# Patient Record
Sex: Male | Born: 1990 | Race: Black or African American | Hispanic: No | Marital: Single | Smoking: Current every day smoker
Health system: Southern US, Community
[De-identification: ages and names within clinical notes are randomized; demographics above are authoritative.]

## PROBLEM LIST (undated history)

## (undated) DIAGNOSIS — K3184 Gastroparesis: Secondary | ICD-10-CM

## (undated) DIAGNOSIS — E119 Type 2 diabetes mellitus without complications: Secondary | ICD-10-CM

## (undated) HISTORY — PX: SKIN GRAFT: SHX250

## (undated) HISTORY — PX: CHOLECYSTECTOMY: SHX55

---

## 2016-08-30 ENCOUNTER — Encounter (HOSPITAL_COMMUNITY): Payer: Self-pay | Admitting: *Deleted

## 2016-08-30 ENCOUNTER — Emergency Department (HOSPITAL_COMMUNITY)
Admission: EM | Admit: 2016-08-30 | Discharge: 2016-08-30 | Disposition: A | Discharge status: Home / Self Care | Payer: Federal, State, Local not specified - PPO | Attending: Emergency Medicine | Admitting: Emergency Medicine

## 2016-08-30 DIAGNOSIS — E0843 Diabetes mellitus due to underlying condition with diabetic autonomic (poly)neuropathy: Secondary | ICD-10-CM | POA: Diagnosis not present

## 2016-08-30 DIAGNOSIS — K3184 Gastroparesis: Secondary | ICD-10-CM

## 2016-08-30 DIAGNOSIS — R112 Nausea with vomiting, unspecified: Secondary | ICD-10-CM | POA: Diagnosis not present

## 2016-08-30 DIAGNOSIS — F17210 Nicotine dependence, cigarettes, uncomplicated: Secondary | ICD-10-CM | POA: Insufficient documentation

## 2016-08-30 DIAGNOSIS — R109 Unspecified abdominal pain: Secondary | ICD-10-CM | POA: Diagnosis present

## 2016-08-30 HISTORY — DX: Type 2 diabetes mellitus without complications: E11.9

## 2016-08-30 LAB — COMPREHENSIVE METABOLIC PANEL
ALBUMIN: 4.6 g/dL (ref 3.5–5.0)
ALK PHOS: 72 U/L (ref 38–126)
ALT: 15 U/L — AB (ref 17–63)
AST: 22 U/L (ref 15–41)
Anion gap: 11 (ref 5–15)
BILIRUBIN TOTAL: 1.3 mg/dL — AB (ref 0.3–1.2)
BUN: 9 mg/dL (ref 6–20)
CO2: 25 mmol/L (ref 22–32)
CREATININE: 0.91 mg/dL (ref 0.61–1.24)
Calcium: 9.6 mg/dL (ref 8.9–10.3)
Chloride: 97 mmol/L — ABNORMAL LOW (ref 101–111)
GFR calc Af Amer: >60 mL/min (ref >60)
GLUCOSE: 239 mg/dL — AB (ref 65–99)
Potassium: 3.7 mmol/L (ref 3.5–5.1)
Sodium: 133 mmol/L — ABNORMAL LOW (ref 135–145)
TOTAL PROTEIN: 7.6 g/dL (ref 6.5–8.1)

## 2016-08-30 LAB — URINALYSIS, ROUTINE W REFLEX MICROSCOPIC
BILIRUBIN URINE: NEGATIVE
GLUCOSE, UA: 150 mg/dL — AB
Hgb urine dipstick: NEGATIVE
KETONES UR: 5 mg/dL — AB
LEUKOCYTES UA: NEGATIVE
NITRITE: NEGATIVE
PH: 6 (ref 5.0–8.0)
PROTEIN: NEGATIVE mg/dL
Specific Gravity, Urine: 1.004 — ABNORMAL LOW (ref 1.005–1.030)

## 2016-08-30 LAB — CBC
HCT: 40.4 % (ref 39.0–52.0)
Hemoglobin: 14.3 g/dL (ref 13.0–17.0)
MCH: 26.3 pg (ref 26.0–34.0)
MCHC: 35.4 g/dL (ref 30.0–36.0)
MCV: 74.3 fL — ABNORMAL LOW (ref 78.0–100.0)
PLATELETS: 242 10*3/uL (ref 150–400)
RBC: 5.44 MIL/uL (ref 4.22–5.81)
RDW: 13.7 % (ref 11.5–15.5)
WBC: 8.1 10*3/uL (ref 4.0–10.5)

## 2016-08-30 LAB — CBG MONITORING, ED: Glucose-Capillary: 231 mg/dL — ABNORMAL HIGH (ref 65–99)

## 2016-08-30 LAB — LIPASE, BLOOD: Lipase: <10 U/L — ABNORMAL LOW (ref 11–51)

## 2016-08-30 MED ORDER — METOCLOPRAMIDE HCL 5 MG/ML IJ SOLN
10.0000 mg | Freq: Once | INTRAMUSCULAR | Status: AC
  Administered 2016-08-30: 10 mg via INTRAVENOUS
  Filled 2016-08-30: qty 2

## 2016-08-30 MED ORDER — HALOPERIDOL LACTATE 5 MG/ML IJ SOLN
2.5000 mg | Freq: Once | INTRAMUSCULAR | Status: AC
  Administered 2016-08-30: 2.5 mg via INTRAVENOUS
  Filled 2016-08-30: qty 1

## 2016-08-30 MED ORDER — PROMETHAZINE HCL 25 MG PO TABS: 25.0000 mg | ORAL_TABLET | Freq: Four times a day (QID) | ORAL | 0 refills | Status: DC | PRN

## 2016-08-30 MED ORDER — SODIUM CHLORIDE 0.9 % IV BOLUS (SEPSIS)
1000.0000 mL | Freq: Once | INTRAVENOUS | Status: AC
  Administered 2016-08-30: 1000 mL via INTRAVENOUS

## 2016-08-30 MED ORDER — SODIUM CHLORIDE 0.9 % IV BOLUS (SEPSIS)
1000.0000 mL | Freq: Once | INTRAVENOUS | Status: AC
Start: 2016-08-30 — End: 2016-08-30
  Administered 2016-08-30: 1000 mL via INTRAVENOUS

## 2016-08-30 MED ORDER — ONDANSETRON 4 MG PO TBDP
4.0000 mg | ORAL_TABLET | Freq: Once | ORAL | Status: AC | PRN
  Administered 2016-08-30: 4 mg via ORAL

## 2016-08-30 MED ORDER — ONDANSETRON 4 MG PO TBDP
ORAL_TABLET | ORAL | Status: AC
  Filled 2016-08-30: qty 1

## 2016-08-30 NOTE — ED Notes (Signed)
Pt verbalized understanding of d/c instructions and has no further questions. Pt stable and NAD. VSS.  

## 2016-08-30 NOTE — ED Triage Notes (Addendum)
Pt c/o generalized abd pain onset x 2 days, pt reports n/v with x 30 episodes of vomiting in the last 24 hrs, pt denies diarrhea, pt has type 1 Diabetes , A&O x4

## 2016-08-30 NOTE — ED Provider Notes (Signed)
[facility]PT Provider Note   CSN: [id number] Arrival date & time: 08/30/16  1705     History   Chief Complaint Chief Complaint  Patient presents with  . Abdominal Pain    HPI Christopher Callahan is a 26 y.o. male.  HPI  26 year old male who presents with nausea, vomiting, and abdominal pain. He has a history of type 1 diabetes complicated by recurrent episodes of nausea and vomiting in the past. His mother states that he has been worked up for gastroparesis in the past, but has never gotten the diagnosis of forehead it. States that he has been doing well, and yesterday began to have nausea and vomiting and diffuse generalized abdominal pain that is consistent with his prior episodes. I he had a normal bowel movement today. Has a history of a cholecystectomy. Has not had abdominal distention, melena or hematochezia, or hematemesis. He has not had fevers, chills, dysuria or urinary frequency. He denies any chest pain, difficulty breathing, cough or congestion.  Past Medical History:  Diagnosis Date  . Diabetes mellitus without complication (HCC)     There are no active problems to display for this patient.   Past Surgical History:  Procedure Laterality Date  . CHOLECYSTECTOMY    . SKIN GRAFT         Home Medications    Prior to Admission medications   Medication Sig Start Date End Date Taking? Authorizing Provider  promethazine (PHENERGAN) 25 MG tablet Take 1 tablet (25 mg total) by mouth every 6 (six) hours as needed for nausea or vomiting. 08/30/16   [name], MD    Family History No family history on file.  Social History Social History  Substance Use Topics  . Smoking status: Current Every Day Smoker    Packs/day: 0.30    Types: Cigarettes  . Smokeless tobacco: Never Used  . Alcohol use No     Allergies   Patient has no known allergies.   Review of Systems Review of Systems  Constitutional: Negative for fever.  HENT: Negative for congestion.     Respiratory: Negative for shortness of breath.   Cardiovascular: Negative for chest pain.  Gastrointestinal: Positive for abdominal pain, nausea and vomiting.  Genitourinary: Negative for dysuria and frequency.  Musculoskeletal: Negative for back pain.  Neurological: Negative for syncope.  Hematological: Does not bruise/bleed easily.  Psychiatric/Behavioral: Negative for confusion.  All other systems reviewed and are negative.    Physical Exam Updated Vital Signs BP 140/88 (BP Location: Right Arm)   Pulse (!) 105   Temp 98.5 F (36.9 C) (Oral)   Resp 16   Ht  (1.854 m)   Wt 221 lb (100.2 kg)   SpO2 98%   BMI 29.16 kg/m   Physical Exam Physical Exam  Nursing note and vitals reviewed. Constitutional: non-toxic, appears uncomfortable, sitting on edge of the bed leaning forward Head: Normocephalic and atraumatic.  Mouth/Throat: Oropharynx is clear and dry mucous membranes.  Neck: Normal range of motion. Neck supple.  Cardiovascular: Tachycardic rate and regular rhythm.   Pulmonary/Chest: Effort normal and breath sounds normal.  Abdominal: Soft. There is diffuse generalized tenderness. There is no rebound and no guarding.  Musculoskeletal: Normal range of motion.  Neurological: Alert, no facial droop, fluent speech, moves all extremities symmetrically Skin: Skin is warm and dry.  Psychiatric: Cooperative   ED Treatments / Results  Labs (all labs ordered are listed, but only abnormal results are displayed) Labs Reviewed  LIPASE, BLOOD -  Abnormal; Notable for the following:       Result Value   Lipase <10 (*)    All other components within normal limits  COMPREHENSIVE METABOLIC PANEL - Abnormal; Notable for the following:    Sodium 133 (*)    Chloride 97 (*)    Glucose, Bld 239 (*)    ALT 15 (*)    Total Bilirubin 1.3 (*)    All other components within normal limits  CBC - Abnormal; Notable for the following:    MCV 74.3 (*)    All other components within  normal limits  URINALYSIS, ROUTINE W REFLEX MICROSCOPIC - Abnormal; Notable for the following:    Color, Urine STRAW (*)    Specific Gravity, Urine 1.004 (*)    Glucose, UA 150 (*)    Ketones, ur 5 (*)    All other components within normal limits  CBG MONITORING, ED - Abnormal; Notable for the following:    Glucose-Capillary 231 (*)    All other components within normal limits    EKG  EKG Interpretation  Date/Time:  Friday August 30 2016 19:30:50 EDT Ventricular Rate:  111 PR Interval:    QRS Duration: 84 QT Interval:  333 QTC Calculation: 453 R Axis:   56 Text Interpretation:  Sinus tachycardia Baseline wander in lead(s) V3 V4 no prior EKG  Confirmed by Nihar Klus MD, Baudelia Schroepfer ([id number]) on 08/30/2016 7:43:23 PM       Radiology No results found.  Procedures Procedures (including critical care time)  Medications Ordered in ED Medications  ondansetron (ZOFRAN-ODT) disintegrating tablet 4 mg (4 mg Oral Given 08/30/16 1752)  sodium chloride 0.9 % bolus 1,000 mL (0 mLs Intravenous Stopped 08/30/16 2132)  sodium chloride 0.9 % bolus 1,000 mL (0 mLs Intravenous Stopped 08/30/16 2132)  metoCLOPramide (REGLAN) injection 10 mg (10 mg Intravenous Given 08/30/16 1935)  haloperidol lactate (HALDOL) injection 2.5 mg (2.5 mg Intravenous Given 08/30/16 1939)  sodium chloride 0.9 % bolus 1,000 mL (0 mLs Intravenous Stopped 08/30/16 2232)     Initial Impression / Assessment and Plan / ED Course  I have reviewed the triage vital signs and the nursing notes.  Pertinent labs & imaging results that were available during my care of the patient were reviewed by me and considered in my medical decision making (see chart for details).     Presenting with nausea and vomiting which he states is similar to prior episodes in the past. Likely gastroparesis. He appears dry on exam with sinus tachycardia. Abdomen is soft and nonsurgical, with diffuse tenderness. Blood work with signs of mild dehydration. He does have  hyperglycemia to 239, but without signs of DKA. He is given IV fluids, Reglan, and Haldol. On reevaluation, he feels significantly improved. Repeat abdominal exam is benign. I do not feel that his presentation is consistent with serious intra-abdominal process such as appendicitis, colitis, or obstruction. UA w/o signs of infection. He has been able to drink fluids and eat crackers, and states that he is ready for discharge home. Strict return and follow-up instructions reviewed. He expressed understanding of all discharge instructions and felt comfortable with the plan of care.   Final Clinical Impressions(s) / ED Diagnoses   Final diagnoses:  Non-intractable vomiting with nausea, unspecified vomiting type  Gastroparesis    New Prescriptions Discharge Medication List as of 08/30/2016 10:20 PM    START taking these medications   Details  promethazine (PHENERGAN) 25 MG tablet Take 1 tablet (25 mg total) by mouth every  6 (six) hours as needed for nausea or vomiting., Starting Fri 08/30/2016, Print         [name], MD 08/30/16 [phone]

## 2016-08-30 NOTE — ED Notes (Signed)
Pt stated unable to provide urine sample at this time. Urinal and call light at bedside.  Family also at bedside.

## 2016-08-30 NOTE — Discharge Instructions (Signed)
Return without fail for worsening symptoms, including fever, worsening pain, intractable vomiting or any other symptoms concerning to you.

## 2016-09-01 ENCOUNTER — Emergency Department (HOSPITAL_COMMUNITY): Payer: Federal, State, Local not specified - PPO

## 2016-09-01 ENCOUNTER — Encounter (HOSPITAL_COMMUNITY): Payer: Self-pay | Admitting: *Deleted

## 2016-09-01 ENCOUNTER — Emergency Department (HOSPITAL_COMMUNITY)
Admission: EM | Admit: 2016-09-01 | Discharge: 2016-09-01 | Disposition: A | Discharge status: Home / Self Care | Payer: Federal, State, Local not specified - PPO | Attending: Physician Assistant | Admitting: Physician Assistant

## 2016-09-01 DIAGNOSIS — E1143 Type 2 diabetes mellitus with diabetic autonomic (poly)neuropathy: Secondary | ICD-10-CM | POA: Diagnosis not present

## 2016-09-01 DIAGNOSIS — R1084 Generalized abdominal pain: Secondary | ICD-10-CM | POA: Diagnosis present

## 2016-09-01 DIAGNOSIS — F17210 Nicotine dependence, cigarettes, uncomplicated: Secondary | ICD-10-CM | POA: Diagnosis not present

## 2016-09-01 DIAGNOSIS — Z794 Long term (current) use of insulin: Secondary | ICD-10-CM | POA: Diagnosis not present

## 2016-09-01 DIAGNOSIS — K3184 Gastroparesis: Secondary | ICD-10-CM

## 2016-09-01 LAB — CBC WITH DIFFERENTIAL/PLATELET
Basophils Absolute: 0.1 10*3/uL (ref 0.0–0.1)
Basophils Relative: 1 %
EOS PCT: 1 %
Eosinophils Absolute: 0.1 10*3/uL (ref 0.0–0.7)
HEMATOCRIT: 38.7 % — AB (ref 39.0–52.0)
HEMOGLOBIN: 13.9 g/dL (ref 13.0–17.0)
LYMPHS ABS: 2 10*3/uL (ref 0.7–4.0)
LYMPHS PCT: 34 %
MCH: 26.4 pg (ref 26.0–34.0)
MCHC: 35.9 g/dL (ref 30.0–36.0)
MCV: 73.6 fL — AB (ref 78.0–100.0)
MONOS PCT: 8 %
Monocytes Absolute: 0.5 10*3/uL (ref 0.1–1.0)
Neutro Abs: 3.1 10*3/uL (ref 1.7–7.7)
Neutrophils Relative %: 56 %
Platelets: 189 10*3/uL (ref 150–400)
RBC: 5.26 MIL/uL (ref 4.22–5.81)
RDW: 13.9 % (ref 11.5–15.5)
WBC: 5.8 10*3/uL (ref 4.0–10.5)

## 2016-09-01 LAB — COMPREHENSIVE METABOLIC PANEL
ALBUMIN: 4.7 g/dL (ref 3.5–5.0)
ALT: 13 U/L — ABNORMAL LOW (ref 17–63)
AST: 20 U/L (ref 15–41)
Alkaline Phosphatase: 67 U/L (ref 38–126)
Anion gap: 13 (ref 5–15)
BILIRUBIN TOTAL: 1.4 mg/dL — AB (ref 0.3–1.2)
BUN: 9 mg/dL (ref 6–20)
CALCIUM: 9.8 mg/dL (ref 8.9–10.3)
CO2: 21 mmol/L — AB (ref 22–32)
CREATININE: 0.71 mg/dL (ref 0.61–1.24)
Chloride: 100 mmol/L — ABNORMAL LOW (ref 101–111)
Glucose, Bld: 257 mg/dL — ABNORMAL HIGH (ref 65–99)
Potassium: 3.3 mmol/L — ABNORMAL LOW (ref 3.5–5.1)
SODIUM: 134 mmol/L — AB (ref 135–145)
Total Protein: 7.4 g/dL (ref 6.5–8.1)

## 2016-09-01 LAB — LIPASE, BLOOD: Lipase: <10 U/L — ABNORMAL LOW (ref 11–51)

## 2016-09-01 LAB — I-STAT CHEM 8, ED
BUN: 10 mg/dL (ref 6–20)
CHLORIDE: 100 mmol/L — AB (ref 101–111)
CREATININE: 0.6 mg/dL — AB (ref 0.61–1.24)
Calcium, Ion: 1.11 mmol/L — ABNORMAL LOW (ref 1.15–1.40)
GLUCOSE: 262 mg/dL — AB (ref 65–99)
HEMATOCRIT: 39 % (ref 39.0–52.0)
Hemoglobin: 13.3 g/dL (ref 13.0–17.0)
POTASSIUM: 3.3 mmol/L — AB (ref 3.5–5.1)
Sodium: 136 mmol/L (ref 135–145)
TCO2: 22 mmol/L (ref 0–100)

## 2016-09-01 MED ORDER — DICYCLOMINE HCL 20 MG PO TABS: 20.0000 mg | ORAL_TABLET | Freq: Two times a day (BID) | ORAL | 0 refills | Status: DC | PRN

## 2016-09-01 MED ORDER — SODIUM CHLORIDE 0.9 % IV BOLUS (SEPSIS)
1000.0000 mL | Freq: Once | INTRAVENOUS | Status: AC
  Administered 2016-09-01: 1000 mL via INTRAVENOUS

## 2016-09-01 MED ORDER — PROMETHAZINE HCL 25 MG/ML IJ SOLN
25.0000 mg | Freq: Once | INTRAMUSCULAR | Status: AC
Start: 2016-09-01 — End: 2016-09-01
  Administered 2016-09-01: 25 mg via INTRAVENOUS
  Filled 2016-09-01: qty 1

## 2016-09-01 MED ORDER — HALOPERIDOL LACTATE 5 MG/ML IJ SOLN
2.0000 mg | Freq: Once | INTRAMUSCULAR | Status: AC
  Administered 2016-09-01: 2 mg via INTRAVENOUS
  Filled 2016-09-01: qty 1

## 2016-09-01 MED ORDER — METOCLOPRAMIDE HCL 5 MG/ML IJ SOLN
10.0000 mg | Freq: Once | INTRAMUSCULAR | Status: AC
  Administered 2016-09-01: 10 mg via INTRAVENOUS
  Filled 2016-09-01: qty 2

## 2016-09-01 MED ORDER — LISINOPRIL 10 MG PO TABS
5.0000 mg | ORAL_TABLET | Freq: Once | ORAL | Status: AC
  Administered 2016-09-01: 5 mg via ORAL
  Filled 2016-09-01: qty 1

## 2016-09-01 MED ORDER — MORPHINE SULFATE (PF) 4 MG/ML IV SOLN
4.0000 mg | Freq: Once | INTRAVENOUS | Status: AC
Start: 2016-09-01 — End: 2016-09-01
  Administered 2016-09-01: 4 mg via INTRAVENOUS
  Filled 2016-09-01: qty 1

## 2016-09-01 MED ORDER — MORPHINE SULFATE (PF) 4 MG/ML IV SOLN
4.0000 mg | Freq: Once | INTRAVENOUS | Status: AC
  Administered 2016-09-01: 4 mg via INTRAVENOUS
  Filled 2016-09-01: qty 1

## 2016-09-01 NOTE — ED Notes (Signed)
Pt provided with water

## 2016-09-01 NOTE — ED Provider Notes (Signed)
[facility] Provider Note   CSN: [id number] Arrival date & time: 09/01/16  0845     History   Chief Complaint Chief Complaint  Patient presents with  . Abdominal Pain  . Emesis    HPI Christopher Callahan is a 26 y.o. male.  HPI   Pt with hx Type 1 DM, DKA,  possible gastroparesis p/w 3 days of diffuse abdominal pain, uncontrolled nausea/vomiting, not tolerating PO.  Has similar episodes every 2 months. Has not been checking his blood sugars.  Has vomited 6 times today.  No BM since two days ago, did pass flatus last night.  Denies fevers, chest pain, SOB, cough, dysuria.  Hx abdominal surgery - cholecystectomy in 2014.  Was seen in ED 08/30/16 for similar symptoms, was given IVF, reglan, haldol with relief but symptoms returned within hours of returning home.  Has tried phenergan, reglan, and zofran at home without relief.  Pt denies using marijuana for some time.   Past Medical History:  Diagnosis Date  . Diabetes mellitus without complication (HCC)       There are no active problems to display for this patient.   Past Surgical History:  Procedure Laterality Date  . CHOLECYSTECTOMY    . SKIN GRAFT         Home Medications    Prior to Admission medications   Medication Sig Start Date End Date Taking? Authorizing Provider  amoxicillin-clavulanate (AUGMENTIN) 875-125 MG tablet Take 1 tablet by mouth 2 (two) times daily. 08/26/16  Yes Historical Provider, MD  ferrous sulfate 325 (65 FE) MG tablet Take 325 mg by mouth daily with breakfast.   Yes Historical Provider, MD  insulin aspart (NOVOLOG) 100 UNIT/ML FlexPen Inject 5 Units into the skin 3 (three) times daily. 07/10/16  Yes Historical Provider, MD  Insulin Detemir (LEVEMIR FLEXTOUCH) 100 UNIT/ML Pen Inject 24 Units into the skin 2 (two) times daily. 07/10/16  Yes Historical Provider, MD  lisinopril (PRINIVIL,ZESTRIL) 5 MG tablet Take 5 mg by mouth daily. 07/10/16  Yes Historical Provider, MD  promethazine (PHENERGAN) 25  MG tablet Take 1 tablet (25 mg total) by mouth every 6 (six) hours as needed for nausea or vomiting. 08/30/16  Yes [name], MD  dicyclomine (BENTYL) 20 MG tablet Take 1 tablet (20 mg total) by mouth 2 (two) times daily as needed for spasms (abdominal pain). 09/01/16   [name], PA-C    Family History No family history on file.  Social History Social History  Substance Use Topics  . Smoking status: Current Every Day Smoker    Packs/day: 0.30    Types: Cigarettes  . Smokeless tobacco: Never Used  . Alcohol use No     Allergies   Patient has no known allergies.   Review of Systems Review of Systems  All other systems reviewed and are negative.    Physical Exam Updated Vital Signs BP 134/90 (BP Location: Left Arm)   Pulse 94   Temp 98.3 F (36.8 C) (Oral)   Resp 16   Ht  (1.854 m)   Wt 100.2 kg   SpO2 99%   BMI 29.16 kg/m   Physical Exam  Constitutional: He appears well-developed and well-nourished. No distress.  HENT:  Head: Normocephalic and atraumatic.  Neck: Neck supple.  Cardiovascular: Normal rate and regular rhythm.   Pulmonary/Chest: Effort normal and breath sounds normal. No respiratory distress. He has no wheezes. He has no rales.  Abdominal: Soft. He exhibits no distension and no mass.  There is tenderness (diffuse ). There is no rebound and no guarding.  Neurological: He is alert. He exhibits normal muscle tone.  Skin: He is not diaphoretic.  Nursing note and vitals reviewed.    ED Treatments / Results  Labs (all labs ordered are listed, but only abnormal results are displayed) Labs Reviewed  COMPREHENSIVE METABOLIC PANEL - Abnormal; Notable for the following:       Result Value   Sodium 134 (*)    Potassium 3.3 (*)    Chloride 100 (*)    CO2 21 (*)    Glucose, Bld 257 (*)    ALT 13 (*)    Total Bilirubin 1.4 (*)    All other components within normal limits  LIPASE, BLOOD - Abnormal; Notable for the following:    Lipase <10 (*)     All other components within normal limits  CBC WITH DIFFERENTIAL/PLATELET - Abnormal; Notable for the following:    HCT 38.7 (*)    MCV 73.6 (*)    All other components within normal limits  I-STAT CHEM 8, ED - Abnormal; Notable for the following:    Potassium 3.3 (*)    Chloride 100 (*)    Creatinine, Ser 0.60 (*)    Glucose, Bld 262 (*)    Calcium, Ion 1.11 (*)    All other components within normal limits  URINALYSIS, ROUTINE W REFLEX MICROSCOPIC    EKG  EKG Interpretation None       Radiology Dg Abdomen Acute W/chest  Result Date: 09/01/2016 CLINICAL DATA:  Nausea, vomiting for 2 days.  Sharp abdominal pain. EXAM: DG ABDOMEN ACUTE W/ 1V CHEST COMPARISON:  None. FINDINGS: There is no evidence of dilated bowel loops or free intraperitoneal air. Moderate amount of stool throughout the colon. No radiopaque calculi or other significant radiographic abnormality is seen. Heart size and mediastinal contours are within normal limits. Both lungs are clear. No acute osseous abnormality. IMPRESSION: Negative abdominal radiographs.  No acute cardiopulmonary disease. Electronically Signed   By: Elige Ko   On: 09/01/2016 09:57    Procedures Procedures (including critical care time)  Medications Ordered in ED Medications  morphine 4 MG/ML injection 4 mg (4 mg Intravenous Given 09/01/16 0930)  sodium chloride 0.9 % bolus 1,000 mL (0 mLs Intravenous Stopped 09/01/16 1117)  metoCLOPramide (REGLAN) injection 10 mg (10 mg Intravenous Given 09/01/16 0932)  morphine 4 MG/ML injection 4 mg (4 mg Intravenous Given 09/01/16 1127)  sodium chloride 0.9 % bolus 1,000 mL (0 mLs Intravenous Stopped 09/01/16 1447)  promethazine (PHENERGAN) injection 25 mg (25 mg Intravenous Given 09/01/16 1129)  haloperidol lactate (HALDOL) injection 2 mg (2 mg Intravenous Given 09/01/16 1236)  lisinopril (PRINIVIL,ZESTRIL) tablet 5 mg (5 mg Oral Given 09/01/16 1234)     Initial Impression / Assessment and Plan / ED Course  I  have reviewed the triage vital signs and the nursing notes.  Pertinent labs & imaging results that were available during my care of the patient were reviewed by me and considered in my medical decision making (see chart for details).  Clinical Course as of Sep 02 1502  Sun Sep 01, 2016  1107 Reexamination of abdomen is unchanged.  Pt remains uncomfortable, tachycardic, hypertensive.  Nauseated.  Will continue to treat.    [EW]  1440 Patient reports he is feeling better.  Blood pressure is improved.  Tolerating PO.    [EW]    Clinical Course User Index [EW] [name], PA-C  Afebrile, nontoxic patient with hx diabetes, possible gastroparesis, possible canabanoid hyperemesis p/w diffuse abdominal pain, N/V.  This occurs Q 2 months and is similar to prior events.  Great improvement with IV medications, IVF.  Tolerating PO.  D/C home with PCP, GI follow up.  Discussed result, findings, treatment, and follow up  with patient.  Pt given return precautions.  Pt verbalizes understanding and agrees with plan.       Final Clinical Impressions(s) / ED Diagnoses   Final diagnoses:  Gastroparesis    New Prescriptions Discharge Medication List as of 09/01/2016  2:43 PM    START taking these medications   Details  dicyclomine (BENTYL) 20 MG tablet Take 1 tablet (20 mg total) by mouth 2 (two) times daily as needed for spasms (abdominal pain)., Starting Sun 09/01/2016, Print         [place], PA-C 09/01/16 1505    Courteney Randall An, MD 09/02/16 [phone]

## 2016-09-01 NOTE — Discharge Instructions (Signed)
Read the information below.  Use the prescribed medication as directed.  Please discuss all new medications with your pharmacist.  You may return to the Emergency Department at any time for worsening condition or any new symptoms that concern you.   If you develop high fevers, worsening abdominal pain, uncontrolled vomiting, or are unable to tolerate fluids by mouth, return to the ER for a recheck.  ° °

## 2016-09-01 NOTE — ED Triage Notes (Signed)
Pt was seen here  On Fri for abdominal pain and emesis.  Was discharged home, but has seen no real improvement.

## 2016-09-02 ENCOUNTER — Encounter (HOSPITAL_COMMUNITY): Payer: Self-pay | Admitting: Emergency Medicine

## 2016-09-02 DIAGNOSIS — F17210 Nicotine dependence, cigarettes, uncomplicated: Secondary | ICD-10-CM | POA: Insufficient documentation

## 2016-09-02 DIAGNOSIS — Z794 Long term (current) use of insulin: Secondary | ICD-10-CM | POA: Diagnosis not present

## 2016-09-02 DIAGNOSIS — K3184 Gastroparesis: Secondary | ICD-10-CM | POA: Diagnosis not present

## 2016-09-02 DIAGNOSIS — E1043 Type 1 diabetes mellitus with diabetic autonomic (poly)neuropathy: Secondary | ICD-10-CM | POA: Diagnosis not present

## 2016-09-02 DIAGNOSIS — R109 Unspecified abdominal pain: Secondary | ICD-10-CM | POA: Diagnosis present

## 2016-09-02 DIAGNOSIS — Z79899 Other long term (current) drug therapy: Secondary | ICD-10-CM | POA: Diagnosis not present

## 2016-09-02 DIAGNOSIS — E876 Hypokalemia: Secondary | ICD-10-CM | POA: Diagnosis not present

## 2016-09-02 DIAGNOSIS — I10 Essential (primary) hypertension: Secondary | ICD-10-CM | POA: Diagnosis not present

## 2016-09-02 DIAGNOSIS — E1065 Type 1 diabetes mellitus with hyperglycemia: Secondary | ICD-10-CM | POA: Diagnosis not present

## 2016-09-02 LAB — URINALYSIS, ROUTINE W REFLEX MICROSCOPIC
BILIRUBIN URINE: NEGATIVE
Glucose, UA: 50 mg/dL — AB
HGB URINE DIPSTICK: NEGATIVE
KETONES UR: NEGATIVE mg/dL
Leukocytes, UA: NEGATIVE
NITRITE: NEGATIVE
PH: 7 (ref 5.0–8.0)
Protein, ur: NEGATIVE mg/dL
SPECIFIC GRAVITY, URINE: 1.003 — AB (ref 1.005–1.030)

## 2016-09-02 LAB — CBC
HEMATOCRIT: 35.6 % — AB (ref 39.0–52.0)
HEMOGLOBIN: 12.5 g/dL — AB (ref 13.0–17.0)
MCH: 26.2 pg (ref 26.0–34.0)
MCHC: 35.1 g/dL (ref 30.0–36.0)
MCV: 74.6 fL — ABNORMAL LOW (ref 78.0–100.0)
Platelets: 191 10*3/uL (ref 150–400)
RBC: 4.77 MIL/uL (ref 4.22–5.81)
RDW: 13.9 % (ref 11.5–15.5)
WBC: 5.7 10*3/uL (ref 4.0–10.5)

## 2016-09-02 LAB — COMPREHENSIVE METABOLIC PANEL
ALT: 12 U/L — ABNORMAL LOW (ref 17–63)
ANION GAP: 8 (ref 5–15)
AST: 16 U/L (ref 15–41)
Albumin: 4.3 g/dL (ref 3.5–5.0)
Alkaline Phosphatase: 60 U/L (ref 38–126)
BILIRUBIN TOTAL: 1.2 mg/dL (ref 0.3–1.2)
CO2: 26 mmol/L (ref 22–32)
Calcium: 9.6 mg/dL (ref 8.9–10.3)
Chloride: 104 mmol/L (ref 101–111)
Creatinine, Ser: 0.72 mg/dL (ref 0.61–1.24)
GFR calc Af Amer: >60 mL/min (ref >60)
Glucose, Bld: 194 mg/dL — ABNORMAL HIGH (ref 65–99)
POTASSIUM: 3.1 mmol/L — AB (ref 3.5–5.1)
Sodium: 138 mmol/L (ref 135–145)
TOTAL PROTEIN: 7.1 g/dL (ref 6.5–8.1)

## 2016-09-02 LAB — LIPASE, BLOOD: Lipase: <10 U/L — ABNORMAL LOW (ref 11–51)

## 2016-09-02 NOTE — ED Triage Notes (Signed)
Pt presents to ED c/o generalized abd pain, N/V since Wednesday. Pt states he gets these episodes about once every 2-3 months but it goes away after a day, sts this episode hasn't gone away. He reports his MD thinks it is gastroparesis, has taken phenergan at home without relief. Pt denies fevers/diarrhea.

## 2016-09-03 ENCOUNTER — Observation Stay (HOSPITAL_COMMUNITY)
Admission: EM | Admit: 2016-09-03 | Discharge: 2016-09-04 | Disposition: A | Discharge status: Home / Self Care | Payer: Federal, State, Local not specified - PPO | Attending: Internal Medicine | Admitting: Internal Medicine

## 2016-09-03 ENCOUNTER — Emergency Department (HOSPITAL_COMMUNITY): Payer: Federal, State, Local not specified - PPO

## 2016-09-03 DIAGNOSIS — E1143 Type 2 diabetes mellitus with diabetic autonomic (poly)neuropathy: Secondary | ICD-10-CM | POA: Diagnosis not present

## 2016-09-03 DIAGNOSIS — K3184 Gastroparesis: Secondary | ICD-10-CM

## 2016-09-03 DIAGNOSIS — E1043 Type 1 diabetes mellitus with diabetic autonomic (poly)neuropathy: Secondary | ICD-10-CM | POA: Diagnosis not present

## 2016-09-03 DIAGNOSIS — E108 Type 1 diabetes mellitus with unspecified complications: Secondary | ICD-10-CM

## 2016-09-03 DIAGNOSIS — I10 Essential (primary) hypertension: Secondary | ICD-10-CM | POA: Diagnosis present

## 2016-09-03 DIAGNOSIS — E1065 Type 1 diabetes mellitus with hyperglycemia: Secondary | ICD-10-CM | POA: Diagnosis present

## 2016-09-03 LAB — GLUCOSE, CAPILLARY
GLUCOSE-CAPILLARY: 171 mg/dL — AB (ref 65–99)
Glucose-Capillary: 190 mg/dL — ABNORMAL HIGH (ref 65–99)
Glucose-Capillary: 118 mg/dL — ABNORMAL HIGH (ref 65–99)

## 2016-09-03 LAB — RAPID URINE DRUG SCREEN, HOSP PERFORMED
Amphetamines: NOT DETECTED
Barbiturates: NOT DETECTED
Benzodiazepines: POSITIVE — AB
Cocaine: NOT DETECTED
OPIATES: POSITIVE — AB
Tetrahydrocannabinol: NOT DETECTED

## 2016-09-03 LAB — HIV ANTIBODY (ROUTINE TESTING W REFLEX): HIV SCREEN 4TH GENERATION: NONREACTIVE

## 2016-09-03 MED ORDER — MORPHINE SULFATE (PF) 4 MG/ML IV SOLN
4.0000 mg | Freq: Once | INTRAVENOUS | Status: AC
Start: 2016-09-03 — End: 2016-09-03
  Administered 2016-09-03: 4 mg via INTRAVENOUS
  Filled 2016-09-03: qty 1

## 2016-09-03 MED ORDER — INSULIN ASPART 100 UNIT/ML ~~LOC~~ SOLN
0.0000 [IU] | SUBCUTANEOUS | Status: DC
  Administered 2016-09-03 (×2): 2 [IU] via SUBCUTANEOUS

## 2016-09-03 MED ORDER — ACETAMINOPHEN 325 MG PO TABS
650.0000 mg | ORAL_TABLET | Freq: Four times a day (QID) | ORAL | Status: DC | PRN
  Administered 2016-09-03 (×2): 650 mg via ORAL
  Filled 2016-09-03 (×2): qty 2

## 2016-09-03 MED ORDER — ENOXAPARIN SODIUM 40 MG/0.4ML ~~LOC~~ SOLN
40.0000 mg | SUBCUTANEOUS | Status: DC
  Administered 2016-09-03 – 2016-09-04 (×2): 40 mg via SUBCUTANEOUS
  Filled 2016-09-03 (×2): qty 0.4

## 2016-09-03 MED ORDER — POTASSIUM CHLORIDE CRYS ER 20 MEQ PO TBCR
40.0000 meq | EXTENDED_RELEASE_TABLET | Freq: Once | ORAL | Status: AC
  Administered 2016-09-03: 40 meq via ORAL
  Filled 2016-09-03: qty 2

## 2016-09-03 MED ORDER — PROMETHAZINE HCL 25 MG PO TABS: 25.0000 mg | ORAL_TABLET | Freq: Four times a day (QID) | ORAL | Status: DC | PRN

## 2016-09-03 MED ORDER — METOCLOPRAMIDE HCL 5 MG/ML IJ SOLN
10.0000 mg | Freq: Four times a day (QID) | INTRAMUSCULAR | Status: DC
  Administered 2016-09-03 – 2016-09-04 (×5): 10 mg via INTRAVENOUS
  Filled 2016-09-03 (×5): qty 2

## 2016-09-03 MED ORDER — HYDRALAZINE HCL 20 MG/ML IJ SOLN: 10.0000 mg | INTRAMUSCULAR | Status: DC | PRN

## 2016-09-03 MED ORDER — PROMETHAZINE HCL 25 MG/ML IJ SOLN
25.0000 mg | Freq: Once | INTRAMUSCULAR | Status: AC
  Administered 2016-09-03: 25 mg via INTRAVENOUS
  Filled 2016-09-03: qty 1

## 2016-09-03 MED ORDER — TRAMADOL HCL 50 MG PO TABS
50.0000 mg | ORAL_TABLET | Freq: Four times a day (QID) | ORAL | Status: DC | PRN
  Administered 2016-09-03 – 2016-09-04 (×3): 50 mg via ORAL
  Filled 2016-09-03 (×3): qty 1

## 2016-09-03 MED ORDER — DICYCLOMINE HCL 20 MG PO TABS
20.0000 mg | ORAL_TABLET | Freq: Two times a day (BID) | ORAL | Status: DC | PRN
  Administered 2016-09-03: 20 mg via ORAL
  Filled 2016-09-03: qty 1

## 2016-09-03 MED ORDER — DIPHENHYDRAMINE HCL 50 MG/ML IJ SOLN
25.0000 mg | Freq: Once | INTRAMUSCULAR | Status: AC
  Administered 2016-09-03: 25 mg via INTRAVENOUS
  Filled 2016-09-03: qty 1

## 2016-09-03 MED ORDER — METOCLOPRAMIDE HCL 5 MG/ML IJ SOLN
10.0000 mg | Freq: Once | INTRAMUSCULAR | Status: AC
  Administered 2016-09-03: 10 mg via INTRAVENOUS
  Filled 2016-09-03: qty 2

## 2016-09-03 MED ORDER — NICOTINE 7 MG/24HR TD PT24
7.0000 mg | MEDICATED_PATCH | Freq: Every day | TRANSDERMAL | Status: DC
  Administered 2016-09-03 – 2016-09-04 (×2): 7 mg via TRANSDERMAL
  Filled 2016-09-03 (×2): qty 1

## 2016-09-03 MED ORDER — INSULIN DETEMIR 100 UNIT/ML ~~LOC~~ SOLN: 20.0000 [IU] | Freq: Two times a day (BID) | SUBCUTANEOUS | Status: DC

## 2016-09-03 MED ORDER — HYDROMORPHONE HCL 1 MG/ML IJ SOLN
1.0000 mg | Freq: Once | INTRAMUSCULAR | Status: AC
  Administered 2016-09-03: 1 mg via INTRAVENOUS
  Filled 2016-09-03: qty 1

## 2016-09-03 MED ORDER — INSULIN DETEMIR 100 UNIT/ML ~~LOC~~ SOLN
24.0000 [IU] | Freq: Every day | SUBCUTANEOUS | Status: DC
  Administered 2016-09-03 – 2016-09-04 (×2): 24 [IU] via SUBCUTANEOUS
  Filled 2016-09-03 (×2): qty 0.24

## 2016-09-03 MED ORDER — FERROUS SULFATE 325 (65 FE) MG PO TABS
325.0000 mg | ORAL_TABLET | Freq: Every day | ORAL | Status: DC
  Administered 2016-09-03 – 2016-09-04 (×2): 325 mg via ORAL
  Filled 2016-09-03 (×2): qty 1

## 2016-09-03 MED ORDER — SODIUM CHLORIDE 0.9 % IV BOLUS (SEPSIS)
1000.0000 mL | Freq: Once | INTRAVENOUS | Status: AC
  Administered 2016-09-03: 1000 mL via INTRAVENOUS

## 2016-09-03 MED ORDER — LISINOPRIL 10 MG PO TABS
5.0000 mg | ORAL_TABLET | Freq: Every day | ORAL | Status: DC
  Administered 2016-09-03 – 2016-09-04 (×2): 5 mg via ORAL
  Filled 2016-09-03 (×2): qty 1

## 2016-09-03 MED ORDER — KETOROLAC TROMETHAMINE 15 MG/ML IJ SOLN
15.0000 mg | Freq: Three times a day (TID) | INTRAMUSCULAR | Status: DC | PRN
  Administered 2016-09-03 – 2016-09-04 (×3): 15 mg via INTRAVENOUS
  Filled 2016-09-03 (×3): qty 1

## 2016-09-03 MED ORDER — LABETALOL HCL 5 MG/ML IV SOLN
10.0000 mg | Freq: Once | INTRAVENOUS | Status: AC
  Administered 2016-09-03: 10 mg via INTRAVENOUS
  Filled 2016-09-03: qty 4

## 2016-09-03 MED ORDER — SODIUM CHLORIDE 0.9 % IV SOLN
INTRAVENOUS | Status: DC
  Administered 2016-09-03 – 2016-09-04 (×4): via INTRAVENOUS

## 2016-09-03 NOTE — H&P (Signed)
History and Physical    Christopher Callahan ZOX:[id number] DOB: 1990-09-10 DOA: 09/03/2016  PCP: Jacquelynn Cree, PA-C  Patient coming from: Home  I have personally briefly reviewed patient's old medical records in [facility]  Chief Complaint: N/V  HPI: Christopher Callahan is a 26 y.o. male with medical history significant of DM1, history of probable diabetic gastroparesis with episodic N/V in past.  Presents to ED for 3rd visit in 4 days for uncontrolled N/V.  No resolution of symptoms despite multiple visits.  Patient not put on scheduled reglan at home at this point.  Symptoms not helped by home phenergan PO.   ED Course: Multiple rounds of narcotics, phenergan, and one of reglan given in ED.   Review of Systems: As per HPI otherwise 10 point review of systems negative.   Past Medical History:  Diagnosis Date  . Diabetes mellitus without complication [facility])     Past Surgical History:  Procedure Laterality Date  . CHOLECYSTECTOMY    . SKIN GRAFT       reports that he has been smoking Cigarettes.  He has been smoking about 0.30 packs per day. He has never used smokeless tobacco. He reports that he does not drink alcohol or use drugs.  No Known Allergies  No family history on file.   Prior to Admission medications   Medication Sig Start Date End Date Taking? Authorizing Provider  amoxicillin-clavulanate (AUGMENTIN) 875-125 MG tablet Take 1 tablet by mouth 2 (two) times daily. 08/26/16   Historical Provider, MD  dicyclomine (BENTYL) 20 MG tablet Take 1 tablet (20 mg total) by mouth 2 (two) times daily as needed for spasms (abdominal pain). 09/01/16   [name], PA-C  ferrous sulfate 325 (65 FE) MG tablet Take 325 mg by mouth daily with breakfast.    Historical Provider, MD  insulin aspart (NOVOLOG) 100 UNIT/ML FlexPen Inject 5 Units into the skin 3 (three) times daily. 07/10/16   Historical Provider, MD  Insulin Detemir (LEVEMIR FLEXTOUCH) 100 UNIT/ML Pen Inject 24 Units into the  skin 2 (two) times daily. 07/10/16   Historical Provider, MD  lisinopril (PRINIVIL,ZESTRIL) 5 MG tablet Take 5 mg by mouth daily. 07/10/16   Historical Provider, MD  promethazine (PHENERGAN) 25 MG tablet Take 1 tablet (25 mg total) by mouth every 6 (six) hours as needed for nausea or vomiting. 08/30/16   [name], MD    Physical Exam: Vitals:   09/03/16 0216 09/03/16 0321 09/03/16 0330 09/03/16 0400  BP: (!) 183/113 (!) 187/115 (!) 196/131 (!) 163/94  Pulse: (!) 104 (!) 105 (!) 123 99  Resp: 18 16    Temp:      TempSrc:      SpO2: 99% 97% 96% 97%  Weight:      Height:        Constitutional: NAD, calm, comfortable Eyes: PERRL, lids and conjunctivae normal ENMT: Mucous membranes are moist. Posterior pharynx clear of any exudate or lesions.Normal dentition.  Neck: normal, supple, no masses, no thyromegaly Respiratory: clear to auscultation bilaterally, no wheezing, no crackles. Normal respiratory effort. No accessory muscle use.  Cardiovascular: Regular rate and rhythm, no murmurs / rubs / gallops. No extremity edema. 2+ pedal pulses. No carotid bruits.  Abdomen: no tenderness, no masses palpated. No hepatosplenomegaly. Bowel sounds positive.  Musculoskeletal: no clubbing / cyanosis. No joint deformity upper and lower extremities. Good ROM, no contractures. Normal muscle tone.  Skin: no rashes, lesions, ulcers. No induration Neurologic: CN 2-12 grossly intact. Sensation intact,  DTR normal. Strength 5/5 in all 4.  Psychiatric: Normal judgment and insight. Alert and oriented x 3. Normal mood.    Labs on Admission: I have personally reviewed following labs and imaging studies  CBC:  Recent Labs Lab 08/30/16 1751 09/01/16 0940 09/01/16 0949 09/02/16 1959  WBC 8.1 5.8  --  5.7  NEUTROABS  --  3.1  --   --   HGB 14.3 13.9 13.3 12.5*  HCT 40.4 38.7* 39.0 35.6*  MCV 74.3* 73.6*  --  74.6*  PLT 242 189  --  191   Basic Metabolic Panel:  Recent Labs Lab 08/30/16 1751  09/01/16 0940 09/01/16 0949 09/02/16 1959  NA 133* 134* 136 138  K 3.7 3.3* 3.3* 3.1*  CL 97* 100* 100* 104  CO2 25 21*  --  26  GLUCOSE 239* 257* 262* 194*  BUN <5*  CREATININE 0.91 0.71 0.60* 0.72  CALCIUM 9.6 9.8  --  9.6   GFR: Estimated Creatinine Clearance: 177.2 mL/min (by C-G formula based on SCr of 0.72 mg/dL). Liver Function Tests:  Recent Labs Lab 08/30/16 1751 09/01/16 0940 09/02/16 1959  AST ALT 15* 13* 12*  ALKPHOS 72 67 60  BILITOT 1.3* 1.4* 1.2  PROT 7.6 7.4 7.1  ALBUMIN 4.6 4.7 4.3    Recent Labs Lab 08/30/16 1751 09/01/16 0940 09/02/16 1959  LIPASE <10* <10* <10*   No results for input(s): AMMONIA in the last 168 hours. Coagulation Profile: No results for input(s): INR, PROTIME in the last 168 hours. Cardiac Enzymes: No results for input(s): CKTOTAL, CKMB, CKMBINDEX, TROPONINI in the last 168 hours. BNP (last 3 results) No results for input(s): PROBNP in the last 8760 hours. HbA1C: No results for input(s): HGBA1C in the last 72 hours. CBG:  Recent Labs Lab 08/30/16 1842  GLUCAP 231*   Lipid Profile: No results for input(s): CHOL, HDL, LDLCALC, TRIG, CHOLHDL, LDLDIRECT in the last 72 hours. Thyroid Function Tests: No results for input(s): TSH, T4TOTAL, FREET4, T3FREE, THYROIDAB in the last 72 hours. Anemia Panel: No results for input(s): VITAMINB12, FOLATE, FERRITIN, TIBC, IRON, RETICCTPCT in the last 72 hours. Urine analysis:    Component Value Date/Time   COLORURINE STRAW (A) 09/02/2016 2014   APPEARANCEUR CLEAR 09/02/2016 2014   LABSPEC 1.003 (L) 09/02/2016 2014   PHURINE 7.0 09/02/2016 2014   GLUCOSEU 50 (A) 09/02/2016 2014   HGBUR NEGATIVE 09/02/2016 2014   BILIRUBINUR NEGATIVE 09/02/2016 2014   KETONESUR NEGATIVE 09/02/2016 2014   PROTEINUR NEGATIVE 09/02/2016 2014   NITRITE NEGATIVE 09/02/2016 2014   LEUKOCYTESUR NEGATIVE 09/02/2016 2014    Radiological Exams on Admission: Dg Abd Acute  W/chest  Result Date: 09/03/2016 CLINICAL DATA:  Upper abdominal pain for 4 days. EXAM: DG ABDOMEN ACUTE W/ 1V CHEST COMPARISON:  09/01/2016 FINDINGS: There is no evidence of dilated bowel loops or free intraperitoneal air. No radiopaque calculi or other significant radiographic abnormality is seen. Heart size and mediastinal contours are within normal limits. Both lungs are clear. IMPRESSION: Negative abdominal radiographs.  No acute cardiopulmonary disease. Electronically Signed   By: Ellery Plunk M.D.   On: 09/03/2016 03:48   Dg Abdomen Acute W/chest  Result Date: 09/01/2016 CLINICAL DATA:  Nausea, vomiting for 2 days.  Sharp abdominal pain. EXAM: DG ABDOMEN ACUTE W/ 1V CHEST COMPARISON:  None. FINDINGS: There is no evidence of dilated bowel loops or free intraperitoneal air. Moderate amount of stool throughout the colon. No radiopaque calculi or other  significant radiographic abnormality is seen. Heart size and mediastinal contours are within normal limits. Both lungs are clear. No acute osseous abnormality. IMPRESSION: Negative abdominal radiographs.  No acute cardiopulmonary disease. Electronically Signed   By: Elige Ko   On: 09/01/2016 09:57    EKG: Independently reviewed.  Assessment/Plan Principal Problem:   Diabetic gastroparesis (HCC) Active Problems:   Type 1 diabetes mellitus with diabetic autonomic neuropathy, with long-term current use of insulin (HCC)   Diabetic gastroparesis associated with type 1 diabetes mellitus (HCC)    1. Diabetic gastroparesis - probable 1. Empiric treatment with scheduled reglan 2. Ultimately will need GI follow up and gastric emptying study when not on reglan 3. Avoid narcotics which further delay gastric emptying 4. If pain uncontrolled then patient needs NGT 5. IVF: NS at 125 cc/hr 2. DM1 - 1. When not eating due to vomting, patient has taken levimir once daily instead of twice yesterday 2. Will continue levemir 24 units once daily  (instead of his usual 2x daily) 3. And put him on sensitive scale SSI Q4H  DVT prophylaxis: Lovenox Code Status: Full Family Communication: No family in room Disposition Plan: Home after admit Consults called: None Admission status: Place in obs   GARDNER, Heywood Iles. DO [facility] Pager [phone]  If 7AM-7PM, please contact day team taking care of patient [email or web address] Password [facility]  09/03/2016, 4:33 AM

## 2016-09-03 NOTE — Progress Notes (Signed)
PROGRESS NOTE  Christopher Callahan  QVZ:[id number] DOB: 22-Apr-1991 DOA: 09/03/2016 PCP: Jacquelynn Cree, PA-C   Brief Narrative/Subjective: Christopher Callahan is a 26 y.o. male with a history of HTN, poorly-controlled T1DM and possibly gastroparesis, and cholecystectomy who presented to the ED on 4/3 for the third time in four days for abdominal pain, nausea and vomiting that had been unimproved with phenergan at home and multiple rounds of narcotics, phenergan, and one dose of reglan in ED. Due to inability to take po and recurrent symptoms prompting multiple ED visits, he was brought in for observation and started on scheduled reglan without much improvement.   He reports to me that he has had no relief from reglan and has had increasing frequency of cycles of symptoms for months, usually resolved with IVF and a couple doses of phenergan, but not this time. The only helpful thing is hot showers. He endorses chronic marijuana use, but not in the past 2 - 3 months. He lives in [place], but is staying with his mother locally while recuperating from surgery for left foot abscess requiring surgery.   Assessment & Plan: Principal Problem:   Diabetic gastroparesis (HCC) Active Problems:   Type 1 diabetes mellitus with diabetic autonomic neuropathy, with long-term current use of insulin (HCC)   Diabetic gastroparesis associated with type 1 diabetes mellitus (HCC)  Abdominal pain, nausea and vomiting: Presumed diagnosis is gastroparesis, has been followed at [facility] in the past. Had been taken off reglan after negative gastric emptying study in June 2013 at [facility]. Symptoms not attributable to hyperglycemia, no gap or acidosis. Had normal abd XR. History somewhat suggestive of cyclic vomiting syndrome and/or cannabinoid hyperemesis.  - Continue scheduled reglan - Continue observation, if no improvement, may need further imaging/GI consult - Check UDS. - Discussed with patient the need to avoid narcotics.  Tylenol and toradol ordered. Will add tramadol for severe pain only. - IVF's  T1DM: HbA1c as high as 15% in the past, does not recall his last value. Has been followed by endocrinology in [place].  - Continue levemir once daily (1/2 home dose) - Continue sensitive SSI q4h, at inpatient goal.  Hypertension: Not been taking BP medications due to N/V/abd pain. Also elevated presumably due to pain. - Continue low dose home lisinopril  - PRN Hydralazine ordered.   Hypokalemia: Suspect due to poor po and emesis.  - Replete and recheck.  Objective: Vitals:   09/03/16 0505 09/03/16 0548 09/03/16 0952 09/03/16 1101  BP: (!) 168/76 (!) 163/94 (!) 171/104 (!) 174/102  Pulse: 97 100  100  Resp: 16 16    Temp:  98 F (36.7 C)    TempSrc:  Oral    SpO2: 100% 100%    Weight:      Height:        Intake/Output Summary (Last 24 hours) at 09/03/16 1325 Last data filed at 09/03/16 1128  Gross per 24 hour  Intake          2041.67 ml  Output             1000 ml  Net          1041.67 ml   Filed Weights   09/02/16 1930  Weight: 100.2 kg (221 lb)    Examination: General exam: 26 y.o. male in no distress Respiratory system: Non-labored breathing room air. Clear to auscultation bilaterally.  Cardiovascular system: Regular rate and rhythm. No murmur, rub, or gallop. No JVD, and no pedal edema. Gastrointestinal system:  Abdomen soft, diffusely tender without rebound, non-distended, with normoactive bowel sounds. No organomegaly or masses felt. Central nervous system: Alert and oriented. No focal neurological deficits. Extremities: Warm, no deformities Skin: No rashes, lesions no ulcers Psychiatry: Judgement and insight appear normal. Mood & affect appropriate.   CBC:  Recent Labs Lab 08/30/16 1751 09/01/16 0940 09/01/16 0949 09/02/16 1959  WBC 8.1 5.8  --  5.7  NEUTROABS  --  3.1  --   --   HGB 14.3 13.9 13.3 12.5*  HCT 40.4 38.7* 39.0 35.6*  MCV 74.3* 73.6*  --  74.6*  PLT 242 189  --   191   Basic Metabolic Panel:  Recent Labs Lab 08/30/16 1751 09/01/16 0940 09/01/16 0949 09/02/16 1959  NA 133* 134* 136 138  K 3.7 3.3* 3.3* 3.1*  CL 97* 100* 100* 104  CO2 25 21*  --  26  GLUCOSE 239* 257* 262* 194*  BUN <5*  CREATININE 0.91 0.71 0.60* 0.72  CALCIUM 9.6 9.8  --  9.6   GFR: Estimated Creatinine Clearance: 177.2 mL/min (by C-G formula based on SCr of 0.72 mg/dL). Liver Function Tests:  Recent Labs Lab 08/30/16 1751 09/01/16 0940 09/02/16 1959  AST ALT 15* 13* 12*  ALKPHOS 72 67 60  BILITOT 1.3* 1.4* 1.2  PROT 7.6 7.4 7.1  ALBUMIN 4.6 4.7 4.3    Recent Labs Lab 08/30/16 1751 09/01/16 0940 09/02/16 1959  LIPASE <10* <10* <10*   CBG:  Recent Labs Lab 08/30/16 1842 09/03/16 0941 09/03/16 1158  GLUCAP 231* 190* 171*   Urine analysis:    Component Value Date/Time   COLORURINE STRAW (A) 09/02/2016 2014   APPEARANCEUR CLEAR 09/02/2016 2014   LABSPEC 1.003 (L) 09/02/2016 2014   PHURINE 7.0 09/02/2016 2014   GLUCOSEU 50 (A) 09/02/2016 2014   HGBUR NEGATIVE 09/02/2016 2014   BILIRUBINUR NEGATIVE 09/02/2016 2014   KETONESUR NEGATIVE 09/02/2016 2014   PROTEINUR NEGATIVE 09/02/2016 2014   NITRITE NEGATIVE 09/02/2016 2014   LEUKOCYTESUR NEGATIVE 09/02/2016 2014   Radiology Studies: Dg Abd Acute W/chest  Result Date: 09/03/2016 CLINICAL DATA:  Upper abdominal pain for 4 days. EXAM: DG ABDOMEN ACUTE W/ 1V CHEST COMPARISON:  09/01/2016 FINDINGS: There is no evidence of dilated bowel loops or free intraperitoneal air. No radiopaque calculi or other significant radiographic abnormality is seen. Heart size and mediastinal contours are within normal limits. Both lungs are clear. IMPRESSION: Negative abdominal radiographs.  No acute cardiopulmonary disease. Electronically Signed   By: Ellery Plunk M.D.   On: 09/03/2016 03:48   Hazeline Junker, MD [facility] Pager [phone]  If 7PM-7AM, please contact  night-coverage [email or web address] Password [email or web address] 09/03/2016, 1:25 PM

## 2016-09-03 NOTE — ED Provider Notes (Signed)
[facility]-EMERGENCY DEPT Provider Note   CSN: [id number] Arrival date & time: 09/02/16  1909     History   Chief Complaint Chief Complaint  Patient presents with  . Abdominal Pain  . Emesis    HPI Christopher Callahan is a 26 y.o. male.  HPI Patient presents to the emergency department with continued abdominal discomfort with nausea and vomiting.  The patient states that he was seen yesterday for similar symptoms and did feel some better before discharge.  Patient states that he did not take the medications prescribed yesterdayThe patient denies chest pain, shortness of breath, headache,blurred vision, neck pain, fever, cough, weakness, numbness, dizziness, anorexia, edema,  diarrhea, rash, back pain, dysuria, hematemesis, bloody stool, near syncope, or syncope. Past Medical History:  Diagnosis Date  . Diabetes mellitus without complication (HCC)     There are no active problems to display for this patient.   Past Surgical History:  Procedure Laterality Date  . CHOLECYSTECTOMY    . SKIN GRAFT         Home Medications    Prior to Admission medications   Medication Sig Start Date End Date Taking? Authorizing Provider  amoxicillin-clavulanate (AUGMENTIN) 875-125 MG tablet Take 1 tablet by mouth 2 (two) times daily. 08/26/16   Historical Provider, MD  dicyclomine (BENTYL) 20 MG tablet Take 1 tablet (20 mg total) by mouth 2 (two) times daily as needed for spasms (abdominal pain). 09/01/16   [name], PA-C  ferrous sulfate 325 (65 FE) MG tablet Take 325 mg by mouth daily with breakfast.    Historical Provider, MD  insulin aspart (NOVOLOG) 100 UNIT/ML FlexPen Inject 5 Units into the skin 3 (three) times daily. 07/10/16   Historical Provider, MD  Insulin Detemir (LEVEMIR FLEXTOUCH) 100 UNIT/ML Pen Inject 24 Units into the skin 2 (two) times daily. 07/10/16   Historical Provider, MD  lisinopril (PRINIVIL,ZESTRIL) 5 MG tablet Take 5 mg by mouth daily. 07/10/16   Historical Provider, MD    promethazine (PHENERGAN) 25 MG tablet Take 1 tablet (25 mg total) by mouth every 6 (six) hours as needed for nausea or vomiting. 08/30/16   [name], MD    Family History No family history on file.  Social History Social History  Substance Use Topics  . Smoking status: Current Every Day Smoker    Packs/day: 0.30    Types: Cigarettes  . Smokeless tobacco: Never Used  . Alcohol use No     Allergies   Patient has no known allergies.   Review of Systems Review of Systems All other systems negative except as documented in the HPI. All pertinent positives and negatives as reviewed in the HPI.  Physical Exam Updated Vital Signs BP (!) 169/102 (BP Location: Left Arm)   Pulse 100   Temp 99 F (37.2 C) (Oral)   Resp 18   Ht  (1.854 m)   Wt 100.2 kg   SpO2 100%   BMI 29.16 kg/m   Physical Exam  Constitutional: He is oriented to person, place, and time. He appears well-developed and well-nourished. No distress.  HENT:  Head: Normocephalic and atraumatic.  Mouth/Throat: Oropharynx is clear and moist.  Eyes: Pupils are equal, round, and reactive to light.  Neck: Normal range of motion. Neck supple.  Cardiovascular: Normal rate, regular rhythm and normal heart sounds.  Exam reveals no gallop and no friction rub.   No murmur heard. Pulmonary/Chest: Effort normal and breath sounds normal. No respiratory distress. He has no wheezes.  Abdominal: Soft. Bowel sounds are normal. He exhibits no distension and no mass. There is tenderness. There is no rebound and no guarding.  Neurological: He is alert and oriented to person, place, and time. He exhibits normal muscle tone. Coordination normal.  Skin: Skin is warm and dry. Capillary refill takes less than 2 seconds. No rash noted. No erythema.  Psychiatric: He has a normal mood and affect. His behavior is normal.  Nursing note and vitals reviewed.    ED Treatments / Results  Labs (all labs ordered are listed, but only  abnormal results are displayed) Labs Reviewed  LIPASE, BLOOD - Abnormal; Notable for the following:       Result Value   Lipase <10 (*)    All other components within normal limits  COMPREHENSIVE METABOLIC PANEL - Abnormal; Notable for the following:    Potassium 3.1 (*)    Glucose, Bld 194 (*)    BUN <5 (*)    ALT 12 (*)    All other components within normal limits  CBC - Abnormal; Notable for the following:    Hemoglobin 12.5 (*)    HCT 35.6 (*)    MCV 74.6 (*)    All other components within normal limits  URINALYSIS, ROUTINE W REFLEX MICROSCOPIC - Abnormal; Notable for the following:    Color, Urine STRAW (*)    Specific Gravity, Urine 1.003 (*)    Glucose, UA 50 (*)    All other components within normal limits    EKG  EKG Interpretation None       Radiology Dg Abdomen Acute W/chest  Result Date: 09/01/2016 CLINICAL DATA:  Nausea, vomiting for 2 days.  Sharp abdominal pain. EXAM: DG ABDOMEN ACUTE W/ 1V CHEST COMPARISON:  None. FINDINGS: There is no evidence of dilated bowel loops or free intraperitoneal air. Moderate amount of stool throughout the colon. No radiopaque calculi or other significant radiographic abnormality is seen. Heart size and mediastinal contours are within normal limits. Both lungs are clear. No acute osseous abnormality. IMPRESSION: Negative abdominal radiographs.  No acute cardiopulmonary disease. Electronically Signed   By: Elige Ko   On: 09/01/2016 09:57    Procedures Procedures (including critical care time)  Medications Ordered in ED Medications  promethazine (PHENERGAN) injection 25 mg (not administered)  sodium chloride 0.9 % bolus 1,000 mL (1,000 mLs Intravenous New Bag/Given 09/03/16 0106)  morphine 4 MG/ML injection 4 mg (4 mg Intravenous Given 09/03/16 0109)     Initial Impression / Assessment and Plan / ED Course  I have reviewed the triage vital signs and the nursing notes.  Pertinent labs & imaging results that were available  during my care of the patient were reviewed by me and considered in my medical decision making (see chart for details).   patient be given IV fluids along with pain control and antiemetics.    Final Clinical Impressions(s) / ED Diagnoses   Final diagnoses:  None    New Prescriptions New Prescriptions   No medications on file     Charlestine Night, PA-C 09/03/16 0116    Gilda Crease, MD 09/03/16 [phone]

## 2016-09-03 NOTE — ED Notes (Signed)
Attempted to call report

## 2016-09-03 NOTE — Progress Notes (Addendum)
Paged MD Jarvis Newcomer about patient BP remaining high despite scheduled lisinpril given, did not receive a call back. Patient not symptomatic, will continue to monitor.

## 2016-09-04 DIAGNOSIS — E1043 Type 1 diabetes mellitus with diabetic autonomic (poly)neuropathy: Secondary | ICD-10-CM | POA: Diagnosis not present

## 2016-09-04 DIAGNOSIS — K3184 Gastroparesis: Secondary | ICD-10-CM | POA: Diagnosis not present

## 2016-09-04 DIAGNOSIS — I10 Essential (primary) hypertension: Secondary | ICD-10-CM | POA: Diagnosis present

## 2016-09-04 LAB — BASIC METABOLIC PANEL
Anion gap: 7 (ref 5–15)
BUN: 8 mg/dL (ref 6–20)
CALCIUM: 8.8 mg/dL — AB (ref 8.9–10.3)
CHLORIDE: 108 mmol/L (ref 101–111)
CO2: 26 mmol/L (ref 22–32)
CREATININE: 0.7 mg/dL (ref 0.61–1.24)
GFR calc Af Amer: >60 mL/min (ref >60)
GFR calc non Af Amer: >60 mL/min (ref >60)
Glucose, Bld: 107 mg/dL — ABNORMAL HIGH (ref 65–99)
Potassium: 3.6 mmol/L (ref 3.5–5.1)
SODIUM: 141 mmol/L (ref 135–145)

## 2016-09-04 LAB — GLUCOSE, CAPILLARY
GLUCOSE-CAPILLARY: 117 mg/dL — AB (ref 65–99)
Glucose-Capillary: 98 mg/dL (ref 65–99)
Glucose-Capillary: 84 mg/dL (ref 65–99)
Glucose-Capillary: 98 mg/dL (ref 65–99)

## 2016-09-04 MED ORDER — METOCLOPRAMIDE HCL 5 MG PO TABS: 5.0000 mg | ORAL_TABLET | Freq: Three times a day (TID) | ORAL | 0 refills | Status: DC | PRN

## 2016-09-04 MED ORDER — WHITE PETROLATUM GEL
Status: AC
  Filled 2016-09-04: qty 1

## 2016-09-04 NOTE — Discharge Summary (Addendum)
Physician Discharge Summary  Rouse NWG:[id number] DOB: [birth year] DOA: 09/03/2016  PCP: Jacquelynn Cree, PA-C  Admit date: 09/03/2016 Discharge date: 09/04/2016  Admitted From: Home Disposition:  Home  Recommendations for Outpatient Follow-up:  1. Follow up with PCP in 1-2 weeks  Home Health: None Equipment/Devices: None  Discharge Condition: Stable CODE STATUS: Full code Diet recommendation:carb modified     Discharge Diagnoses:   Principle problem   Diabetic gastroparesis associated with type 1 diabetes mellitus (HCC)   Active Problems:   Type 1 diabetes mellitus with diabetic autonomic neuropathy, with long-term current use of insulin (HCC)   Essential hypertension  Brief narrative/history of present illness 25 y.o.male with a history of HTN, poorly-controlled T1DM and possibly gastroparesis, who presented to the ED on 4/3 for the third time in four days for abdominal pain, nausea and vomiting that was not improving with phenergan at home and multiple rounds of narcotics, phenergan, and one dose of reglan in ED. Due to inability to take po and recurrent symptoms prompting multiple ED visits, he was brought in for observation and started on scheduled reglan without much improvement.   He reported no relief from reglan and has had increasing frequency of cycles of symptoms for months, usually resolved with IVF and a couple doses of phenergan, but not this time. The only helpful thing is hot showers. He endorsed chronic marijuana use, but not in the past 2 - 3 months. He lives in [place], but is staying with his mother locally while recuperating from surgery for left foot abscess requiring surgery.   Patient placed on observation for possible acute diabetic gastroparesis.  Hospital course Diabetic gastroparesis (HCC) His symptoms of abdominal pain with recurrent nausea and vomiting in the setting of uncontrolled diabetes mellitus fevers Diabetic Gastroparesis. Patient kept  Nothing by Mouth and Received IV Hydration along with Antiemetics (IV Reglan). Symptoms resolved this morning and patient started on diet which he tolerated well. Patient instructed to avoid narcotics and also ovoid marijuana which may be aggravating his symptoms. Patient will be discharged on oral Reglan as needed for his symptoms. He has prescriptions for Phenergan as outpatient. Does not need any pain medications.  Uncontrolled type 1 diabetes mellitus with hyperglycemia Reports his CBG to be better controlled after his foot surgery recently. Usually ranges from 150-250s. Instructed on strict diet adherence and compliance with his insulin regimen. He reports seeing endocrinologist Dr. Timothy Lasso in [place]. Instructed to follow-up as outpatient. Resume home insulin regimen.  Essential hypertension Blood pressure elevated due to acute symptoms and patient unable to take home blood pressure medications. Lisinopril resumed.  Hypokalemia Due to GI loss. Repleted  Left foot ulceration status post skin graft 1 month back  not on abx anymore      Family communication: None at bedside Disposition: Home    Discharge Instructions   Allergies as of 09/04/2016   No Known Allergies     Medication List    STOP taking these medications   amoxicillin-clavulanate 875-125 MG tablet Commonly known as:  AUGMENTIN     TAKE these medications   dicyclomine 20 MG tablet Commonly known as:  BENTYL Take 1 tablet (20 mg total) by mouth 2 (two) times daily as needed for spasms (abdominal pain).   ferrous sulfate 325 (65 FE) MG tablet Take 325 mg by mouth daily with breakfast.   insulin aspart 100 UNIT/ML FlexPen Commonly known as:  NOVOLOG Inject 5 Units into the skin 3 (three) times daily.   LEVEMIR FLEXTOUCH  100 UNIT/ML Pen Generic drug:  Insulin Detemir Inject 24 Units into the skin 2 (two) times daily.   lisinopril 5 MG tablet Commonly known as:  PRINIVIL,ZESTRIL Take 5 mg by mouth  daily.   metoCLOPramide 5 MG tablet Commonly known as:  REGLAN Take 1 tablet (5 mg total) by mouth every 8 (eight) hours as needed for nausea.   promethazine 25 MG tablet Commonly known as:  PHENERGAN Take 1 tablet (25 mg total) by mouth every 6 (six) hours as needed for nausea or vomiting.      Follow-up Information    COLEMAN, ANGELA, PA-C Follow up in 1 week(s).   Specialty:  Internal Medicine Contact information: [address] [place] [address] [phone]          No Known Allergies      Procedures/Studies: Dg Abd Acute W/chest  Result Date: 09/03/2016 CLINICAL DATA:  Upper abdominal pain for 4 days. EXAM: DG ABDOMEN ACUTE W/ 1V CHEST COMPARISON:  09/01/2016 FINDINGS: There is no evidence of dilated bowel loops or free intraperitoneal air. No radiopaque calculi or other significant radiographic abnormality is seen. Heart size and mediastinal contours are within normal limits. Both lungs are clear. IMPRESSION: Negative abdominal radiographs.  No acute cardiopulmonary disease. Electronically Signed   By: Ellery Plunk M.D.   On: 09/03/2016 03:48   Dg Abdomen Acute W/chest  Result Date: 09/01/2016 CLINICAL DATA:  Nausea, vomiting for 2 days.  Sharp abdominal pain. EXAM: DG ABDOMEN ACUTE W/ 1V CHEST COMPARISON:  None. FINDINGS: There is no evidence of dilated bowel loops or free intraperitoneal air. Moderate amount of stool throughout the colon. No radiopaque calculi or other significant radiographic abnormality is seen. Heart size and mediastinal contours are within normal limits. Both lungs are clear. No acute osseous abnormality. IMPRESSION: Negative abdominal radiographs.  No acute cardiopulmonary disease. Electronically Signed   By: Elige Ko   On: 09/01/2016 09:57       Subjective: No further nausea or vomiting since admission. No further abdominal pain tolerating diet.  Discharge Exam: Vitals:   09/04/16 0445 09/04/16 0929  BP: (!) 135/100 (!)  155/97  Pulse: 88 97  Resp: 15   Temp: 97.9 F (36.6 C)    Vitals:   09/03/16 1600 09/03/16 2058 09/04/16 0445 09/04/16 0929  BP: 138/84 129/77 (!) 135/100 (!) 155/97  Pulse: 98 93 88 97  Resp:  16 15   Temp:  98.2 F (36.8 C) 97.9 F (36.6 C)   TempSrc:  Oral Oral   SpO2: 100% 100% 99%   Weight:      Height:        General:  not in acute distress HEENT: Moist mucosa, supple neck Chest: Clear to auscultation bilaterally CVS: Normal S1 and S2, no murmurs GI: Soft, nondistended, bowel sounds present, nontender Musculoskeletal: Warm, no edema, left foot graft site appears clean   The results of significant diagnostics from this hospitalization (including imaging, microbiology, ancillary and laboratory) are listed below for reference.     Microbiology: No results found for this or any previous visit (from the past 240 hour(s)).   Labs: BNP (last 3 results) No results for input(s): BNP in the last 8760 hours. Basic Metabolic Panel:  Recent Labs Lab 08/30/16 1751 09/01/16 0940 09/01/16 0949 09/02/16 1959 09/04/16 0530  NA 133* 134* 136 138 141  K 3.7 3.3* 3.3* 3.1* 3.6  CL 97* 100* 100* 104 108  CO2 25 21*  --  26 26  GLUCOSE 239* 257* 262* 194* 107*  BUN <5* 8  CREATININE 0.91 0.71 0.60* 0.72 0.70  CALCIUM 9.6 9.8  --  9.6 8.8*   Liver Function Tests:  Recent Labs Lab 08/30/16 1751 09/01/16 0940 09/02/16 1959  AST ALT 15* 13* 12*  ALKPHOS 72 67 60  BILITOT 1.3* 1.4* 1.2  PROT 7.6 7.4 7.1  ALBUMIN 4.6 4.7 4.3    Recent Labs Lab 08/30/16 1751 09/01/16 0940 09/02/16 1959  LIPASE <10* <10* <10*   No results for input(s): AMMONIA in the last 168 hours. CBC:  Recent Labs Lab 08/30/16 1751 09/01/16 0940 09/01/16 0949 09/02/16 1959  WBC 8.1 5.8  --  5.7  NEUTROABS  --  3.1  --   --   HGB 14.3 13.9 13.3 12.5*  HCT 40.4 38.7* 39.0 35.6*  MCV 74.3* 73.6*  --  74.6*  PLT 242 189  --  191   Cardiac Enzymes: No results for  input(s): CKTOTAL, CKMB, CKMBINDEX, TROPONINI in the last 168 hours. BNP: Invalid input(s): POCBNP CBG:  Recent Labs Lab 09/03/16 1552 09/03/16 1944 09/04/16 0018 09/04/16 0446 09/04/16 0719  GLUCAP 118* 98 117* 84 98   D-Dimer No results for input(s): DDIMER in the last 72 hours. Hgb A1c No results for input(s): HGBA1C in the last 72 hours. Lipid Profile No results for input(s): CHOL, HDL, LDLCALC, TRIG, CHOLHDL, LDLDIRECT in the last 72 hours. Thyroid function studies No results for input(s): TSH, T4TOTAL, T3FREE, THYROIDAB in the last 72 hours.  Invalid input(s): FREET3 Anemia work up No results for input(s): VITAMINB12, FOLATE, FERRITIN, TIBC, IRON, RETICCTPCT in the last 72 hours. Urinalysis    Component Value Date/Time   COLORURINE STRAW (A) 09/02/2016 2014   APPEARANCEUR CLEAR 09/02/2016 2014   LABSPEC 1.003 (L) 09/02/2016 2014   PHURINE 7.0 09/02/2016 2014   GLUCOSEU 50 (A) 09/02/2016 2014   HGBUR NEGATIVE 09/02/2016 2014   BILIRUBINUR NEGATIVE 09/02/2016 2014   KETONESUR NEGATIVE 09/02/2016 2014   PROTEINUR NEGATIVE 09/02/2016 2014   NITRITE NEGATIVE 09/02/2016 2014   LEUKOCYTESUR NEGATIVE 09/02/2016 2014   Sepsis Labs Invalid input(s): PROCALCITONIN,  WBC,  LACTICIDVEN Microbiology No results found for this or any previous visit (from the past 240 hour(s)).   Time coordinating discharge: <30 minutes  SIGNED:   Eddie North, MD  [facility] 09/04/2016, 9:52 AM Pager   If 7PM-7AM, please contact night-coverage [email or web address] Password [email or web address]

## 2016-09-04 NOTE — Progress Notes (Signed)
Discharge instructions reviewed with patient and patient's mother. Questions answered re: new meds and next dose due. Patient able to answer teach back questions. Printed AVS and new prescription given to patient. Patient discharged. Request to walk out. Accompanied by mom.

## 2016-09-04 NOTE — Discharge Instructions (Signed)
Blood Glucose Monitoring, Adult Monitoring your blood sugar (glucose) helps you manage your diabetes. It also helps you and your health care provider determine how well your diabetes management plan is working. Blood glucose monitoring involves checking your blood glucose as often as directed, and keeping a record (log) of your results over time. Why should I monitor my blood glucose? Checking your blood glucose regularly can:  Help you understand how food, exercise, illnesses, and medicines affect your blood glucose.  Let you know what your blood glucose is at any time. You can quickly tell if you are having low blood glucose (hypoglycemia) or high blood glucose (hyperglycemia).  Help you and your health care provider adjust your medicines as needed. When should I check my blood glucose? Follow instructions from your health care provider about how often to check your blood glucose. This may depend on:  The type of diabetes you have.  How well-controlled your diabetes is.  Medicines you are taking. If you have type 1 diabetes:   Check your blood glucose at least 2 times a day.  Also check your blood glucose:  Before every insulin injection.  Before and after exercise.  Between meals.  2 hours after a meal.  Occasionally between 2:00 a.m. and 3:00 a.m., as directed.  Before potentially dangerous tasks, like driving or using heavy machinery.  At bedtime.  You may need to check your blood glucose more often, up to 6-10 times a day:  If you use an insulin pump.  If you need multiple daily injections (MDI).  If your diabetes is not well-controlled.  If you are ill.  If you have a history of severe hypoglycemia.  If you have a history of not knowing when your blood glucose is getting low (hypoglycemia unawareness). If you have type 2 diabetes:   If you take insulin or other diabetes medicines, check your blood glucose at least 2 times a day.  If you are on intensive  insulin therapy, check your blood glucose at least 4 times a day. Occasionally, you may also need to check between 2:00 a.m. and 3:00 a.m., as directed.  Also check your blood glucose:  Before and after exercise.  Before potentially dangerous tasks, like driving or using heavy machinery.  You may need to check your blood glucose more often if:  Your medicine is being adjusted.  Your diabetes is not well-controlled.  You are ill. What is a blood glucose log?  A blood glucose log is a record of your blood glucose readings. It helps you and your health care provider:  Look for patterns in your blood glucose over time.  Adjust your diabetes management plan as needed.  Every time you check your blood glucose, write down your result and notes about things that may be affecting your blood glucose, such as your diet and exercise for the day.  Most glucose meters store a record of glucose readings in the meter. Some meters allow you to download your records to a computer. How do I check my blood glucose? Follow these steps to get accurate readings of your blood glucose: Supplies needed    Blood glucose meter.  Test strips for your meter. Each meter has its own strips. You must use the strips that come with your meter.  A needle to prick your finger (lancet). Do not use lancets more than once.  A device that holds the lancet (lancing device).  A journal or log book to write down your results. Procedure  Wash your hands with soap and water.  Prick the side of your finger (not the tip) with the lancet. Use a different finger each time.  Gently rub the finger until a small drop of blood appears.  Follow instructions that come with your meter for inserting the test strip, applying blood to the strip, and using your blood glucose meter.  Write down your result and any notes. Alternative testing sites   Some meters allow you to use areas of your body other than your finger  (alternative sites) to test your blood.  If you think you may have hypoglycemia, or if you have hypoglycemia unawareness, do not use alternative sites. Use your finger instead.  Alternative sites may not be as accurate as the fingers, because blood flow is slower in these areas. This means that the result you get may be delayed, and it may be different from the result that you would get from your finger.  The most common alternative sites are:  Forearm.  Thigh.  Palm of the hand. Additional tips   Always keep your supplies with you.  If you have questions or need help, all blood glucose meters have a 24-hour hotline number that you can call. You may also contact your health care provider.  After you use a few boxes of test strips, adjust (calibrate) your blood glucose meter by following instructions that came with your meter. This information is not intended to replace advice given to you by your health care provider. Make sure you discuss any questions you have with your health care provider. Document Released: 05/23/2003 Document Revised: 12/08/2015 Document Reviewed: 10/30/2015 Elsevier Interactive Patient Education  2017 [employer]    Gastroparesis Gastroparesis, also called delayed gastric emptying, is a condition in which food takes longer than normal to empty from the stomach. The condition is usually long-lasting (chronic). What are the causes? This condition may be caused by:  An endocrine disorder, such as hypothyroidism or diabetes. Diabetes is the most common cause of this condition.  A nervous system disease, such as Parkinson disease or multiple sclerosis.  Cancer, infection, or surgery of the stomach or vagus nerve.  A connective tissue disorder, such as scleroderma.  Certain medicines. In most cases, the cause is not known. What increases the risk? This condition is more likely to develop in:  People with certain disorders, including endocrine  disorders, eating disorders, amyloidosis, and scleroderma.  People with certain diseases, including Parkinson disease or multiple sclerosis.  People with cancer or infection of the stomach or vagus nerve.  People who have had surgery on the stomach or vagus nerve.  People who take certain medicines.  Women. What are the signs or symptoms? Symptoms of this condition include:  An early feeling of fullness when eating.  Nausea.  Weight loss.  Vomiting.  Heartburn.  Abdominal bloating.  Inconsistent blood glucose levels.  Lack of appetite.  Acid from the stomach coming up into the esophagus (gastroesophageal reflux).  Spasms of the stomach. Symptoms may come and go. How is this diagnosed? This condition is diagnosed with tests, such as:  Tests that check how long it takes food to move through the stomach and intestines. These tests include:  Upper gastrointestinal (GI) series. In this test, X-rays of the intestines are taken after you drink a liquid. The liquid makes the intestines show up better on the X-rays.  Gastric emptying scintigraphy. In this test, scans are taken after you eat food that contains a small amount  of radioactive material.  Wireless capsule GI monitoring system. This test involves swallowing a capsule that records information about movement through the stomach.  Gastric manometry. This test measures electrical and muscular activity in the stomach. It is done with a thin tube that is passed down the throat and into the stomach.  Endoscopy. This test checks for abnormalities in the lining of the stomach. It is done with a long, thin tube that is passed down the throat and into the stomach.  An ultrasound. This test can help rule out gallbladder disease or pancreatitis as a cause of your symptoms. It uses sound waves to take pictures of the inside of your body. How is this treated? There is no cure for gastroparesis. This condition may be managed  with:  Treatment of the underlying condition causing the gastroparesis.  Lifestyle changes, including exercise and dietary changes. Dietary changes can include:  Changes in what and when you eat.  Eating smaller meals more often.  Eating low-fat foods.  Eating low-fiber forms of high-fiber foods, such as cooked vegetables instead of raw vegetables.  Having liquid foods in place of solid foods. Liquid foods are easier to digest.  Medicines. These may be given to control nausea and vomiting and to stimulate stomach muscles.  Getting food through a feeding tube. This may be done in severe cases.  A gastric neurostimulator. This is a device that is inserted into the body with surgery. It helps improve stomach emptying and control nausea and vomiting. Follow these instructions at home:  Follow your health care provider's instructions about exercise and diet.  Take medicines only as directed by your health care provider. Contact a health care provider if:  Your symptoms do not improve with treatment.  You have new symptoms. Get help right away if:  You have severe abdominal pain that does not improve with treatment.  You have nausea that does not go away.  You cannot keep fluids down. This information is not intended to replace advice given to you by your health care provider. Make sure you discuss any questions you have with your health care provider. Document Released: 05/20/2005 Document Revised: 10/26/2015 Document Reviewed: 05/16/2014 Elsevier Interactive Patient Education  2017 [employer].

## 2017-08-24 ENCOUNTER — Other Ambulatory Visit: Payer: Self-pay

## 2017-08-24 ENCOUNTER — Inpatient Hospital Stay (HOSPITAL_COMMUNITY)
Admission: EM | Admit: 2017-08-24 | Discharge: 2017-09-03 | DRG: 854 | Disposition: A | Discharge status: Rehabilitation Facility | Payer: Federal, State, Local not specified - PPO | Attending: Internal Medicine | Admitting: Internal Medicine

## 2017-08-24 ENCOUNTER — Emergency Department (HOSPITAL_COMMUNITY): Payer: Federal, State, Local not specified - PPO

## 2017-08-24 ENCOUNTER — Encounter (HOSPITAL_COMMUNITY): Payer: Self-pay | Admitting: Emergency Medicine

## 2017-08-24 DIAGNOSIS — E871 Hypo-osmolality and hyponatremia: Secondary | ICD-10-CM | POA: Diagnosis present

## 2017-08-24 DIAGNOSIS — E1065 Type 1 diabetes mellitus with hyperglycemia: Secondary | ICD-10-CM | POA: Diagnosis present

## 2017-08-24 DIAGNOSIS — E1043 Type 1 diabetes mellitus with diabetic autonomic (poly)neuropathy: Secondary | ICD-10-CM | POA: Diagnosis present

## 2017-08-24 DIAGNOSIS — E860 Dehydration: Secondary | ICD-10-CM | POA: Diagnosis present

## 2017-08-24 DIAGNOSIS — F17210 Nicotine dependence, cigarettes, uncomplicated: Secondary | ICD-10-CM | POA: Diagnosis present

## 2017-08-24 DIAGNOSIS — M86171 Other acute osteomyelitis, right ankle and foot: Secondary | ICD-10-CM | POA: Diagnosis present

## 2017-08-24 DIAGNOSIS — Z720 Tobacco use: Secondary | ICD-10-CM

## 2017-08-24 DIAGNOSIS — E876 Hypokalemia: Secondary | ICD-10-CM | POA: Diagnosis present

## 2017-08-24 DIAGNOSIS — L03115 Cellulitis of right lower limb: Secondary | ICD-10-CM | POA: Diagnosis present

## 2017-08-24 DIAGNOSIS — D72829 Elevated white blood cell count, unspecified: Secondary | ICD-10-CM | POA: Diagnosis not present

## 2017-08-24 DIAGNOSIS — Z79899 Other long term (current) drug therapy: Secondary | ICD-10-CM

## 2017-08-24 DIAGNOSIS — E669 Obesity, unspecified: Secondary | ICD-10-CM | POA: Diagnosis present

## 2017-08-24 DIAGNOSIS — Z6831 Body mass index (BMI) 31.0-31.9, adult: Secondary | ICD-10-CM

## 2017-08-24 DIAGNOSIS — E1040 Type 1 diabetes mellitus with diabetic neuropathy, unspecified: Secondary | ICD-10-CM | POA: Diagnosis present

## 2017-08-24 DIAGNOSIS — L97519 Non-pressure chronic ulcer of other part of right foot with unspecified severity: Secondary | ICD-10-CM | POA: Diagnosis present

## 2017-08-24 DIAGNOSIS — K3184 Gastroparesis: Secondary | ICD-10-CM | POA: Diagnosis present

## 2017-08-24 DIAGNOSIS — E1042 Type 1 diabetes mellitus with diabetic polyneuropathy: Secondary | ICD-10-CM | POA: Diagnosis present

## 2017-08-24 DIAGNOSIS — A419 Sepsis, unspecified organism: Principal | ICD-10-CM

## 2017-08-24 DIAGNOSIS — L089 Local infection of the skin and subcutaneous tissue, unspecified: Secondary | ICD-10-CM | POA: Diagnosis not present

## 2017-08-24 DIAGNOSIS — E1165 Type 2 diabetes mellitus with hyperglycemia: Secondary | ICD-10-CM | POA: Diagnosis not present

## 2017-08-24 DIAGNOSIS — M869 Osteomyelitis, unspecified: Secondary | ICD-10-CM | POA: Diagnosis not present

## 2017-08-24 DIAGNOSIS — K5903 Drug induced constipation: Secondary | ICD-10-CM | POA: Diagnosis not present

## 2017-08-24 DIAGNOSIS — R6520 Severe sepsis without septic shock: Secondary | ICD-10-CM | POA: Diagnosis present

## 2017-08-24 DIAGNOSIS — D62 Acute posthemorrhagic anemia: Secondary | ICD-10-CM

## 2017-08-24 DIAGNOSIS — E1142 Type 2 diabetes mellitus with diabetic polyneuropathy: Secondary | ICD-10-CM

## 2017-08-24 DIAGNOSIS — E108 Type 1 diabetes mellitus with unspecified complications: Secondary | ICD-10-CM | POA: Diagnosis not present

## 2017-08-24 DIAGNOSIS — K5900 Constipation, unspecified: Secondary | ICD-10-CM | POA: Diagnosis present

## 2017-08-24 DIAGNOSIS — G8918 Other acute postprocedural pain: Secondary | ICD-10-CM | POA: Diagnosis not present

## 2017-08-24 DIAGNOSIS — I10 Essential (primary) hypertension: Secondary | ICD-10-CM | POA: Diagnosis present

## 2017-08-24 DIAGNOSIS — E875 Hyperkalemia: Secondary | ICD-10-CM | POA: Diagnosis not present

## 2017-08-24 DIAGNOSIS — Z825 Family history of asthma and other chronic lower respiratory diseases: Secondary | ICD-10-CM | POA: Diagnosis not present

## 2017-08-24 DIAGNOSIS — F329 Major depressive disorder, single episode, unspecified: Secondary | ICD-10-CM | POA: Diagnosis present

## 2017-08-24 DIAGNOSIS — M8610 Other acute osteomyelitis, unspecified site: Secondary | ICD-10-CM | POA: Diagnosis not present

## 2017-08-24 DIAGNOSIS — E10621 Type 1 diabetes mellitus with foot ulcer: Secondary | ICD-10-CM | POA: Diagnosis present

## 2017-08-24 DIAGNOSIS — E11628 Type 2 diabetes mellitus with other skin complications: Secondary | ICD-10-CM

## 2017-08-24 DIAGNOSIS — Z9049 Acquired absence of other specified parts of digestive tract: Secondary | ICD-10-CM

## 2017-08-24 DIAGNOSIS — E1069 Type 1 diabetes mellitus with other specified complication: Secondary | ICD-10-CM | POA: Diagnosis present

## 2017-08-24 DIAGNOSIS — Z89511 Acquired absence of right leg below knee: Secondary | ICD-10-CM | POA: Diagnosis not present

## 2017-08-24 DIAGNOSIS — E10649 Type 1 diabetes mellitus with hypoglycemia without coma: Secondary | ICD-10-CM | POA: Diagnosis present

## 2017-08-24 DIAGNOSIS — Z794 Long term (current) use of insulin: Secondary | ICD-10-CM | POA: Diagnosis not present

## 2017-08-24 DIAGNOSIS — E872 Acidosis: Secondary | ICD-10-CM | POA: Diagnosis not present

## 2017-08-24 DIAGNOSIS — Z22322 Carrier or suspected carrier of Methicillin resistant Staphylococcus aureus: Secondary | ICD-10-CM

## 2017-08-24 DIAGNOSIS — N179 Acute kidney failure, unspecified: Secondary | ICD-10-CM | POA: Diagnosis not present

## 2017-08-24 DIAGNOSIS — E10628 Type 1 diabetes mellitus with other skin complications: Secondary | ICD-10-CM | POA: Diagnosis present

## 2017-08-24 DIAGNOSIS — M86271 Subacute osteomyelitis, right ankle and foot: Secondary | ICD-10-CM

## 2017-08-24 DIAGNOSIS — L97509 Non-pressure chronic ulcer of other part of unspecified foot with unspecified severity: Secondary | ICD-10-CM | POA: Diagnosis not present

## 2017-08-24 DIAGNOSIS — Z4781 Encounter for orthopedic aftercare following surgical amputation: Secondary | ICD-10-CM | POA: Diagnosis present

## 2017-08-24 DIAGNOSIS — S88111S Complete traumatic amputation at level between knee and ankle, right lower leg, sequela: Secondary | ICD-10-CM | POA: Diagnosis not present

## 2017-08-24 LAB — BASIC METABOLIC PANEL
ANION GAP: 12 (ref 5–15)
BUN: 13 mg/dL (ref 6–20)
CHLORIDE: 95 mmol/L — AB (ref 101–111)
CO2: 20 mmol/L — ABNORMAL LOW (ref 22–32)
Calcium: 7.3 mg/dL — ABNORMAL LOW (ref 8.9–10.3)
Creatinine, Ser: 0.86 mg/dL (ref 0.61–1.24)
GFR calc non Af Amer: >60 mL/min (ref >60)
Glucose, Bld: 477 mg/dL — ABNORMAL HIGH (ref 65–99)
Potassium: 3.6 mmol/L (ref 3.5–5.1)
Sodium: 127 mmol/L — ABNORMAL LOW (ref 135–145)

## 2017-08-24 LAB — I-STAT CG4 LACTIC ACID, ED
Lactic Acid, Venous: 3.5 mmol/L (ref 0.5–1.9)
Lactic Acid, Venous: 1.95 mmol/L — ABNORMAL HIGH (ref 0.5–1.9)

## 2017-08-24 LAB — URINALYSIS, ROUTINE W REFLEX MICROSCOPIC
Bacteria, UA: NONE SEEN
Bilirubin Urine: NEGATIVE
Ketones, ur: NEGATIVE mg/dL
LEUKOCYTES UA: NEGATIVE
Nitrite: NEGATIVE
PH: 6 (ref 5.0–8.0)
Protein, ur: NEGATIVE mg/dL
RBC / HPF: NONE SEEN RBC/hpf (ref 0–5)
SQUAMOUS EPITHELIAL / LPF: NONE SEEN
Specific Gravity, Urine: 1.023 (ref 1.005–1.030)

## 2017-08-24 LAB — CBC WITH DIFFERENTIAL/PLATELET
BASOS ABS: 0 10*3/uL (ref 0.0–0.1)
Basophils Relative: 0 %
EOS ABS: 0 10*3/uL (ref 0.0–0.7)
EOS PCT: 0 %
HCT: 38.7 % — ABNORMAL LOW (ref 39.0–52.0)
Hemoglobin: 13.1 g/dL (ref 13.0–17.0)
LYMPHS PCT: 4 %
Lymphs Abs: 1.2 10*3/uL (ref 0.7–4.0)
MCH: 28.4 pg (ref 26.0–34.0)
MCHC: 33.9 g/dL (ref 30.0–36.0)
MCV: 83.9 fL (ref 78.0–100.0)
MONO ABS: 1.4 10*3/uL — AB (ref 0.1–1.0)
Monocytes Relative: 5 %
Neutro Abs: 23.3 10*3/uL — ABNORMAL HIGH (ref 1.7–7.7)
Neutrophils Relative %: 91 %
PLATELETS: 322 10*3/uL (ref 150–400)
RBC: 4.61 MIL/uL (ref 4.22–5.81)
RDW: 13.2 % (ref 11.5–15.5)
WBC: 25.9 10*3/uL — AB (ref 4.0–10.5)

## 2017-08-24 LAB — COMPREHENSIVE METABOLIC PANEL
ALBUMIN: 2.3 g/dL — AB (ref 3.5–5.0)
ALK PHOS: 198 U/L — AB (ref 38–126)
ALT: 16 U/L — AB (ref 17–63)
AST: 19 U/L (ref 15–41)
Anion gap: 13 (ref 5–15)
BILIRUBIN TOTAL: 0.8 mg/dL (ref 0.3–1.2)
BUN: 16 mg/dL (ref 6–20)
CALCIUM: 7.9 mg/dL — AB (ref 8.9–10.3)
CO2: 21 mmol/L — ABNORMAL LOW (ref 22–32)
CREATININE: 1.18 mg/dL (ref 0.61–1.24)
Chloride: 87 mmol/L — ABNORMAL LOW (ref 101–111)
GFR calc Af Amer: >60 mL/min (ref >60)
GLUCOSE: 856 mg/dL — AB (ref 65–99)
POTASSIUM: 4.2 mmol/L (ref 3.5–5.1)
Sodium: 121 mmol/L — ABNORMAL LOW (ref 135–145)
TOTAL PROTEIN: 7.1 g/dL (ref 6.5–8.1)

## 2017-08-24 LAB — LACTIC ACID, PLASMA: Lactic Acid, Venous: 1.9 mmol/L (ref 0.5–1.9)

## 2017-08-24 LAB — GLUCOSE, CAPILLARY: GLUCOSE-CAPILLARY: 356 mg/dL — AB (ref 65–99)

## 2017-08-24 LAB — SEDIMENTATION RATE: SED RATE: 107 mm/h — AB (ref 0–16)

## 2017-08-24 LAB — CBG MONITORING, ED: GLUCOSE-CAPILLARY: 456 mg/dL — AB (ref 65–99)

## 2017-08-24 LAB — C-REACTIVE PROTEIN: CRP: 29.1 mg/dL — AB (ref <1.0)

## 2017-08-24 MED ORDER — INSULIN ASPART 100 UNIT/ML ~~LOC~~ SOLN
12.0000 [IU] | Freq: Once | SUBCUTANEOUS | Status: AC
  Administered 2017-08-24: 12 [IU] via INTRAVENOUS
  Filled 2017-08-24: qty 1

## 2017-08-24 MED ORDER — MORPHINE SULFATE (PF) 4 MG/ML IV SOLN
4.0000 mg | Freq: Once | INTRAVENOUS | Status: AC
  Administered 2017-08-24: 4 mg via INTRAVENOUS
  Filled 2017-08-24: qty 1

## 2017-08-24 MED ORDER — INSULIN GLARGINE 100 UNIT/ML ~~LOC~~ SOLN
10.0000 [IU] | Freq: Every day | SUBCUTANEOUS | Status: DC
  Administered 2017-08-24: 10 [IU] via SUBCUTANEOUS
  Filled 2017-08-24: qty 0.1

## 2017-08-24 MED ORDER — LACTATED RINGERS IV SOLN
INTRAVENOUS | Status: AC
  Administered 2017-08-24 – 2017-08-25 (×2): via INTRAVENOUS

## 2017-08-24 MED ORDER — SODIUM CHLORIDE 0.9% FLUSH
3.0000 mL | Freq: Two times a day (BID) | INTRAVENOUS | Status: DC
  Administered 2017-08-25 – 2017-08-31 (×10): 3 mL via INTRAVENOUS

## 2017-08-24 MED ORDER — OXYCODONE HCL 5 MG PO TABS
5.0000 mg | ORAL_TABLET | ORAL | Status: DC | PRN
  Administered 2017-08-24 – 2017-08-25 (×3): 5 mg via ORAL
  Filled 2017-08-24 (×3): qty 1

## 2017-08-24 MED ORDER — SODIUM CHLORIDE 0.9 % IV BOLUS (SEPSIS)
1000.0000 mL | Freq: Once | INTRAVENOUS | Status: AC
Start: 2017-08-24 — End: 2017-08-24
  Administered 2017-08-24: 1000 mL via INTRAVENOUS

## 2017-08-24 MED ORDER — VANCOMYCIN HCL IN DEXTROSE 1-5 GM/200ML-% IV SOLN
1000.0000 mg | Freq: Three times a day (TID) | INTRAVENOUS | Status: AC
  Administered 2017-08-25 – 2017-08-28 (×12): 1000 mg via INTRAVENOUS
  Filled 2017-08-24 (×12): qty 200

## 2017-08-24 MED ORDER — VANCOMYCIN HCL 10 G IV SOLR
2500.0000 mg | Freq: Once | INTRAVENOUS | Status: AC
  Administered 2017-08-24: 2500 mg via INTRAVENOUS
  Filled 2017-08-24: qty 2500

## 2017-08-24 MED ORDER — PIPERACILLIN-TAZOBACTAM 3.375 G IVPB 30 MIN
3.3750 g | Freq: Once | INTRAVENOUS | Status: AC
  Administered 2017-08-24: 3.375 g via INTRAVENOUS
  Filled 2017-08-24: qty 50

## 2017-08-24 MED ORDER — INSULIN ASPART 100 UNIT/ML ~~LOC~~ SOLN
0.0000 [IU] | SUBCUTANEOUS | Status: DC
  Administered 2017-08-24: 15 [IU] via SUBCUTANEOUS
  Administered 2017-08-25: 5 [IU] via SUBCUTANEOUS
  Administered 2017-08-25: 8 [IU] via SUBCUTANEOUS
  Administered 2017-08-25: 11 [IU] via SUBCUTANEOUS
  Administered 2017-08-25: 5 [IU] via SUBCUTANEOUS
  Administered 2017-08-25: 11 [IU] via SUBCUTANEOUS
  Administered 2017-08-25 – 2017-08-26 (×2): 5 [IU] via SUBCUTANEOUS
  Administered 2017-08-26 (×2): 3 [IU] via SUBCUTANEOUS
  Administered 2017-08-26: 5 [IU] via SUBCUTANEOUS
  Administered 2017-08-26: 3 [IU] via SUBCUTANEOUS
  Administered 2017-08-26: 11 [IU] via SUBCUTANEOUS
  Administered 2017-08-27 (×2): 3 [IU] via SUBCUTANEOUS
  Administered 2017-08-27: 5 [IU] via SUBCUTANEOUS
  Filled 2017-08-24: qty 1

## 2017-08-24 MED ORDER — SODIUM CHLORIDE 0.9 % IV BOLUS (SEPSIS)
1000.0000 mL | Freq: Once | INTRAVENOUS | Status: AC
  Administered 2017-08-24: 1000 mL via INTRAVENOUS

## 2017-08-24 MED ORDER — PIPERACILLIN-TAZOBACTAM 3.375 G IVPB
3.3750 g | Freq: Three times a day (TID) | INTRAVENOUS | Status: DC
  Administered 2017-08-24 – 2017-08-28 (×10): 3.375 g via INTRAVENOUS
  Filled 2017-08-24 (×12): qty 50

## 2017-08-24 MED ORDER — ACETAMINOPHEN 500 MG PO TABS
1000.0000 mg | ORAL_TABLET | Freq: Once | ORAL | Status: AC
  Administered 2017-08-24: 1000 mg via ORAL
  Filled 2017-08-24: qty 2

## 2017-08-24 NOTE — Progress Notes (Addendum)
Pharmacy Antibiotic Note  Christopher Callahan is a 27 y.o. male admitted on 08/24/2017 with osteomyelitis.  Pharmacy has been consulted for vancomycin and Zosyn dosing. Has been having a diabetic foot ulcer that started ~1 month ago- initially was getting better with PO antibiotics, but now with increased swelling and erythema over the past week.  LA 3.5, SCr up at 1.12 (baseline ~0.7 from historical values)  Plan: Vancomycin 2500mg  IV x1 then 1g IV q8h Zosyn 3.375g IV q8h EI Follow up labs for maintenance orders Follow c/s, clinical progression, renal function, level PRN   Height: 6\' 1"  (185.4 cm) Weight: 235 lb (106.6 kg) IBW/kg (Calculated) : 79.9  Temp (24hrs), Avg:97.9 F (36.6 C), Min:97.9 F (36.6 C), Max:97.9 F (36.6 C)  Recent Labs  Lab 08/24/17 1615 08/24/17 1627  WBC 25.9*  --   LATICACIDVEN  --  3.50*    CrCl cannot be calculated (Patient's most recent lab result is older than the maximum 21 days allowed.).    No Known Allergies  Antimicrobials this admission: Vacnomycin 3/24 >>  Zosyn 3/24 >>   Dose adjustments this admission: n/a  Microbiology results: 3/24 BCx:  3/24 UCx:     Thank you for allowing pharmacy to be a part of this patient's care.  Merlyn Conley D. Janis Cuffe, PharmD, BCPS Clinical Pharmacist 08/24/2017 5:17 PM

## 2017-08-24 NOTE — Progress Notes (Signed)
[facility]

## 2017-08-24 NOTE — ED Provider Notes (Signed)
[facility] PROGRESSIVE CARE Provider Note   CSN: [id number] Arrival date & time: 08/24/17  1601     History   Chief Complaint Chief Complaint  Patient presents with  . Foot Ulcer    HPI Christopher Callahan is a 27 y.o. male.  HPI  27 year old male with a history of type 1 diabetes and hypertension presents with concern for right foot ulcer with worsening pain and generalized body aches.  Patient reports an ulcer on the plantar surface of his right foot which began approximately 3 weeks ago.  Reports he is initially seen in the ED, and was given outpatient antibiotic which he believes was doxycycline, initially with some improvement.  Reports that since then, he has had worsening of symptoms.  About 1 week ago, developed worsening pain in his foot, as well as generalized body aches.  Reports skin color changes beginning last night, as well as blistering of his foot.  He has not had known fevers at home, but has felt achy.  Reports nausea and decreased appetite he has not been eating or drinking much, so has not been taking his insulin.  Denies cough, urinary symptoms.  Past Medical History:  Diagnosis Date  . Diabetes mellitus without complication [facility])     Patient Active Problem List   Diagnosis Date Noted  . Severe sepsis (HCC) 08/24/2017  . Essential hypertension 09/04/2016  . Type 1 diabetes mellitus with diabetic autonomic neuropathy, with long-term current use of insulin (HCC) 09/03/2016  . Diabetic gastroparesis associated with type 1 diabetes mellitus (HCC) 09/03/2016    Past Surgical History:  Procedure Laterality Date  . CHOLECYSTECTOMY    . SKIN GRAFT          Home Medications    Prior to Admission medications   Medication Sig Start Date End Date Taking? Authorizing Provider  insulin aspart (NOVOLOG) 100 UNIT/ML FlexPen Inject 5 Units into the skin 3 (three) times daily. 07/10/16  Yes [provider]  Insulin Detemir (LEVEMIR FLEXTOUCH) 100 UNIT/ML  Pen Inject 24 Units into the skin 2 (two) times daily. 07/10/16  Yes [provider]  dicyclomine (BENTYL) 20 MG tablet Take 1 tablet (20 mg total) by mouth 2 (two) times daily as needed for spasms (abdominal pain). Patient not taking: Reported on 08/24/2017 09/01/16   [name], PA-C  metoCLOPramide (REGLAN) 5 MG tablet Take 1 tablet (5 mg total) by mouth every 8 (eight) hours as needed for nausea. Patient not taking: Reported on 08/24/2017 09/04/16   [name], [name], MD  promethazine (PHENERGAN) 25 MG tablet Take 1 tablet (25 mg total) by mouth every 6 (six) hours as needed for nausea or vomiting. Patient not taking: Reported on 08/24/2017 08/30/16   [name], MD    Family History No family history on file.  Social History Social History   Tobacco Use  . Smoking status: Current Every Day Smoker    Packs/day: 0.30    Types: Cigarettes  . Smokeless tobacco: Never Used  Substance Use Topics  . Alcohol use: No  . Drug use: No     Allergies   Patient has no known allergies.   Review of Systems Review of Systems  Constitutional: Positive for appetite change, chills and fatigue. Negative for fever.  HENT: Negative for sore throat.   Eyes: Negative for visual disturbance.  Respiratory: Negative for shortness of breath.   Cardiovascular: Negative for chest pain.  Gastrointestinal: Positive for nausea. Negative for abdominal pain and vomiting.  Genitourinary:  Negative for difficulty urinating.  Musculoskeletal: Positive for arthralgias and myalgias. Negative for back pain and neck stiffness.  Skin: Positive for wound. Negative for rash.  Neurological: Negative for syncope and headaches.     Physical Exam Updated Vital Signs BP 119/72 (BP Location: Left Arm)   Pulse (!) 121   Temp 98.9 F (37.2 C) (Oral)   Resp 16   Ht 6\' 1"  (1.854 m)   Wt 106.6 kg (235 lb)   SpO2 98%   BMI 31.00 kg/m   Physical Exam  Constitutional: He is oriented to person, place, and time.  He appears well-developed and well-nourished. No distress.  HENT:  Head: Normocephalic and atraumatic.  Eyes: Conjunctivae and EOM are normal.  Neck: Normal range of motion.  Cardiovascular: Regular rhythm, normal heart sounds and intact distal pulses. Tachycardia present. Exam reveals no gallop and no friction rub.  No murmur heard. Pulmonary/Chest: Effort normal and breath sounds normal. No respiratory distress. He has no wheezes. He has no rales.  Abdominal: Soft. He exhibits no distension. There is no tenderness. There is no guarding.  Musculoskeletal: He exhibits no edema.  Neurological: He is alert and oriented to person, place, and time.  Skin: Skin is warm and dry. He is not diaphoretic.  Diabetic foot ulcer/cellulitis right foot see photos below.   Nursing note and vitals reviewed.          ED Treatments / Results  Labs (all labs ordered are listed, but only abnormal results are displayed) Labs Reviewed  COMPREHENSIVE METABOLIC PANEL - Abnormal; Notable for the following components:      Result Value   Sodium 121 (*)    Chloride 87 (*)    CO2 21 (*)    Glucose, Bld 856 (*)    Calcium 7.9 (*)    Albumin 2.3 (*)    ALT 16 (*)    Alkaline Phosphatase 198 (*)    All other components within normal limits  CBC WITH DIFFERENTIAL/PLATELET - Abnormal; Notable for the following components:   WBC 25.9 (*)    HCT 38.7 (*)    Neutro Abs 23.3 (*)    Monocytes Absolute 1.4 (*)    All other components within normal limits  URINALYSIS, ROUTINE W REFLEX MICROSCOPIC - Abnormal; Notable for the following components:   Color, Urine COLORLESS (*)    Glucose, UA >=500 (*)    Hgb urine dipstick SMALL (*)    All other components within normal limits  BASIC METABOLIC PANEL - Abnormal; Notable for the following components:   Sodium 127 (*)    Chloride 95 (*)    CO2 20 (*)    Glucose, Bld 477 (*)    Calcium 7.3 (*)    All other components within normal limits  SEDIMENTATION RATE  - Abnormal; Notable for the following components:   Sed Rate 107 (*)    All other components within normal limits  C-REACTIVE PROTEIN - Abnormal; Notable for the following components:   CRP 29.1 (*)    All other components within normal limits  GLUCOSE, CAPILLARY - Abnormal; Notable for the following components:   Glucose-Capillary 356 (*)    All other components within normal limits  I-STAT CG4 LACTIC ACID, ED - Abnormal; Notable for the following components:   Lactic Acid, Venous 3.50 (*)    All other components within normal limits  I-STAT CG4 LACTIC ACID, ED - Abnormal; Notable for the following components:   Lactic Acid, Venous 1.95 (*)  All other components within normal limits  CBG MONITORING, ED - Abnormal; Notable for the following components:   Glucose-Capillary 456 (*)    All other components within normal limits  CULTURE, BLOOD (ROUTINE X 2)  CULTURE, BLOOD (ROUTINE X 2)  URINE CULTURE  MRSA PCR SCREENING  LACTIC ACID, PLASMA  HEMOGLOBIN A1C  COMPREHENSIVE METABOLIC PANEL  CBC  PROTIME-INR    EKG None  Radiology Dg Foot Complete Right  Result Date: 08/24/2017 CLINICAL DATA:  Diabetic foot ulcer 1 month. EXAM: RIGHT FOOT COMPLETE - 3+ VIEW COMPARISON:  None. FINDINGS: Examination demonstrates mottled air collection within th plantar and dorsal soft tissues adjacent the metatarsals and level of the proximal phalanges most prominent adjacent the second and third toes. Minimal increased lucency over the head of the second and third metatarsals, although no definite bone destruction. No evidence of fracture or dislocation. IMPRESSION: Moderate mottled air collection over the plantar and dorsal aspect of the mid to forefoot region worse adjacent the second and third toes likely soft tissue infection. Possible increased lucency over the head of the second and third metatarsals as osteomyelitis is possible. Electronically Signed   By: Elberta Fortis M.D.   On: 08/24/2017 17:40     Procedures .Critical Care Performed by: Alvira Monday, MD Authorized by: Alvira Monday, MD   Critical care provider statement:    Critical care time (minutes):  30   Critical care was necessary to treat or prevent imminent or life-threatening deterioration of the following conditions:  Sepsis   Critical care was time spent personally by me on the following activities:  Discussions with consultants, evaluation of patient's response to treatment, examination of patient, re-evaluation of patient's condition, ordering and review of radiographic studies, ordering and review of laboratory studies and ordering and performing treatments and interventions   (including critical care time)  Medications Ordered in ED Medications  vancomycin (VANCOCIN) IVPB 1000 mg/200 mL premix (has no administration in time range)  piperacillin-tazobactam (ZOSYN) IVPB 3.375 g (3.375 g Intravenous New Bag/Given 08/24/17 2140)  insulin aspart (novoLOG) injection 0-15 Units (15 Units Subcutaneous Given 08/24/17 1952)  insulin glargine (LANTUS) injection 10 Units (10 Units Subcutaneous Given 08/24/17 2141)  sodium chloride flush (NS) 0.9 % injection 3 mL ( Intravenous Not Given 08/24/17 2142)  lactated ringers infusion ( Intravenous New Bag/Given 08/24/17 2131)  oxyCODONE (Oxy IR/ROXICODONE) immediate release tablet 5 mg (5 mg Oral Given 08/24/17 2003)  sodium chloride 0.9 % bolus 1,000 mL (0 mLs Intravenous Stopped 08/24/17 1957)  sodium chloride 0.9 % bolus 1,000 mL (0 mLs Intravenous Stopped 08/24/17 1957)  sodium chloride 0.9 % bolus 1,000 mL (0 mLs Intravenous Stopped 08/24/17 1957)  vancomycin (VANCOCIN) 2,500 mg in sodium chloride 0.9 % 500 mL IVPB (0 mg Intravenous Stopped 08/24/17 2040)  piperacillin-tazobactam (ZOSYN) IVPB 3.375 g (0 g Intravenous Stopped 08/24/17 1803)  insulin aspart (novoLOG) injection 12 Units (12 Units Intravenous Given 08/24/17 1814)  morphine 4 MG/ML injection 4 mg (4 mg Intravenous  Given 08/24/17 1849)  sodium chloride 0.9 % bolus 1,000 mL (1,000 mLs Intravenous New Bag/Given 08/24/17 1950)  acetaminophen (TYLENOL) tablet 1,000 mg (1,000 mg Oral Given 08/24/17 2003)     Initial Impression / Assessment and Plan / ED Course  I have reviewed the triage vital signs and the nursing notes.  Pertinent labs & imaging results that were available during my care of the patient were reviewed by me and considered in my medical decision making (see chart for details).  27 year old male with a history of type 1 diabetes and hypertension presents with concern for right foot ulcer with worsening pain and generalized body aches.  On arrival to the emergency department, patient afebrile, however tachycardic to the 130s and mildly hypertensive.  Exam concerning for diabetic foot ulcer, with infection, possible wet gangrene.  X-ray shows subcutaneous air, which may be secondary to overlying ulcers and blister, as well as concern for possible osteomyelitis of the second and third toes.  Patient does not have any sign of infection tracking up his lower leg, with no erythema or crepitus in that area.  His white blood cell count is 25,000, with a lactic acid of 3.5, and glucose in the 800s without signs of DKA.  He was given 3-4 L of normal saline, vancomycin and Zosyn.  Discussed with Dr. Everardo Pacific, who is on-call for orthopedics, and who evaluated the photos and x-ray of patient, and will discuss with Dr. Lajoyce Corners.  Blood pressures stable and infection in foot, and do not feel emergent surgery indicated tonight.  Patient admitted to hospitalist service for further care of sepsis secondary to diabetic foot ulcer infection.    Final Clinical Impressions(s) / ED Diagnoses   Final diagnoses:  Diabetic foot infection (HCC)  Sepsis, due to unspecified organism [facility])    ED Discharge Orders    None       Alvira Monday, MD 08/25/17 0010

## 2017-08-24 NOTE — H&P (Signed)
History and Physical   Christopher Callahan WUJ:[id number] DOB: [birth year] DOA: 08/24/2017  PCP: Jacquelynn Cree, PA-C  Chief Complaint:  Right foot wound  HPI: this is a 27 year old man with medical problems including type 1 insulin-dependent diabetes complicated by diabetic neuropathy bilateral feet, diabetic foot wound, questionable gastroparesis, obesity, and ongoing smoking who presents with a worsening right foot wound.  The history is obtained from the patient as well as his mother who accompanies him. The patient reports moving to [facility] [place] yesterday. His mother, reason for moving was to focus on his health until improvement. Formerly, he was living in [facility], working as a Leisure centre manager.  Patient reports hip chronic wound on the bottom of his right foot, sought medical attention and [facility] about 2-3 weeks ago where incision and drainage was performed on the plantar aspect in his forefoot. He was placed on a by mouth antimicrobial agent. However, over the past 2-3 days he has had worsening swelling of his right foot as well as development of blisters, more malodorous, and drainage out of the right plantar forefoot wound. He denies fevers, but does report generalized aches and pain. He denies any chest pain shortness of breath nausea or vomiting. He reports generalized lack of appetite, he has not taken insulin for 6 days. He reports normally taking basal insulin with Levemir 24 units daily, as well as rapid acting insulin 5 units of aspart 3 times a day. She reports not regularly checking his blood sugars, he does not notice last hemoglobin A1c was. He reports having a lack of access to healthcare, his mother reports she is formally on his father's insurance however his father died in 2017/05/01.  The patient reports not having been hospitalized within the past year, only sought emergent medical attention as noted above.  Other details include the  following-he reports to being chronically depressed, citing challenges with his long-standing type 1 diabetes, diagnosed at age 89 years of age. He stopped working as a Leisure centre manager in the past week, he reports drinking about 3 alcoholic beverages 3-4 times a week.  Reports polydipsia, denies polyuria.  ED Course: the emergency department heart rate ranging from 120 to 130s, systolic blood pressure elevated in the 160s. Respiratory rate normal. Temp of 100.1 Fahrenheit. o2 sat within normal limits on room air.  Plain film of the right foot revealed a moderate moderate air collection of the plantar and dorsal aspect of the mid forefoot region worse adjacent the second and third toes-likely soft tissue infection, possible osteomyelitis.  Labs remarkable for leukocytosis to 25,000, neutrophil predominance. Hemoglobin and platelet count normal.  CMP remarkable for glucose of 856, sodium 121, creatinine 1.18. Anion gap 13. Hepatic function panel remarkable for alkaline phosphatase of 198. Lactic acid initially 3.5 this trended down to 1.95.  The emergency department he was treated with broad-spectrum antimicrobial coverage with vancomycin and Pipracil and tazobactam, 12 units of IV and aspart insulin, morphine 4 mg IV, and 4 L of normal saline. Emergency medicine team discussed case with on-call orthopedic specialist recommended nothing by mouth status at midnight and evaluation for consideration of surgical procedure for source control / wound optimization tomorrow.  Review of Systems: A complete ROS was obtained; pertinent positives negatives are denoted in the HPI. Otherwise, all systems are negative.   Past Medical History:  Diagnosis Date  . Diabetes mellitus without complication [facility])    Social History   Socioeconomic History  . Marital status: Single  Spouse name: Not on file  . Number of children: Not on file  . Years of education: Not on file  . Highest education level: Not on file    Occupational History  . Not on file  Social Needs  . Financial resource strain: Not on file  . Food insecurity:    Worry: Not on file    Inability: Not on file  . Transportation needs:    Medical: Not on file    Non-medical: Not on file  Tobacco Use  . Smoking status: Current Every Day Smoker    Packs/day: 0.30    Types: Cigarettes  . Smokeless tobacco: Never Used  Substance and Sexual Activity  . Alcohol use: No  . Drug use: No  . Sexual activity: Not on file  Lifestyle  . Physical activity:    Days per week: Not on file    Minutes per session: Not on file  . Stress: Not on file  Relationships  . Social connections:    Talks on phone: Not on file    Gets together: Not on file    Attends religious service: Not on file    Active member of club or organization: Not on file    Attends meetings of clubs or organizations: Not on file    Relationship status: Not on file  . Intimate partner violence:    Fear of current or ex partner: Not on file    Emotionally abused: Not on file    Physically abused: Not on file    Forced sexual activity: Not on file  Other Topics Concern  . Not on file  Social History Narrative  . Not on file   Family hx: Mother alive at age 26. Father died at age 26  - had COPD.  Physical Exam: Vitals:   08/24/17 1745 08/24/17 1830 08/24/17 1900 08/24/17 1939  BP: (!) 175/89 (!) 164/100 (!) 148/93   Pulse: (!) 137 (!) 139 (!) 131   Resp:      Temp:    100.1 F (37.8 C)  TempSrc:    Oral  SpO2: 99% 100% 100%   Weight:      Height:       General: Young obese black man, appears in mild distress 2/2 to tearfulness and pain.. ENT: Grossly normal hearing, MMM. 1-2 teeth with frank caries, no frank abscess. Cardiovascular: Tachycardic, with HR ~140 bpm. No M/R/G. No LE edema.  Respiratory: CTA bilaterally. No wheezes or crackles. Normal respiratory effort. Abdomen: Soft, generalized tenderness to palpation, no rebound or guarding. Skin: No rash  or induration seen on limited exam. Tattoos present. Left plantar aspect of forefoot with formerly healed wound. Musculoskeletal: Grossly normal tone BUE/BLE. Right foot with swelling and erythema. Scattered purple discoloration present on 2nd toe.  Ruptured bullae over right dorsal foot. Plantar aspect of forefoot with ~1 cm wound, draining minimal serosanguinous; malodorous presence, whitish bordering discoloration present.  Pain does NOT extend proximally to the calf or distal leg. Psychiatric: Depressed. Tearful. Thought process linear, speech goal directed. Oriented to year and hospital. Neurologic: Moves all extremities in coordinated fashion. Distal LEs with impaired sensation to light touch.  I have personally reviewed the following labs, culture data, and imaging studies.  Assessment/Plan:  #Severe sepsis from skin / soft tissue infection of left foot #Osteomyelitis, possible Course: on admission, acute worsening of chronic left plantar aspect of foot. Swelling, erythema, bullae, malodor.  Generalized pain and chills.  Tachycardic to 160s. Diagnostics with WBC >  25,000 (ANC predominance). Initial lactic acid of 3.5. Radiograph of foot concerning for underlying infection, possibly osteomyelitis. Assessment: consistent with severe sepsis from diabetic foot wound, currently SBP > 100, no alteration of mental status; do not believe he has necrotizing fascitis, but infection appears to be severe, possibly limb threatening.  Lactic acid has down-trended but has not yet normalized. Plan: -continue broad spectrum antimicrobial coverage with piperacillin-tazobactam + vancomycin while awaiting for blood culture results -orthopedic team to evaluate in the AM, hopeful that an operative intervention would yield some culprit microbe; will defer to their team as to whether more imaging would be helpful -elevate RLE above level of heart to help with edema and improve wound healing -ABI of RLE to evaluate  blood flow to assess healing candidacy -repeat lactic acid to ensure normaliztion -NPO at midnight -continue isotonic fluids, LR at 150 cc q h x ~16 hours  Other problems -DM type I, insulin dependent with severe hyperglycemia: on admission glucose of 856, AG of 13: suspect uncontrolled DM given his clinical presentation. Plan: will provide rapid acting corrective insulin q 4 hours in hopes of optimizing BS in presence of infection (goal BS 100-180) along with 10 units of basal insulin, A1c to assess control -Hyponatremia: suspect hyper-osmolar related to initial hyperglycemia; Plan: repeat BMP, optimizing hyperglycemia -Obesity: outpatient optimization / weight reduction -Elevated CV risk, likely: can consider lipid profile +/- statin / ASA -Smoking: declines nicotine replacement, continue cessation efforts -Depressed mood: not on pharmacotherapy, continue to assess and consider pharmacologic agent vs counseling support  -Pain: generalized, oxycodone 5 mg as needed q 4 hrs  DVT prophylaxis: unilateral SCD of left lower extremity given presence of infection on RLE and avoiding pharmacologic agents given potential operative intervention and some mild bleeding form wound Code Status: full code Disposition Plan: Anticipate D/C home in 2-5 days Consults called: orthopedic surgery consult called by emergency medicine team Admission status: admit to step-down unit given severe sepsis    Laurell Roof, MD [facility] [phone]  If 7PM-7AM, please contact night-coverage [email or web address] Password [email or web address]

## 2017-08-24 NOTE — ED Notes (Signed)
MD at bedside. 

## 2017-08-24 NOTE — ED Notes (Signed)
Attempted report.  Nurse to call back when available. 

## 2017-08-24 NOTE — ED Notes (Signed)
Dr. Dalene SeltzerSchlossman aware of glucose

## 2017-08-24 NOTE — ED Notes (Signed)
Patient transported to X-ray 

## 2017-08-24 NOTE — ED Triage Notes (Signed)
Reports diabetic foot ulcer that started about a month ago.  States his PCP gave him antibiotics and it seemed to get better initially. Increased in size and swelling over the past week.  Very large wound noted to bottom and top of R foot with black discoloration to 2nd and 3rd toe. Reports generalized pain all over.  Denies fever.

## 2017-08-25 DIAGNOSIS — E11628 Type 2 diabetes mellitus with other skin complications: Secondary | ICD-10-CM

## 2017-08-25 DIAGNOSIS — E872 Acidosis: Secondary | ICD-10-CM

## 2017-08-25 DIAGNOSIS — L97509 Non-pressure chronic ulcer of other part of unspecified foot with unspecified severity: Secondary | ICD-10-CM

## 2017-08-25 DIAGNOSIS — E10621 Type 1 diabetes mellitus with foot ulcer: Secondary | ICD-10-CM

## 2017-08-25 DIAGNOSIS — D72829 Elevated white blood cell count, unspecified: Secondary | ICD-10-CM

## 2017-08-25 DIAGNOSIS — M86171 Other acute osteomyelitis, right ankle and foot: Secondary | ICD-10-CM

## 2017-08-25 DIAGNOSIS — R6520 Severe sepsis without septic shock: Secondary | ICD-10-CM

## 2017-08-25 DIAGNOSIS — L089 Local infection of the skin and subcutaneous tissue, unspecified: Secondary | ICD-10-CM

## 2017-08-25 DIAGNOSIS — A419 Sepsis, unspecified organism: Secondary | ICD-10-CM

## 2017-08-25 LAB — COMPREHENSIVE METABOLIC PANEL
ALBUMIN: 1.9 g/dL — AB (ref 3.5–5.0)
ALK PHOS: 143 U/L — AB (ref 38–126)
ALT: 17 U/L (ref 17–63)
AST: 18 U/L (ref 15–41)
Anion gap: 10 (ref 5–15)
BUN: 11 mg/dL (ref 6–20)
CALCIUM: 7.4 mg/dL — AB (ref 8.9–10.3)
CO2: 22 mmol/L (ref 22–32)
CREATININE: 0.98 mg/dL (ref 0.61–1.24)
Chloride: 99 mmol/L — ABNORMAL LOW (ref 101–111)
GFR calc non Af Amer: >60 mL/min (ref >60)
GLUCOSE: 255 mg/dL — AB (ref 65–99)
Potassium: 3.2 mmol/L — ABNORMAL LOW (ref 3.5–5.1)
SODIUM: 131 mmol/L — AB (ref 135–145)
Total Bilirubin: 0.6 mg/dL (ref 0.3–1.2)
Total Protein: 6 g/dL — ABNORMAL LOW (ref 6.5–8.1)

## 2017-08-25 LAB — CBC
HCT: 31.1 % — ABNORMAL LOW (ref 39.0–52.0)
Hemoglobin: 10.8 g/dL — ABNORMAL LOW (ref 13.0–17.0)
MCH: 27.8 pg (ref 26.0–34.0)
MCHC: 34.7 g/dL (ref 30.0–36.0)
MCV: 79.9 fL (ref 78.0–100.0)
PLATELETS: 290 10*3/uL (ref 150–400)
RBC: 3.89 MIL/uL — ABNORMAL LOW (ref 4.22–5.81)
RDW: 12.8 % (ref 11.5–15.5)
WBC: 20.8 10*3/uL — ABNORMAL HIGH (ref 4.0–10.5)

## 2017-08-25 LAB — GLUCOSE, CAPILLARY
GLUCOSE-CAPILLARY: 249 mg/dL — AB (ref 65–99)
GLUCOSE-CAPILLARY: 302 mg/dL — AB (ref 65–99)
GLUCOSE-CAPILLARY: 302 mg/dL — AB (ref 65–99)
Glucose-Capillary: 230 mg/dL — ABNORMAL HIGH (ref 65–99)
Glucose-Capillary: 243 mg/dL — ABNORMAL HIGH (ref 65–99)
Glucose-Capillary: 297 mg/dL — ABNORMAL HIGH (ref 65–99)

## 2017-08-25 LAB — PROTIME-INR
INR: 1.49
Prothrombin Time: 17.9 seconds — ABNORMAL HIGH (ref 11.4–15.2)

## 2017-08-25 LAB — URINE CULTURE: Culture: NO GROWTH

## 2017-08-25 LAB — MRSA PCR SCREENING: MRSA by PCR: POSITIVE — AB

## 2017-08-25 LAB — HEMOGLOBIN A1C
Hgb A1c MFr Bld: 11.1 % — ABNORMAL HIGH (ref 4.8–5.6)
Mean Plasma Glucose: 271.87 mg/dL

## 2017-08-25 MED ORDER — INSULIN ASPART 100 UNIT/ML ~~LOC~~ SOLN
3.0000 [IU] | Freq: Three times a day (TID) | SUBCUTANEOUS | Status: DC
  Administered 2017-08-25 – 2017-08-26 (×3): 3 [IU] via SUBCUTANEOUS

## 2017-08-25 MED ORDER — POTASSIUM CHLORIDE CRYS ER 20 MEQ PO TBCR
40.0000 meq | EXTENDED_RELEASE_TABLET | Freq: Once | ORAL | Status: AC
  Administered 2017-08-25: 40 meq via ORAL
  Filled 2017-08-25: qty 2

## 2017-08-25 MED ORDER — SODIUM CHLORIDE 0.9 % IV BOLUS (SEPSIS): 1000.0000 mL | Freq: Once | INTRAVENOUS | Status: DC

## 2017-08-25 MED ORDER — LACTATED RINGERS IV SOLN
INTRAVENOUS | Status: AC
  Administered 2017-08-25 – 2017-08-26 (×2): via INTRAVENOUS

## 2017-08-25 MED ORDER — MORPHINE SULFATE (PF) 2 MG/ML IV SOLN
2.0000 mg | INTRAVENOUS | Status: DC | PRN
  Administered 2017-08-25 – 2017-08-27 (×12): 2 mg via INTRAVENOUS
  Filled 2017-08-25 (×12): qty 1

## 2017-08-25 MED ORDER — ACETAMINOPHEN 325 MG PO TABS
650.0000 mg | ORAL_TABLET | Freq: Four times a day (QID) | ORAL | Status: DC | PRN
Start: 2017-08-25 — End: 2017-08-26
  Administered 2017-08-25 – 2017-08-26 (×5): 650 mg via ORAL
  Filled 2017-08-25 (×5): qty 2

## 2017-08-25 MED ORDER — KETOROLAC TROMETHAMINE 30 MG/ML IJ SOLN
30.0000 mg | Freq: Once | INTRAMUSCULAR | Status: AC
  Filled 2017-08-25: qty 1

## 2017-08-25 MED ORDER — INSULIN GLARGINE 100 UNIT/ML ~~LOC~~ SOLN
10.0000 [IU] | Freq: Two times a day (BID) | SUBCUTANEOUS | Status: DC
  Administered 2017-08-25 – 2017-08-27 (×5): 10 [IU] via SUBCUTANEOUS
  Filled 2017-08-25 (×7): qty 0.1

## 2017-08-25 MED ORDER — ENOXAPARIN SODIUM 40 MG/0.4ML ~~LOC~~ SOLN
40.0000 mg | SUBCUTANEOUS | Status: DC
  Administered 2017-08-25 – 2017-09-03 (×8): 40 mg via SUBCUTANEOUS
  Filled 2017-08-25 (×8): qty 0.4

## 2017-08-25 MED ORDER — CHLORHEXIDINE GLUCONATE CLOTH 2 % EX PADS
6.0000 | MEDICATED_PAD | Freq: Every day | CUTANEOUS | Status: AC
  Administered 2017-08-25 – 2017-08-29 (×5): 6 via TOPICAL

## 2017-08-25 MED ORDER — OXYCODONE HCL 5 MG PO TABS
5.0000 mg | ORAL_TABLET | ORAL | Status: DC | PRN
  Administered 2017-08-25 – 2017-08-27 (×9): 10 mg via ORAL
  Administered 2017-08-27: 5 mg via ORAL
  Administered 2017-08-27: 10 mg via ORAL
  Filled 2017-08-25 (×11): qty 2

## 2017-08-25 MED ORDER — MUPIROCIN 2 % EX OINT
1.0000 "application " | TOPICAL_OINTMENT | Freq: Two times a day (BID) | CUTANEOUS | Status: AC
  Administered 2017-08-25 – 2017-08-29 (×10): 1 via NASAL
  Filled 2017-08-25 (×4): qty 22

## 2017-08-25 NOTE — H&P (View-Only) (Signed)
ORTHOPAEDIC CONSULTATION  REQUESTING PHYSICIAN: Glade Lloyd, MD  Chief Complaint: Cellulitis ulceration right foot.  HPI: Christopher Callahan is a 27 y.o. male who presents with cellulitis ulceration right foot.  Patient states that there was an attempt for wound care with oral antibiotics.  Patient has uncontrolled diabetic neuropathy.  Past Medical History:  Diagnosis Date  . Diabetes mellitus without complication [facility])    Past Surgical History:  Procedure Laterality Date  . CHOLECYSTECTOMY    . SKIN GRAFT     Social History   Socioeconomic History  . Marital status: Single    Spouse name: Not on file  . Number of children: Not on file  . Years of education: Not on file  . Highest education level: Not on file  Occupational History  . Not on file  Social Needs  . Financial resource strain: Not on file  . Food insecurity:    Worry: Not on file    Inability: Not on file  . Transportation needs:    Medical: Not on file    Non-medical: Not on file  Tobacco Use  . Smoking status: Current Every Day Smoker    Packs/day: 0.30    Types: Cigarettes  . Smokeless tobacco: Never Used  Substance and Sexual Activity  . Alcohol use: No  . Drug use: No  . Sexual activity: Not on file  Lifestyle  . Physical activity:    Days per week: Not on file    Minutes per session: Not on file  . Stress: Not on file  Relationships  . Social connections:    Talks on phone: Not on file    Gets together: Not on file    Attends religious service: Not on file    Active member of club or organization: Not on file    Attends meetings of clubs or organizations: Not on file    Relationship status: Not on file  Other Topics Concern  . Not on file  Social History Narrative  . Not on file   No family history on file. - negative except otherwise stated in the family history section No Known Allergies Prior to Admission medications   Medication Sig Start Date End Date Taking? Authorizing  Provider  insulin aspart (NOVOLOG) 100 UNIT/ML FlexPen Inject 5 Units into the skin 3 (three) times daily. 07/10/16  Yes [provider]  Insulin Detemir (LEVEMIR FLEXTOUCH) 100 UNIT/ML Pen Inject 24 Units into the skin 2 (two) times daily. 07/10/16  Yes [provider]  dicyclomine (BENTYL) 20 MG tablet Take 1 tablet (20 mg total) by mouth 2 (two) times daily as needed for spasms (abdominal pain). Patient not taking: Reported on 08/24/2017 09/01/16   [name], PA-C  metoCLOPramide (REGLAN) 5 MG tablet Take 1 tablet (5 mg total) by mouth every 8 (eight) hours as needed for nausea. Patient not taking: Reported on 08/24/2017 09/04/16   [name], [name], MD  promethazine (PHENERGAN) 25 MG tablet Take 1 tablet (25 mg total) by mouth every 6 (six) hours as needed for nausea or vomiting. Patient not taking: Reported on 08/24/2017 08/30/16   [name], MD   Dg Foot Complete Right  Result Date: 08/24/2017 CLINICAL DATA:  Diabetic foot ulcer 1 month. EXAM: RIGHT FOOT COMPLETE - 3+ VIEW COMPARISON:  None. FINDINGS: Examination demonstrates mottled air collection within th plantar and dorsal soft tissues adjacent the metatarsals and level of the proximal phalanges most prominent adjacent the second and third toes. Minimal increased lucency  over the head of the second and third metatarsals, although no definite bone destruction. No evidence of fracture or dislocation. IMPRESSION: Moderate mottled air collection over the plantar and dorsal aspect of the mid to forefoot region worse adjacent the second and third toes likely soft tissue infection. Possible increased lucency over the head of the second and third metatarsals as osteomyelitis is possible. Electronically Signed   By: Elberta Fortisaniel  Boyle M.D.   On: 08/24/2017 17:40   - pertinent xrays, CT, MRI studies were reviewed and independently interpreted  Positive ROS: All other systems have been reviewed and were otherwise negative with the exception of  those mentioned in the HPI and as above.  Physical Exam: General: Alert, no acute distress Psychiatric: Patient is competent for consent with normal mood and affect Lymphatic: No axillary or cervical lymphadenopathy Cardiovascular: No pedal edema Respiratory: No cyanosis, no use of accessory musculature GI: No organomegaly, abdomen is soft and non-tender  Skin: Examination patient has blistering and ulceration both plantarly and dorsally of the right foot up to the hindfoot.  The calf is soft tender to touch there is no crepitation no signs of ascending necrotizing fasciitis.  Images:  @ENCIMAGES @   Neurologic: Patient does not have protective sensation bilateral lower extremities.   MUSCULOSKELETAL:  Patient has a palpable posterior tibial pulse on the right.  He has ulceration and blistering extending up to the hindfoot.  Radiographs shows air in the soft tissue throughout the forefoot and midfoot both plantarly and dorsally.  Assessment: Assessment: Diabetic insensate neuropathy with ulceration osteomyelitis and air in the soft tissue throughout the forefoot and midfoot right foot.  Plan: Plan: Patient does not have any foot salvage options.  He does have a palpable pulse and should be able to heal a transtibial amputation.  Recommend continue IV antibiotics and plan for a transtibial amputation on Wednesday.  Thank you for the consult and the opportunity to see Christopher Callahan  Lorana Maffeo, MD [facility] [phone] 7:48 AM

## 2017-08-25 NOTE — Progress Notes (Signed)
Inpatient Diabetes Program Recommendations  AACE/ADA: New Consensus Statement on Inpatient Glycemic Control (2015)  Target Ranges:  Prepandial:   less than 140 mg/dL      Peak postprandial:   less than 180 mg/dL (1-2 hours)      Critically ill patients:  140 - 180 mg/dL   Lab Results  Component Value Date   GLUCAP 230 (H) 08/25/2017   HGBA1C 11.1 (H) 08/25/2017    Spoke with patient regarding outpatient management of diabetes. Patient admits to not taking medications or checking BS. Patient states, " I have had so much happen in the last few months with my dad dying that I haven't been able to do what I know I need to."  Patient has had diabetes since he was 27 year old and very comfortable with self injecting.  Discussed the importance of giving injections. Reviewed patient's current A1c of 11.1%. Explained what a A1c is and what it measures. Also reviewed goal A1c with patient, importance of good glucose control @ home, and blood sugar goals. Reviewed pathophysiology of DM, the need for insulin, long term co-morbidities, associated conditions such as gastroparesis and diabetic neuropathy.   Patient is requesting to return in the AM to come speak with his mother. He plans to live with her following hospitalization and is his main support. He will not have insurance at discharge, as this was from his father who passed in Nov. 2018. Given this it may be appropriate for Novolin 70/30 which is 25$ per vial.   Placing a case management consult for new PCP and medication assistance.   Blood glucose meter kit (includes lancets and strips) ([id number])  Thanks, Bronson Curb, MSN, RNC-OB Diabetes Coordinator [phone] (8a-5p)

## 2017-08-25 NOTE — Progress Notes (Signed)
Inpatient Diabetes Program Recommendations  AACE/ADA: New Consensus Statement on Inpatient Glycemic Control (2015)  Target Ranges:  Prepandial:   less than 140 mg/dL      Peak postprandial:   less than 180 mg/dL (1-2 hours)      Critically ill patients:  140 - 180 mg/dL   Lab Results  Component Value Date   GLUCAP 243 (H) 08/25/2017   HGBA1C 11.1 (H) 08/25/2017    Review of Glycemic Control Results for Christopher Callahan, Christopher Callahan (MRN [id number]) as of 08/25/2017 08:58  Ref. Range 08/25/2017 00:08 08/25/2017 04:22 08/25/2017 07:44  Glucose-Capillary Latest Ref Range: 65 - 99 mg/dL 914297 (H) 782249 (H) 956243 (H)   Diabetes history: Type 1DM Outpatient Diabetes medications: Levemir 24 units BID, Novolog 5 units TID Current orders for Inpatient glycemic control: Novolog 0-15 units Q4H, Lantus 10 units QHS  Inpatient Diabetes Program Recommendations:     Would recommend increasing patient's basal based off of 20% reduction in home dose. Consider Lantus 10 units BID. Also, consider Novolog 3 units TID and Novolog 0-5 units QHS.  Will plan to see patient today.  Thanks, Lujean RaveLauren Dalya Maselli, MSN, RNC-OB Diabetes Coordinator [phone] (8a-5p)

## 2017-08-25 NOTE — Consult Note (Signed)
ORTHOPAEDIC CONSULTATION  REQUESTING PHYSICIAN: Glade Lloyd, MD  Chief Complaint: Cellulitis ulceration right foot.  HPI: Christopher Callahan is a 27 y.o. male who presents with cellulitis ulceration right foot.  Patient states that there was an attempt for wound care with oral antibiotics.  Patient has uncontrolled diabetic neuropathy.  Past Medical History:  Diagnosis Date  . Diabetes mellitus without complication [facility])    Past Surgical History:  Procedure Laterality Date  . CHOLECYSTECTOMY    . SKIN GRAFT     Social History   Socioeconomic History  . Marital status: Single    Spouse name: Not on file  . Number of children: Not on file  . Years of education: Not on file  . Highest education level: Not on file  Occupational History  . Not on file  Social Needs  . Financial resource strain: Not on file  . Food insecurity:    Worry: Not on file    Inability: Not on file  . Transportation needs:    Medical: Not on file    Non-medical: Not on file  Tobacco Use  . Smoking status: Current Every Day Smoker    Packs/day: 0.30    Types: Cigarettes  . Smokeless tobacco: Never Used  Substance and Sexual Activity  . Alcohol use: No  . Drug use: No  . Sexual activity: Not on file  Lifestyle  . Physical activity:    Days per week: Not on file    Minutes per session: Not on file  . Stress: Not on file  Relationships  . Social connections:    Talks on phone: Not on file    Gets together: Not on file    Attends religious service: Not on file    Active member of club or organization: Not on file    Attends meetings of clubs or organizations: Not on file    Relationship status: Not on file  Other Topics Concern  . Not on file  Social History Narrative  . Not on file   No family history on file. - negative except otherwise stated in the family history section No Known Allergies Prior to Admission medications   Medication Sig Start Date End Date Taking? Authorizing  Provider  insulin aspart (NOVOLOG) 100 UNIT/ML FlexPen Inject 5 Units into the skin 3 (three) times daily. 07/10/16  Yes [provider]  Insulin Detemir (LEVEMIR FLEXTOUCH) 100 UNIT/ML Pen Inject 24 Units into the skin 2 (two) times daily. 07/10/16  Yes [provider]  dicyclomine (BENTYL) 20 MG tablet Take 1 tablet (20 mg total) by mouth 2 (two) times daily as needed for spasms (abdominal pain). Patient not taking: Reported on 08/24/2017 09/01/16   [name], PA-C  metoCLOPramide (REGLAN) 5 MG tablet Take 1 tablet (5 mg total) by mouth every 8 (eight) hours as needed for nausea. Patient not taking: Reported on 08/24/2017 09/04/16   [name], [name], MD  promethazine (PHENERGAN) 25 MG tablet Take 1 tablet (25 mg total) by mouth every 6 (six) hours as needed for nausea or vomiting. Patient not taking: Reported on 08/24/2017 08/30/16   [name], MD   Dg Foot Complete Right  Result Date: 08/24/2017 CLINICAL DATA:  Diabetic foot ulcer 1 month. EXAM: RIGHT FOOT COMPLETE - 3+ VIEW COMPARISON:  None. FINDINGS: Examination demonstrates mottled air collection within th plantar and dorsal soft tissues adjacent the metatarsals and level of the proximal phalanges most prominent adjacent the second and third toes. Minimal increased lucency  over the head of the second and third metatarsals, although no definite bone destruction. No evidence of fracture or dislocation. IMPRESSION: Moderate mottled air collection over the plantar and dorsal aspect of the mid to forefoot region worse adjacent the second and third toes likely soft tissue infection. Possible increased lucency over the head of the second and third metatarsals as osteomyelitis is possible. Electronically Signed   By: Elberta Fortisaniel  Boyle M.D.   On: 08/24/2017 17:40   - pertinent xrays, CT, MRI studies were reviewed and independently interpreted  Positive ROS: All other systems have been reviewed and were otherwise negative with the exception of  those mentioned in the HPI and as above.  Physical Exam: General: Alert, no acute distress Psychiatric: Patient is competent for consent with normal mood and affect Lymphatic: No axillary or cervical lymphadenopathy Cardiovascular: No pedal edema Respiratory: No cyanosis, no use of accessory musculature GI: No organomegaly, abdomen is soft and non-tender  Skin: Examination patient has blistering and ulceration both plantarly and dorsally of the right foot up to the hindfoot.  The calf is soft tender to touch there is no crepitation no signs of ascending necrotizing fasciitis.  Images:  @ENCIMAGES @   Neurologic: Patient does not have protective sensation bilateral lower extremities.   MUSCULOSKELETAL:  Patient has a palpable posterior tibial pulse on the right.  He has ulceration and blistering extending up to the hindfoot.  Radiographs shows air in the soft tissue throughout the forefoot and midfoot both plantarly and dorsally.  Assessment: Assessment: Diabetic insensate neuropathy with ulceration osteomyelitis and air in the soft tissue throughout the forefoot and midfoot right foot.  Plan: Plan: Patient does not have any foot salvage options.  He does have a palpable pulse and should be able to heal a transtibial amputation.  Recommend continue IV antibiotics and plan for a transtibial amputation on Wednesday.  Thank you for the consult and the opportunity to see Mr. Christopher Callahan  Samatha Anspach, MD [facility] [phone] 7:48 AM

## 2017-08-25 NOTE — Progress Notes (Signed)
Right foot dressing changed for the second time this shift.

## 2017-08-25 NOTE — Progress Notes (Signed)
Patient ID: Christopher Callahan, male   DOB: 21-Feb-1991, 27 y.o.   MRN: [id number]  PROGRESS NOTE    Million Maharaj  WUJ:[id number] DOB: [birth year] DOA: 08/24/2017 PCP: Jacquelynn Cree, PA-C   Brief Narrative: 27 year old male with history of diabetes mellitus type 1 complicated by diabetic neuropathy of bilateral feet, diabetic foot wound, questionable gastroparesis and ongoing tobacco use presented with worsening right foot wound.  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  He was started on broad-spectrum antibiotics and orthopedics was consulted.   Assessment & Plan:   Active Problems:   Severe sepsis (HCC)   Diabetic foot infection (HCC)   Sepsis (HCC)  Severe sepsis probably due to right foot osteomyelitis -Antibiotic plan is below.  Still spiking temperatures.  Follow cultures.  Repeat a.m. Labs  Right foot acute osteomyelitis -Orthopedics following: Probable plan for amputation on Wednesday -Continue broad-spectrum intravenous antibiotics.  Follow cultures -Continue pain management  Leukocytosis -Probably secondary to above; improving.  Repeat a.m. Labs  Lactic acidosis -Secondary to above.  Resolved  Diabetes mellitus type 1 uncontrolled with hyperglycemia -Hemoglobin A1c is 11.1.  Change Lantus to 10 units subcu twice daily and add NovoLog with meals.  Continue Accu-Cheks with insulin sliding scale coverage  Hyponatremia -Probably secondary to hyperglycemia and dehydration -Improving.  Repeat a.m. labs  Ongoing tobacco use -Patient was counseled regarding cessation  Obesity -Outpatient follow-up  Hypokalemia Replace.  Repeat a.m. labs   DVT prophylaxis: Start Lovenox Code Status: Full Family Communication: Spoke to mother at bedside Disposition Plan: Depends on clinical outcome  Consultants: Orthopedics  Procedures: None  Antimicrobials: Vancomycin and Zosyn from 08/24/2017 onwards   Subjective: Examined at  bedside.  He complains of right foot pain.  Still having temperatures.  Had one episode of vomiting this morning.  Objective: Vitals:   08/25/17 0421 08/25/17 0445 08/25/17 0526 08/25/17 1322  BP:      Pulse:      Resp:      Temp: (!) 100.9 F (38.3 C) (!) 100.8 F (38.2 C) 99.2 F (37.3 C) (!) 101.1 F (38.4 C)  TempSrc: Oral Oral Oral Oral  SpO2:      Weight:      Height:        Intake/Output Summary (Last 24 hours) at 08/25/2017 1404 Last data filed at 08/25/2017 0807 Gross per 24 hour  Intake 3553 ml  Output 900 ml  Net 2653 ml   Filed Weights   08/24/17 1606  Weight: 106.6 kg (235 lb)    Examination:  General exam: Appears in mild distress secondary to pain. Respiratory system: Bilateral decreased breath sound at bases Cardiovascular system: S1 & S2 heard, rate controlled  gastrointestinal system: Abdomen is nondistended, soft and nontender. Normal bowel sounds heard. Central nervous system: Alert and oriented. No focal neurological deficits. Moving extremities Extremities: No cyanosis, clubbing, edema  Skin: Right foot with swelling and erythema with discoloration and blistering and ulceration over the right dorsal and plantar foot Psychiatry: Flat affect.  Depressed   Data Reviewed: I have personally reviewed following labs and imaging studies  CBC: Recent Labs  Lab 08/24/17 1615 08/25/17 0632  WBC 25.9* 20.8*  NEUTROABS 23.3*  --   HGB 13.1 10.8*  HCT 38.7* 31.1*  MCV 83.9 79.9  PLT 322 290   Basic Metabolic Panel: Recent Labs  Lab 08/24/17 1615 08/24/17 1933 08/25/17 0632  NA 121* 127* 131*  K 4.2 3.6 3.2*  CL 87* 95* 99*  CO2 21* 20* 22  GLUCOSE 856* 477* 255*  BUN 16 13 11   CREATININE 1.18 0.86 0.98  CALCIUM 7.9* 7.3* 7.4*   GFR: Estimated Creatinine Clearance: 147.7 mL/min (by C-G formula based on SCr of 0.98 mg/dL). Liver Function Tests: Recent Labs  Lab 08/24/17 1615 08/25/17 0632  AST 19 18  ALT 16* 17  ALKPHOS 198* 143*    BILITOT 0.8 0.6  PROT 7.1 6.0*  ALBUMIN 2.3* 1.9*   No results for input(s): LIPASE, AMYLASE in the last 168 hours. No results for input(s): AMMONIA in the last 168 hours. Coagulation Profile: Recent Labs  Lab 08/25/17 0632  INR 1.49   Cardiac Enzymes: No results for input(s): CKTOTAL, CKMB, CKMBINDEX, TROPONINI in the last 168 hours. BNP (last 3 results) No results for input(s): PROBNP in the last 8760 hours. HbA1C: Recent Labs    08/25/17 0632  HGBA1C 11.1*   CBG: Recent Labs  Lab 08/24/17 2107 08/25/17 0008 08/25/17 0422 08/25/17 0744 08/25/17 1138  GLUCAP 356* 297* 249* 243* 230*   Lipid Profile: No results for input(s): CHOL, HDL, LDLCALC, TRIG, CHOLHDL, LDLDIRECT in the last 72 hours. Thyroid Function Tests: No results for input(s): TSH, T4TOTAL, FREET4, T3FREE, THYROIDAB in the last 72 hours. Anemia Panel: No results for input(s): VITAMINB12, FOLATE, FERRITIN, TIBC, IRON, RETICCTPCT in the last 72 hours. Sepsis Labs: Recent Labs  Lab 08/24/17 1627 08/24/17 1838 08/24/17 2151  LATICACIDVEN 3.50* 1.95* 1.9    Recent Results (from the past 240 hour(s))  Blood culture (routine x 2)     Status: None (Preliminary result)   Collection Time: 08/24/17  4:15 PM  Result Value Ref Range Status   Specimen Description BLOOD LEFT ANTECUBITAL  Final   Special Requests AEROBIC BOTTLE ONLY Blood Culture adequate volume  Final   Culture   Final    NO GROWTH < 24 HOURS Performed at [facility], [address] [place], [place] [address]    Report Status PENDING  Incomplete  Blood culture (routine x 2)     Status: None (Preliminary result)   Collection Time: 08/24/17  5:19 PM  Result Value Ref Range Status   Specimen Description BLOOD RIGHT ANTECUBITAL  Final   Special Requests   Final    BOTTLES DRAWN AEROBIC AND ANAEROBIC Blood Culture adequate volume   Culture   Final    NO GROWTH < 24 HOURS Performed at [facility], [address]  [place], [place] [address]    Report Status PENDING  Incomplete  MRSA PCR Screening     Status: Abnormal   Collection Time: 08/24/17  9:26 PM  Result Value Ref Range Status   MRSA by PCR POSITIVE (A) NEGATIVE Final    Comment:        The GeneXpert MRSA Assay (FDA approved for NASAL specimens only), is one component of a comprehensive MRSA colonization surveillance program. It is not intended to diagnose MRSA infection nor to guide or monitor treatment for MRSA infections. RESULT CALLED TO, READ BACK BY AND VERIFIED WITH: ROMESBERG,K RN 0125 08/25/17 MITCHELL,L Performed at [facility], [address] [place], [place] [address]          Radiology Studies: Dg Foot Complete Right  Result Date: 08/24/2017 CLINICAL DATA:  Diabetic foot ulcer 1 month. EXAM: RIGHT FOOT COMPLETE - 3+ VIEW COMPARISON:  None. FINDINGS: Examination demonstrates mottled air collection within th plantar and dorsal soft tissues adjacent the metatarsals and level of the  proximal phalanges most prominent adjacent the second and third toes. Minimal increased lucency over the head of the second and third metatarsals, although no definite bone destruction. No evidence of fracture or dislocation. IMPRESSION: Moderate mottled air collection over the plantar and dorsal aspect of the mid to forefoot region worse adjacent the second and third toes likely soft tissue infection. Possible increased lucency over the head of the second and third metatarsals as osteomyelitis is possible. Electronically Signed   By: Elberta Fortis M.D.   On: 08/24/2017 17:40        Scheduled Meds: . Chlorhexidine Gluconate Cloth  6 each Topical Q0600  . insulin aspart  0-15 Units Subcutaneous Q4H  . insulin glargine  10 Units Subcutaneous QHS  . ketorolac  30 mg Intravenous Once  . mupirocin ointment  1 application Nasal BID  . sodium chloride flush  3 mL Intravenous Q12H   Continuous Infusions: . piperacillin-tazobactam (ZOSYN)  IV  3.375 g (08/25/17 1246)  . sodium chloride    . vancomycin Stopped (08/25/17 1610)     LOS: 1 day        Glade Lloyd, MD [facility] Pager [phone]  If 7PM-7AM, please contact night-coverage [email or web address] Password [facility] 08/25/2017, 2:04 PM

## 2017-08-25 NOTE — Progress Notes (Signed)
Patient arrived to 5W15 from ED. A&Ox4 with VS stable. Stepdown monitor applied and second verified. Skin assessment completed by 2 RNs. CHG bath completed and MRSA PCR completed. Patient in no distress at this time. Will continue to monitor and treat per MD orders.

## 2017-08-26 ENCOUNTER — Inpatient Hospital Stay (HOSPITAL_COMMUNITY): Payer: Federal, State, Local not specified - PPO

## 2017-08-26 ENCOUNTER — Other Ambulatory Visit (INDEPENDENT_AMBULATORY_CARE_PROVIDER_SITE_OTHER): Payer: Self-pay | Admitting: Orthopedic Surgery

## 2017-08-26 DIAGNOSIS — M8610 Other acute osteomyelitis, unspecified site: Secondary | ICD-10-CM

## 2017-08-26 DIAGNOSIS — M86271 Subacute osteomyelitis, right ankle and foot: Secondary | ICD-10-CM

## 2017-08-26 LAB — GLUCOSE, CAPILLARY
GLUCOSE-CAPILLARY: 199 mg/dL — AB (ref 65–99)
GLUCOSE-CAPILLARY: 175 mg/dL — AB (ref 65–99)
GLUCOSE-CAPILLARY: 250 mg/dL — AB (ref 65–99)
GLUCOSE-CAPILLARY: 229 mg/dL — AB (ref 65–99)
GLUCOSE-CAPILLARY: 306 mg/dL — AB (ref 65–99)
Glucose-Capillary: 182 mg/dL — ABNORMAL HIGH (ref 65–99)

## 2017-08-26 LAB — VANCOMYCIN, TROUGH: Vancomycin Tr: 17 ug/mL (ref 15–20)

## 2017-08-26 LAB — CBC WITH DIFFERENTIAL/PLATELET
Basophils Absolute: 0 10*3/uL (ref 0.0–0.1)
Basophils Relative: 0 %
Eosinophils Absolute: 0.1 10*3/uL (ref 0.0–0.7)
Eosinophils Relative: 1 %
HCT: 30 % — ABNORMAL LOW (ref 39.0–52.0)
Hemoglobin: 10.3 g/dL — ABNORMAL LOW (ref 13.0–17.0)
LYMPHS ABS: 1.7 10*3/uL (ref 0.7–4.0)
LYMPHS PCT: 9 %
MCH: 27.5 pg (ref 26.0–34.0)
MCHC: 34.3 g/dL (ref 30.0–36.0)
MCV: 80.2 fL (ref 78.0–100.0)
MONO ABS: 2 10*3/uL — AB (ref 0.1–1.0)
MONOS PCT: 10 %
Neutro Abs: 15.7 10*3/uL — ABNORMAL HIGH (ref 1.7–7.7)
Neutrophils Relative %: 80 %
PLATELETS: 275 10*3/uL (ref 150–400)
RBC: 3.74 MIL/uL — ABNORMAL LOW (ref 4.22–5.81)
RDW: 12.9 % (ref 11.5–15.5)
WBC: 19.5 10*3/uL — ABNORMAL HIGH (ref 4.0–10.5)

## 2017-08-26 LAB — BASIC METABOLIC PANEL
Anion gap: 10 (ref 5–15)
BUN: 12 mg/dL (ref 6–20)
CALCIUM: 7.4 mg/dL — AB (ref 8.9–10.3)
CO2: 22 mmol/L (ref 22–32)
Chloride: 100 mmol/L — ABNORMAL LOW (ref 101–111)
Creatinine, Ser: 1.02 mg/dL (ref 0.61–1.24)
GFR calc Af Amer: >60 mL/min (ref >60)
GLUCOSE: 211 mg/dL — AB (ref 65–99)
Potassium: 3.3 mmol/L — ABNORMAL LOW (ref 3.5–5.1)
Sodium: 132 mmol/L — ABNORMAL LOW (ref 135–145)

## 2017-08-26 LAB — MAGNESIUM: Magnesium: 1.8 mg/dL (ref 1.7–2.4)

## 2017-08-26 MED ORDER — INSULIN ASPART 100 UNIT/ML ~~LOC~~ SOLN
7.0000 [IU] | Freq: Three times a day (TID) | SUBCUTANEOUS | Status: DC
  Administered 2017-08-26 – 2017-08-27 (×3): 7 [IU] via SUBCUTANEOUS

## 2017-08-26 MED ORDER — METOPROLOL TARTRATE 5 MG/5ML IV SOLN
2.5000 mg | INTRAVENOUS | Status: DC | PRN
  Administered 2017-08-26: 2.5 mg via INTRAVENOUS
  Filled 2017-08-26 (×2): qty 5

## 2017-08-26 MED ORDER — POTASSIUM CHLORIDE CRYS ER 20 MEQ PO TBCR
60.0000 meq | EXTENDED_RELEASE_TABLET | Freq: Once | ORAL | Status: AC
  Administered 2017-08-26: 60 meq via ORAL
  Filled 2017-08-26: qty 3

## 2017-08-26 MED ORDER — CHLORHEXIDINE GLUCONATE 4 % EX LIQD
60.0000 mL | Freq: Once | CUTANEOUS | Status: AC
  Administered 2017-08-27: 4 via TOPICAL
  Filled 2017-08-26 (×2): qty 60

## 2017-08-26 MED ORDER — HYDRALAZINE HCL 20 MG/ML IJ SOLN
5.0000 mg | INTRAMUSCULAR | Status: DC | PRN
  Administered 2017-08-26: 5 mg via INTRAVENOUS
  Filled 2017-08-26 (×3): qty 1

## 2017-08-26 MED ORDER — ACETAMINOPHEN 325 MG PO TABS
650.0000 mg | ORAL_TABLET | ORAL | Status: DC | PRN
  Administered 2017-08-26 – 2017-08-27 (×2): 650 mg via ORAL
  Filled 2017-08-26 (×2): qty 2

## 2017-08-26 NOTE — Progress Notes (Signed)
Pharmacy Antibiotic Note  Christopher Callahan is a 27 y.o. male admitted on 08/24/2017 with osteomyelitis.  Pharmacy has been consulted for vancomycin and Zosyn dosing. Has been having a diabetic foot ulcer that started ~1 month ago- initially was getting better with PO antibiotics, but now with increased swelling and erythema over the past week.  LA 3.5, SCr stable at 1.02 (baseline ~0.7 from historical values)  Vancomycin trough at goal today (17 mcg/ml)  Plan: Continue vancomycin 1g IV q 8 hrs. Zosyn 3.375g IV q8h EI Follow up labs for maintenance orders Follow c/s, clinical progression, renal function, level PRN   Height: 6\' 1"  (185.4 cm) Weight: 235 lb (106.6 kg) IBW/kg (Calculated) : 79.9  Temp (24hrs), Avg:100.8 F (38.2 C), Min:99.3 F (37.4 C), Max:102.4 F (39.1 C)  Recent Labs  Lab 08/24/17 1615 08/24/17 1627 08/24/17 1838 08/24/17 1933 08/24/17 2151 08/25/17 0632 08/26/17 0542 08/26/17 1713  WBC 25.9*  --   --   --   --  20.8* 19.5*  --   CREATININE 1.18  --   --  0.86  --  0.98 1.02  --   LATICACIDVEN  --  3.50* 1.95*  --  1.9  --   --   --   VANCOTROUGH  --   --   --   --   --   --   --  17    Estimated Creatinine Clearance: 141.9 mL/min (by C-G formula based on SCr of 1.02 mg/dL).    No Known Allergies  Antimicrobials this admission: Vacnomycin 3/24 >>  Zosyn 3/24 >>   Dose adjustments this admission: 3/26 Vancomycin trough = 17 on 1g q 8 hrs.  Microbiology results: 3/24 BCx: ngtd 3/24 UCx: negative 3/24 MRSA PCR pos   Thank you for allowing pharmacy to be a part of this patient's care.  Tad MooreJessica Arcadia Gorgas, Pharm D, BCPS  Clinical Pharmacist Pager [phone]  08/26/2017 6:50 PM

## 2017-08-26 NOTE — Progress Notes (Signed)
Patient ID: Christopher Callahan, male   DOB: 1991-04-21, 27 y.o.   MRN: [id number]  PROGRESS NOTE    Christopher Callahan  WGN:[id number] DOB: January 15, 1991 DOA: 08/24/2017 PCP: Jacquelynn Cree, PA-C   Brief Narrative: 27 year old male with history of diabetes mellitus type 1 complicated by diabetic neuropathy of bilateral feet, diabetic foot wound, questionable gastroparesis and ongoing tobacco use presented with worsening right foot wound.  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  He was started on broad-spectrum antibiotics and orthopedics was consulted.   Assessment & Plan:   Active Problems:   Severe sepsis (HCC)   Diabetic foot infection (HCC)   Sepsis (HCC)  Severe sepsis probably due to right foot osteomyelitis -Antibiotic plan is below.  Still spiking temperatures and tachycardic.  Follow cultures.  Repeat a.m. Labs  Right foot acute osteomyelitis -Orthopedics following: Probable plan for amputation on 08/27/2017 -Continue vancomycin and Zosyn.  Renal function stable for now.  Monitor.  Follow cultures -Continue pain management  Leukocytosis -Probably secondary to above; mildly improving.  Repeat a.m. Labs  Lactic acidosis -Secondary to above.  Resolved  Diabetes mellitus type 1 uncontrolled with hyperglycemia -Hemoglobin A1c is 11.1.  Continue Lantus and NovoLog.  Continue Accu-Cheks with insulin sliding scale coverage  Hyponatremia -Probably secondary to hyperglycemia and dehydration -Improving.  Repeat a.m. labs  Ongoing tobacco use -Patient was counseled regarding cessation  Obesity -Outpatient follow-up  Hypokalemia Replace.  Repeat a.m. labs   DVT prophylaxis: Start Lovenox Code Status: Full Family Communication: None at bedside Disposition Plan: Depends on clinical outcome  Consultants: Orthopedics  Procedures: None  Antimicrobials: Vancomycin and Zosyn from 08/24/2017 onwards   Subjective: Examined at bedside.   He complains of right foot pain but controlled with pain medications.  No overnight vomiting.  Still having temperature spikes.  Objective: Vitals:   08/26/17 0248 08/26/17 0300 08/26/17 0448 08/26/17 0821  BP: (!) 150/88  (!) 155/97   Pulse: (!) 122  (!) 117   Resp: (!) 21  18   Temp:  (!) 101.8 F (38.8 C)  99.4 F (37.4 C)  TempSrc:  Oral  Oral  SpO2: 96%  96%   Weight:      Height:        Intake/Output Summary (Last 24 hours) at 08/26/2017 0931 Last data filed at 08/26/2017 0856 Gross per 24 hour  Intake 1366.33 ml  Output 1800 ml  Net -433.67 ml   Filed Weights   08/24/17 1606  Weight: 106.6 kg (235 lb)    Examination:  General exam: No acute distress.  Calm and comfortable Respiratory system: Bilateral decreased breath sound at bases Cardiovascular system: S1 & S2 heard, tachycardic gastrointestinal system: Abdomen is nondistended, soft and nontender. Normal bowel sounds heard. Central nervous system: Alert and oriented. No focal neurological deficits. Moving extremities Extremities: No cyanosis, clubbing, edema  Skin: Right foot dressing present Psychiatry: Flat affect   Data Reviewed: I have personally reviewed following labs and imaging studies  CBC: Recent Labs  Lab 08/24/17 1615 08/25/17 0632 08/26/17 0542  WBC 25.9* 20.8* 19.5*  NEUTROABS 23.3*  --  15.7*  HGB 13.1 10.8* 10.3*  HCT 38.7* 31.1* 30.0*  MCV 83.9 79.9 80.2  PLT 322 290 275   Basic Metabolic Panel: Recent Labs  Lab 08/24/17 1615 08/24/17 1933 08/25/17 0632 08/26/17 0542  NA 121* 127* 131* 132*  K 4.2 3.6 3.2* 3.3*  CL 87* 95* 99* 100*  CO2 21* 20* 22 22  GLUCOSE 856* 477* 255* 211*  BUN 16 13 11 12   CREATININE 1.18 0.86 0.98 1.02  CALCIUM 7.9* 7.3* 7.4* 7.4*  MG  --   --   --  1.8   GFR: Estimated Creatinine Clearance: 141.9 mL/min (by C-G formula based on SCr of 1.02 mg/dL). Liver Function Tests: Recent Labs  Lab 08/24/17 1615 08/25/17 0632  AST 19 18  ALT 16* 17    ALKPHOS 198* 143*  BILITOT 0.8 0.6  PROT 7.1 6.0*  ALBUMIN 2.3* 1.9*   No results for input(s): LIPASE, AMYLASE in the last 168 hours. No results for input(s): AMMONIA in the last 168 hours. Coagulation Profile: Recent Labs  Lab 08/25/17 0632  INR 1.49   Cardiac Enzymes: No results for input(s): CKTOTAL, CKMB, CKMBINDEX, TROPONINI in the last 168 hours. BNP (last 3 results) No results for input(s): PROBNP in the last 8760 hours. HbA1C: Recent Labs    08/25/17 0632  HGBA1C 11.1*   CBG: Recent Labs  Lab 08/25/17 1635 08/25/17 2010 08/26/17 0100 08/26/17 0408 08/26/17 0821  GLUCAP 302* 302* 182* 199* 175*   Lipid Profile: No results for input(s): CHOL, HDL, LDLCALC, TRIG, CHOLHDL, LDLDIRECT in the last 72 hours. Thyroid Function Tests: No results for input(s): TSH, T4TOTAL, FREET4, T3FREE, THYROIDAB in the last 72 hours. Anemia Panel: No results for input(s): VITAMINB12, FOLATE, FERRITIN, TIBC, IRON, RETICCTPCT in the last 72 hours. Sepsis Labs: Recent Labs  Lab 08/24/17 1627 08/24/17 1838 08/24/17 2151  LATICACIDVEN 3.50* 1.95* 1.9    Recent Results (from the past 240 hour(s))  Blood culture (routine x 2)     Status: None (Preliminary result)   Collection Time: 08/24/17  4:15 PM  Result Value Ref Range Status   Specimen Description BLOOD LEFT ANTECUBITAL  Final   Special Requests AEROBIC BOTTLE ONLY Blood Culture adequate volume  Final   Culture   Final    NO GROWTH < 24 HOURS Performed at [facility], [address] [place], [place] [address]    Report Status PENDING  Incomplete  Blood culture (routine x 2)     Status: None (Preliminary result)   Collection Time: 08/24/17  5:19 PM  Result Value Ref Range Status   Specimen Description BLOOD RIGHT ANTECUBITAL  Final   Special Requests   Final    BOTTLES DRAWN AEROBIC AND ANAEROBIC Blood Culture adequate volume   Culture   Final    NO GROWTH < 24 HOURS Performed at [facility], [address] [place], [place] [address]    Report Status PENDING  Incomplete  Urine culture     Status: None   Collection Time: 08/24/17  6:50 PM  Result Value Ref Range Status   Specimen Description URINE, RANDOM  Final   Special Requests NONE  Final   Culture   Final    NO GROWTH Performed at [facility], [address] [place], [place] [address]    Report Status 08/25/2017 FINAL  Final  MRSA PCR Screening     Status: Abnormal   Collection Time: 08/24/17  9:26 PM  Result Value Ref Range Status   MRSA by PCR POSITIVE (A) NEGATIVE Final    Comment:        The GeneXpert MRSA Assay (FDA approved for NASAL specimens only), is one component of a comprehensive MRSA colonization surveillance program. It is not intended to diagnose MRSA infection nor to guide or monitor treatment for MRSA infections. RESULT CALLED  TO, READ BACK BY AND VERIFIED WITH: ROMESBERG,K RN [phone] 08/25/17 MITCHELL,L Performed at [facility], [address] [place], [place] [address]          Radiology Studies: Dg Foot Complete Right  Result Date: 08/24/2017 CLINICAL DATA:  Diabetic foot ulcer 1 month. EXAM: RIGHT FOOT COMPLETE - 3+ VIEW COMPARISON:  None. FINDINGS: Examination demonstrates mottled air collection within th plantar and dorsal soft tissues adjacent the metatarsals and level of the proximal phalanges most prominent adjacent the second and third toes. Minimal increased lucency over the head of the second and third metatarsals, although no definite bone destruction. No evidence of fracture or dislocation. IMPRESSION: Moderate mottled air collection over the plantar and dorsal aspect of the mid to forefoot region worse adjacent the second and third toes likely soft tissue infection. Possible increased lucency over the head of the second and third metatarsals as osteomyelitis is possible. Electronically Signed   By: Elberta Fortis M.D.   On: 08/24/2017 17:40        Scheduled Meds: .  Chlorhexidine Gluconate Cloth  6 each Topical Q0600  . enoxaparin (LOVENOX) injection  40 mg Subcutaneous Q24H  . insulin aspart  0-15 Units Subcutaneous Q4H  . insulin aspart  3 Units Subcutaneous TID WC  . insulin glargine  10 Units Subcutaneous BID  . ketorolac  30 mg Intravenous Once  . mupirocin ointment  1 application Nasal BID  . sodium chloride flush  3 mL Intravenous Q12H   Continuous Infusions: . piperacillin-tazobactam (ZOSYN)  IV 3.375 g (08/26/17 4782)  . sodium chloride    . vancomycin 1,000 mg (08/26/17 0854)     LOS: 2 days        Glade Lloyd, MD [facility] Pager [phone]  If 7PM-7AM, please contact night-coverage [email or web address] Password [facility] 08/26/2017, 9:31 AM

## 2017-08-26 NOTE — Progress Notes (Addendum)
Inpatient Diabetes Program Recommendations  AACE/ADA: New Consensus Statement on Inpatient Glycemic Control (2015)  Target Ranges:  Prepandial:   less than 140 mg/dL      Peak postprandial:   less than 180 mg/dL (1-2 hours)      Critically ill patients:  140 - 180 mg/dL   Lab Results  Component Value Date   GLUCAP 250 (H) 08/26/2017   HGBA1C 11.1 (H) 08/25/2017    Review of Glycemic Control Results for Christopher Callahan, Christopher Callahan (MRN [id number]) as of 08/26/2017 12:01  Ref. Range 08/26/2017 04:08 08/26/2017 08:21 08/26/2017 11:44  Glucose-Capillary Latest Ref Range: 65 - 99 mg/dL 199 (H) 175 (H) 250 (H)   Diabetes history: Type 1DM Outpatient Diabetes medications: Levemir 24 units BID, Novolog 5 units TID Current orders for Inpatient glycemic control: Novolog 0-15 units Q4H, Lantus 10 units BId, Novolog 3 units TID  Inpatient Diabetes Program Recommendations:     Consider increasing Novolog to 7 units TID (assuming patient consumes >50%, placed under Glycemic control order set). Also, add Novolog 0-5 units QHS and change correction Novolog 0-15 units to [facility]. Will plan to see patient again today.    Addendum: Spoke with patient again today, per patient's request. Mother now at bedside and will plan to help patient pending discharge. Discussed the importance of giving injections. Reviewed patient's current A1c of 11.1%. Explained what a A1c is and what it measures. Also reviewed goal A1c with patient, importance of good glucose control @ home, and blood sugar goals. Reviewed pathophysiology of DM, the need for insulin, long term co-morbidities, associated conditions such as gastroparesis and diabetic neuropathy.   Will need a meter and encouraged to check BS 2-3 times/day. Blood glucose meter kit (includes lancets and strips) ([id number])  [name]. His plan will continue under [employer] and [employer] until 01/2018. In speaking with the patient, he is requesting  that all insulin remains the same as prior to admission. Discussed with patient that he would need to communicate with his new PCP if he is unable to find additional insurance coverage by August; as the cost would be >$200 for current regimen and that insulin may need to be adjusted. He verbalizes that he understands and plans to take the injections that he needs. At this time, he and mother have no further questions.  Thanks, Bronson Curb, MSN, RNC-OB Diabetes Coordinator [phone] (8a-5p)

## 2017-08-26 NOTE — Progress Notes (Signed)
CSW received consult regarding patient request for help getting health insurance. However, patient currently has insurance so financial counseling unable to assist at this time until policy has termed.  CSW signing off.   Osborne Cascoadia Dhara Schepp LCSW [phone]

## 2017-08-26 NOTE — Progress Notes (Signed)
Patient had a temperature of 101.0 at 1532. Tylenol given. Temperature increased to 101.4 at 1627. MD notified that fever did not resolve and was instructed to give an additional dose of Tylenol now. Nursing instructor and student verified with pharmacy who said it was fine to give additional dose of Tylenol despite it only being about an hour after the previous dose. Temperature then decreased to 100.8. MD notified. Will continue to monitor.

## 2017-08-27 ENCOUNTER — Inpatient Hospital Stay (HOSPITAL_COMMUNITY): Payer: Federal, State, Local not specified - PPO | Admitting: Certified Registered"

## 2017-08-27 ENCOUNTER — Encounter (HOSPITAL_COMMUNITY)
Admission: EM | Disposition: A | Discharge status: Rehabilitation Facility | Payer: Self-pay | Source: Home / Self Care | Attending: Internal Medicine

## 2017-08-27 ENCOUNTER — Encounter (HOSPITAL_COMMUNITY): Payer: Self-pay | Admitting: *Deleted

## 2017-08-27 DIAGNOSIS — M869 Osteomyelitis, unspecified: Secondary | ICD-10-CM

## 2017-08-27 DIAGNOSIS — M86271 Subacute osteomyelitis, right ankle and foot: Secondary | ICD-10-CM

## 2017-08-27 HISTORY — PX: AMPUTATION: SHX166

## 2017-08-27 LAB — BASIC METABOLIC PANEL
Anion gap: 11 (ref 5–15)
BUN: 11 mg/dL (ref 6–20)
CHLORIDE: 98 mmol/L — AB (ref 101–111)
CO2: 24 mmol/L (ref 22–32)
Calcium: 8 mg/dL — ABNORMAL LOW (ref 8.9–10.3)
Creatinine, Ser: 1.01 mg/dL (ref 0.61–1.24)
GFR calc Af Amer: >60 mL/min (ref >60)
GFR calc non Af Amer: >60 mL/min (ref >60)
Glucose, Bld: 190 mg/dL — ABNORMAL HIGH (ref 65–99)
Potassium: 3.9 mmol/L (ref 3.5–5.1)
SODIUM: 133 mmol/L — AB (ref 135–145)

## 2017-08-27 LAB — CBC WITH DIFFERENTIAL/PLATELET
BASOS ABS: 0 10*3/uL (ref 0.0–0.1)
BASOS PCT: 0 %
EOS ABS: 0.2 10*3/uL (ref 0.0–0.7)
EOS PCT: 1 %
HCT: 32.4 % — ABNORMAL LOW (ref 39.0–52.0)
Hemoglobin: 11.2 g/dL — ABNORMAL LOW (ref 13.0–17.0)
Lymphocytes Relative: 9 %
Lymphs Abs: 1.8 10*3/uL (ref 0.7–4.0)
MCH: 28.1 pg (ref 26.0–34.0)
MCHC: 34.6 g/dL (ref 30.0–36.0)
MCV: 81.2 fL (ref 78.0–100.0)
MONO ABS: 1.9 10*3/uL — AB (ref 0.1–1.0)
MONOS PCT: 9 %
Neutro Abs: 17.3 10*3/uL — ABNORMAL HIGH (ref 1.7–7.7)
Neutrophils Relative %: 81 %
PLATELETS: 319 10*3/uL (ref 150–400)
RBC: 3.99 MIL/uL — ABNORMAL LOW (ref 4.22–5.81)
RDW: 13 % (ref 11.5–15.5)
WBC: 21.1 10*3/uL — ABNORMAL HIGH (ref 4.0–10.5)

## 2017-08-27 LAB — GLUCOSE, CAPILLARY
GLUCOSE-CAPILLARY: 167 mg/dL — AB (ref 65–99)
GLUCOSE-CAPILLARY: 139 mg/dL — AB (ref 65–99)
GLUCOSE-CAPILLARY: 366 mg/dL — AB (ref 65–99)
GLUCOSE-CAPILLARY: 382 mg/dL — AB (ref 65–99)
Glucose-Capillary: 143 mg/dL — ABNORMAL HIGH (ref 65–99)
Glucose-Capillary: 192 mg/dL — ABNORMAL HIGH (ref 65–99)
Glucose-Capillary: 207 mg/dL — ABNORMAL HIGH (ref 65–99)
Glucose-Capillary: 236 mg/dL — ABNORMAL HIGH (ref 65–99)
Glucose-Capillary: 411 mg/dL — ABNORMAL HIGH (ref 65–99)

## 2017-08-27 LAB — C-REACTIVE PROTEIN: CRP: 27.6 mg/dL — ABNORMAL HIGH (ref <1.0)

## 2017-08-27 LAB — MAGNESIUM: MAGNESIUM: 2 mg/dL (ref 1.7–2.4)

## 2017-08-27 SURGERY — AMPUTATION BELOW KNEE
Anesthesia: General | Laterality: Right

## 2017-08-27 MED ORDER — 0.9 % SODIUM CHLORIDE (POUR BTL) OPTIME
TOPICAL | Status: DC | PRN
  Administered 2017-08-27: 1000 mL

## 2017-08-27 MED ORDER — OXYCODONE HCL 5 MG PO TABS
10.0000 mg | ORAL_TABLET | ORAL | Status: DC | PRN
  Administered 2017-08-27 – 2017-09-03 (×34): 15 mg via ORAL
  Filled 2017-08-27 (×34): qty 3

## 2017-08-27 MED ORDER — SODIUM CHLORIDE 0.9 % IV SOLN: INTRAVENOUS | Status: DC

## 2017-08-27 MED ORDER — CEFAZOLIN SODIUM-DEXTROSE 2-4 GM/100ML-% IV SOLN
INTRAVENOUS | Status: AC
  Filled 2017-08-27: qty 100

## 2017-08-27 MED ORDER — OXYCODONE HCL 5 MG PO TABS
5.0000 mg | ORAL_TABLET | ORAL | Status: DC | PRN
  Administered 2017-08-27: 10 mg via ORAL

## 2017-08-27 MED ORDER — ONDANSETRON HCL 4 MG PO TABS: 4.0000 mg | ORAL_TABLET | Freq: Four times a day (QID) | ORAL | Status: DC | PRN

## 2017-08-27 MED ORDER — MIDAZOLAM HCL 5 MG/5ML IJ SOLN
INTRAMUSCULAR | Status: DC | PRN
  Administered 2017-08-27: 2 mg via INTRAVENOUS

## 2017-08-27 MED ORDER — EPHEDRINE 5 MG/ML INJ
INTRAVENOUS | Status: AC
  Filled 2017-08-27: qty 10

## 2017-08-27 MED ORDER — KETOROLAC TROMETHAMINE 30 MG/ML IJ SOLN
30.0000 mg | Freq: Once | INTRAMUSCULAR | Status: AC | PRN
  Administered 2017-08-27: 30 mg via INTRAVENOUS

## 2017-08-27 MED ORDER — HYDROMORPHONE HCL 1 MG/ML IJ SOLN
0.2500 mg | INTRAMUSCULAR | Status: DC | PRN
  Administered 2017-08-27 (×4): 0.5 mg via INTRAVENOUS

## 2017-08-27 MED ORDER — DEXAMETHASONE SODIUM PHOSPHATE 10 MG/ML IJ SOLN
INTRAMUSCULAR | Status: AC
  Filled 2017-08-27: qty 1

## 2017-08-27 MED ORDER — FENTANYL CITRATE (PF) 100 MCG/2ML IJ SOLN
INTRAMUSCULAR | Status: DC | PRN
  Administered 2017-08-27 (×5): 50 ug via INTRAVENOUS

## 2017-08-27 MED ORDER — METHOCARBAMOL 1000 MG/10ML IJ SOLN
500.0000 mg | Freq: Four times a day (QID) | INTRAVENOUS | Status: DC | PRN
  Filled 2017-08-27: qty 5

## 2017-08-27 MED ORDER — ROCURONIUM BROMIDE 10 MG/ML (PF) SYRINGE
PREFILLED_SYRINGE | INTRAVENOUS | Status: AC
  Filled 2017-08-27: qty 5

## 2017-08-27 MED ORDER — POLYETHYLENE GLYCOL 3350 17 G PO PACK: 17.0000 g | PACK | Freq: Every day | ORAL | Status: DC | PRN

## 2017-08-27 MED ORDER — LACTATED RINGERS IV SOLN
INTRAVENOUS | Status: DC | PRN
  Administered 2017-08-27: 12:00:00 via INTRAVENOUS

## 2017-08-27 MED ORDER — DOCUSATE SODIUM 100 MG PO CAPS
100.0000 mg | ORAL_CAPSULE | Freq: Two times a day (BID) | ORAL | Status: DC
  Administered 2017-08-27 – 2017-09-03 (×10): 100 mg via ORAL
  Filled 2017-08-27 (×13): qty 1

## 2017-08-27 MED ORDER — MAGNESIUM CITRATE PO SOLN
1.0000 | Freq: Once | ORAL | Status: AC | PRN
  Administered 2017-08-29: 1 via ORAL
  Filled 2017-08-27: qty 296

## 2017-08-27 MED ORDER — ONDANSETRON HCL 4 MG/2ML IJ SOLN: 4.0000 mg | Freq: Four times a day (QID) | INTRAMUSCULAR | Status: DC | PRN

## 2017-08-27 MED ORDER — ONDANSETRON HCL 4 MG/2ML IJ SOLN
INTRAMUSCULAR | Status: AC
  Filled 2017-08-27: qty 2

## 2017-08-27 MED ORDER — BISACODYL 10 MG RE SUPP
10.0000 mg | Freq: Every day | RECTAL | Status: DC | PRN
  Administered 2017-08-29: 10 mg via RECTAL
  Filled 2017-08-27: qty 1

## 2017-08-27 MED ORDER — OXYCODONE HCL 5 MG PO TABS
ORAL_TABLET | ORAL | Status: AC
  Filled 2017-08-27: qty 2

## 2017-08-27 MED ORDER — INSULIN ASPART 100 UNIT/ML ~~LOC~~ SOLN
0.0000 [IU] | Freq: Every day | SUBCUTANEOUS | Status: DC
  Administered 2017-08-27: 5 [IU] via SUBCUTANEOUS
  Administered 2017-08-28: 3 [IU] via SUBCUTANEOUS

## 2017-08-27 MED ORDER — LIDOCAINE 2% (20 MG/ML) 5 ML SYRINGE
INTRAMUSCULAR | Status: DC | PRN
  Administered 2017-08-27: 80 mg via INTRAVENOUS

## 2017-08-27 MED ORDER — KETOROLAC TROMETHAMINE 30 MG/ML IJ SOLN
INTRAMUSCULAR | Status: AC
  Filled 2017-08-27: qty 1

## 2017-08-27 MED ORDER — ACETAMINOPHEN 325 MG PO TABS
325.0000 mg | ORAL_TABLET | Freq: Four times a day (QID) | ORAL | Status: DC | PRN
  Administered 2017-08-28 – 2017-08-29 (×3): 650 mg via ORAL
  Filled 2017-08-27 (×3): qty 2

## 2017-08-27 MED ORDER — LIDOCAINE HCL (CARDIAC) 20 MG/ML IV SOLN
INTRAVENOUS | Status: AC
  Filled 2017-08-27: qty 5

## 2017-08-27 MED ORDER — KETOROLAC TROMETHAMINE 15 MG/ML IJ SOLN
INTRAMUSCULAR | Status: AC
  Filled 2017-08-27: qty 1

## 2017-08-27 MED ORDER — FENTANYL CITRATE (PF) 250 MCG/5ML IJ SOLN
INTRAMUSCULAR | Status: AC
  Filled 2017-08-27: qty 5

## 2017-08-27 MED ORDER — INSULIN ASPART 100 UNIT/ML ~~LOC~~ SOLN
8.0000 [IU] | Freq: Once | SUBCUTANEOUS | Status: AC
  Administered 2017-08-28: 8 [IU] via SUBCUTANEOUS

## 2017-08-27 MED ORDER — ONDANSETRON HCL 4 MG/2ML IJ SOLN
INTRAMUSCULAR | Status: DC | PRN
  Administered 2017-08-27: 4 mg via INTRAVENOUS

## 2017-08-27 MED ORDER — HYDROMORPHONE HCL 1 MG/ML IJ SOLN
INTRAMUSCULAR | Status: AC
  Administered 2017-08-27: 0.5 mg via INTRAVENOUS
  Filled 2017-08-27: qty 1

## 2017-08-27 MED ORDER — SUCCINYLCHOLINE CHLORIDE 200 MG/10ML IV SOSY
PREFILLED_SYRINGE | INTRAVENOUS | Status: DC | PRN
  Administered 2017-08-27: 120 mg via INTRAVENOUS

## 2017-08-27 MED ORDER — PHENYLEPHRINE 40 MCG/ML (10ML) SYRINGE FOR IV PUSH (FOR BLOOD PRESSURE SUPPORT)
PREFILLED_SYRINGE | INTRAVENOUS | Status: AC
  Filled 2017-08-27: qty 10

## 2017-08-27 MED ORDER — HYDROMORPHONE HCL 1 MG/ML IJ SOLN
0.5000 mg | INTRAMUSCULAR | Status: DC | PRN
  Administered 2017-08-27 – 2017-08-29 (×7): 1 mg via INTRAVENOUS
  Filled 2017-08-27 (×8): qty 1

## 2017-08-27 MED ORDER — METOCLOPRAMIDE HCL 10 MG PO TABS: 5.0000 mg | ORAL_TABLET | Freq: Three times a day (TID) | ORAL | Status: DC | PRN

## 2017-08-27 MED ORDER — DEXAMETHASONE SODIUM PHOSPHATE 4 MG/ML IJ SOLN
INTRAMUSCULAR | Status: DC | PRN
  Administered 2017-08-27: 4 mg via INTRAVENOUS

## 2017-08-27 MED ORDER — LACTATED RINGERS IV SOLN
INTRAVENOUS | Status: DC
  Administered 2017-08-27: 11:00:00 via INTRAVENOUS

## 2017-08-27 MED ORDER — MIDAZOLAM HCL 2 MG/2ML IJ SOLN
INTRAMUSCULAR | Status: AC
  Filled 2017-08-27: qty 2

## 2017-08-27 MED ORDER — SUCCINYLCHOLINE CHLORIDE 200 MG/10ML IV SOSY
PREFILLED_SYRINGE | INTRAVENOUS | Status: AC
  Filled 2017-08-27: qty 10

## 2017-08-27 MED ORDER — PROPOFOL 10 MG/ML IV BOLUS
INTRAVENOUS | Status: AC
  Filled 2017-08-27: qty 20

## 2017-08-27 MED ORDER — METHOCARBAMOL 500 MG PO TABS
500.0000 mg | ORAL_TABLET | Freq: Four times a day (QID) | ORAL | Status: DC | PRN
  Administered 2017-08-29: 500 mg via ORAL
  Filled 2017-08-27: qty 1

## 2017-08-27 MED ORDER — METOCLOPRAMIDE HCL 5 MG/ML IJ SOLN: 5.0000 mg | Freq: Three times a day (TID) | INTRAMUSCULAR | Status: DC | PRN

## 2017-08-27 MED ORDER — INSULIN ASPART 100 UNIT/ML ~~LOC~~ SOLN
0.0000 [IU] | Freq: Three times a day (TID) | SUBCUTANEOUS | Status: DC
  Administered 2017-08-27: 5 [IU] via SUBCUTANEOUS
  Administered 2017-08-28: 15 [IU] via SUBCUTANEOUS
  Administered 2017-08-28: 5 [IU] via SUBCUTANEOUS
  Administered 2017-08-29: 3 [IU] via SUBCUTANEOUS
  Administered 2017-08-29: 5 [IU] via SUBCUTANEOUS
  Administered 2017-08-30: 2 [IU] via SUBCUTANEOUS
  Administered 2017-08-30 – 2017-09-01 (×3): 3 [IU] via SUBCUTANEOUS
  Administered 2017-09-02: 2 [IU] via SUBCUTANEOUS
  Administered 2017-09-03: 3 [IU] via SUBCUTANEOUS
  Administered 2017-09-03: 2 [IU] via SUBCUTANEOUS

## 2017-08-27 MED ORDER — CEFAZOLIN SODIUM-DEXTROSE 2-4 GM/100ML-% IV SOLN
2.0000 g | INTRAVENOUS | Status: AC
  Administered 2017-08-27: 2 g via INTRAVENOUS

## 2017-08-27 MED ORDER — HYDROMORPHONE HCL 1 MG/ML IJ SOLN
INTRAMUSCULAR | Status: AC
  Filled 2017-08-27: qty 1

## 2017-08-27 MED ORDER — PROPOFOL 10 MG/ML IV BOLUS
INTRAVENOUS | Status: DC | PRN
  Administered 2017-08-27: 200 mg via INTRAVENOUS

## 2017-08-27 MED ORDER — PROMETHAZINE HCL 25 MG/ML IJ SOLN: 6.2500 mg | INTRAMUSCULAR | Status: DC | PRN

## 2017-08-27 SURGICAL SUPPLY — 35 items
BLADE SAW RECIP 87.9 MT (BLADE) ×3 IMPLANT
BLADE SURG 21 STRL SS (BLADE) ×3 IMPLANT
BNDG COHESIVE 6X5 TAN STRL LF (GAUZE/BANDAGES/DRESSINGS) ×6 IMPLANT
BNDG GAUZE ELAST 4 BULKY (GAUZE/BANDAGES/DRESSINGS) ×6 IMPLANT
CANISTER SUCTION 1500CC (MISCELLANEOUS) ×2 IMPLANT
COVER SURGICAL LIGHT HANDLE (MISCELLANEOUS) ×3 IMPLANT
CUFF TOURNIQUET SINGLE 34IN LL (TOURNIQUET CUFF) IMPLANT
CUFF TOURNIQUET SINGLE 44IN (TOURNIQUET CUFF) IMPLANT
DRAPE INCISE IOBAN 66X45 STRL (DRAPES) IMPLANT
DRAPE U-SHAPE 47X51 STRL (DRAPES) ×3 IMPLANT
DRESSING PREVENA PLUS CUSTOM (GAUZE/BANDAGES/DRESSINGS) ×1 IMPLANT
DRSG PREVENA PLUS CUSTOM (GAUZE/BANDAGES/DRESSINGS) ×6
DRSG VAC ATS MED SENSATRAC (GAUZE/BANDAGES/DRESSINGS) ×2 IMPLANT
ELECT REM PT RETURN 9FT ADLT (ELECTROSURGICAL) ×3
ELECTRODE REM PT RTRN 9FT ADLT (ELECTROSURGICAL) ×1 IMPLANT
GLOVE BIOGEL PI IND STRL 9 (GLOVE) ×1 IMPLANT
GLOVE BIOGEL PI INDICATOR 9 (GLOVE) ×2
GLOVE SURG ORTHO 9.0 STRL STRW (GLOVE) ×3 IMPLANT
GOWN STRL REUS W/ TWL XL LVL3 (GOWN DISPOSABLE) ×2 IMPLANT
GOWN STRL REUS W/TWL XL LVL3 (GOWN DISPOSABLE) ×4
KIT BASIN OR (CUSTOM PROCEDURE TRAY) ×3 IMPLANT
KIT DRSG PREVENA PLUS 7DAY 125 (MISCELLANEOUS) ×2 IMPLANT
KIT TURNOVER KIT B (KITS) ×3 IMPLANT
MANIFOLD NEPTUNE II (INSTRUMENTS) ×3 IMPLANT
NS IRRIG 1000ML POUR BTL (IV SOLUTION) ×3 IMPLANT
PACK ORTHO EXTREMITY (CUSTOM PROCEDURE TRAY) ×3 IMPLANT
PAD ARMBOARD 7.5X6 YLW CONV (MISCELLANEOUS) ×3 IMPLANT
SPONGE LAP 18X18 X RAY DECT (DISPOSABLE) IMPLANT
STAPLER VISISTAT 35W (STAPLE) IMPLANT
STOCKINETTE IMPERVIOUS LG (DRAPES) ×3 IMPLANT
SUT SILK 2 0 (SUTURE) ×2
SUT SILK 2-0 18XBRD TIE 12 (SUTURE) ×1 IMPLANT
SUT VIC AB 1 CTX 27 (SUTURE) IMPLANT
TOWEL OR 17X26 10 PK STRL BLUE (TOWEL DISPOSABLE) ×3 IMPLANT
YANKAUER SUCT BULB TIP NO VENT (SUCTIONS) ×2 IMPLANT

## 2017-08-27 NOTE — Progress Notes (Signed)
Inpatient Diabetes Program Recommendations  AACE/ADA: New Consensus Statement on Inpatient Glycemic Control (2015)  Target Ranges:  Prepandial:   less than 140 mg/dL      Peak postprandial:   less than 180 mg/dL (1-2 hours)      Critically ill patients:  140 - 180 mg/dL   Lab Results  Component Value Date   GLUCAP 167 (H) 08/27/2017   HGBA1C 11.1 (H) 08/25/2017    Review of Glycemic Control Results for Christopher Callahan, Dolan (MRN [id number]) as of 08/27/2017 09:55  Ref. Range 08/26/2017 20:19 08/27/2017 00:22 08/27/2017 06:03 08/27/2017 08:03  Glucose-Capillary Latest Ref Range: 65 - 99 mg/dL 409306 (H) 811192 (H) 914207 (H) 167 (H)   Diabetes history:Type 1DM Outpatient Diabetes medications:Levemir 24 units BID, Novolog 5 units TID Current orders for Inpatient glycemic control:Novolog 0-15 units Q4H, Lantus 10 units BId, Novolog 3 units TID  Inpatient Diabetes Program Recommendations:  Noted patient in surgery, when eating resumes, consider increasing Novolog to 7 units TID (assuming patient consumes >50%, placed under Glycemic control order set). Also, add Novolog 0-5 units QHS and change correction Nov[facility] to [facility].   Thanks, Lujean RaveLauren Kirsten Mckone, MSN, RNC-OB Diabetes Coordinator [phone] (8a-5p)

## 2017-08-27 NOTE — Anesthesia Preprocedure Evaluation (Signed)
Anesthesia Evaluation  Patient identified by MRN, date of birth, ID band Patient awake    Reviewed: Allergy & Precautions, NPO status , Patient's Chart, lab work & pertinent test results  Airway Mallampati: I       Dental no notable dental hx. (+) Teeth Intact   Pulmonary Current Smoker,    Pulmonary exam normal breath sounds clear to auscultation       Cardiovascular  Rhythm:Regular Rate:Tachycardia     Neuro/Psych  Neuromuscular disease negative psych ROS   GI/Hepatic Neg liver ROS, Gastroparesis   Endo/Other  diabetes, Insulin Dependent  Renal/GU negative Renal ROS  negative genitourinary   Musculoskeletal negative musculoskeletal ROS (+)   Abdominal (+) + obese,   Peds  Hematology  (+) Blood dyscrasia, anemia ,   Anesthesia Other Findings   Reproductive/Obstetrics                             Anesthesia Physical Anesthesia Plan  ASA: II  Anesthesia Plan: General   Post-op Pain Management:    Induction: Intravenous  PONV Risk Score and Plan: 2 and Ondansetron, Dexamethasone and Midazolam  Airway Management Planned: Oral ETT  Additional Equipment:   Intra-op Plan:   Post-operative Plan: Extubation in OR  Informed Consent: I have reviewed the patients History and Physical, chart, labs and discussed the procedure including the risks, benefits and alternatives for the proposed anesthesia with the patient or authorized representative who has indicated his/her understanding and acceptance.   Dental advisory given  Plan Discussed with: CRNA and Surgeon  Anesthesia Plan Comments:         Anesthesia Quick Evaluation

## 2017-08-27 NOTE — Anesthesia Postprocedure Evaluation (Signed)
Anesthesia Post Note  [name]  Procedure(s) Performed: RIGHT BELOW KNEE AMPUTATION (Right )     Patient location during evaluation: PACU Anesthesia Type: General Level of consciousness: awake Pain management: pain level controlled Vital Signs Assessment: post-procedure vital signs reviewed and stable Respiratory status: spontaneous breathing Cardiovascular status: stable Postop Assessment: no apparent nausea or vomiting Anesthetic complications: no    Last Vitals:  Vitals:   08/27/17 1415 08/27/17 1424  BP:  132/86  Pulse: (!) 116 (!) 110  Resp: 13 15  Temp:    SpO2: 95% 91%    Last Pain:  Vitals:   08/27/17 1424  TempSrc:   PainSc: 6    Pain Goal: Patients Stated Pain Goal: 4 (08/27/17 0825)               Hannan Hutmacher JR,[name] Susann GivensFRANKLIN

## 2017-08-27 NOTE — Transfer of Care (Signed)
Immediate Anesthesia Transfer of Care Note  [name]  Procedure(s) Performed: RIGHT BELOW KNEE AMPUTATION (Right )  Patient Location: PACU  Anesthesia Type:General  Level of Consciousness: sedated and drowsy  Airway & Oxygen Therapy: Patient Spontanous Breathing and Patient connected to nasal cannula oxygen  Post-op Assessment: Report given to RN and Post -op Vital signs reviewed and stable  Post vital signs: Reviewed and stable  Last Vitals:  Vitals Value Taken Time  BP 152/83 08/27/2017  1:38 PM  Temp    Pulse 127 08/27/2017  1:41 PM  Resp 18 08/27/2017  1:41 PM  SpO2 92 % 08/27/2017  1:41 PM  Vitals shown include unvalidated device data.  Last Pain:  Vitals:   08/27/17 1012  TempSrc: Oral  PainSc:       Patients Stated Pain Goal: 4 (08/27/17 0825)  Complications: No apparent anesthesia complications

## 2017-08-27 NOTE — Progress Notes (Addendum)
Patient ID: Christopher Callahan, male   DOB: 05-03-1991, 27 y.o.   MRN: [id number]  PROGRESS NOTE    Christopher Callahan  ZHY:[id number] DOB: Apr 13, 1991 DOA: 08/24/2017 PCP: Jacquelynn Cree, PA-C   Brief Narrative: 27 year old male with history of diabetes mellitus type 1 complicated by diabetic neuropathy of bilateral feet, diabetic foot wound, questionable gastroparesis and ongoing tobacco use presented with worsening right foot wound.  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  He was started on broad-spectrum antibiotics and orthopedics was consulted.   Assessment & Plan:   Active Problems:   Severe sepsis (HCC)   Diabetic foot infection (HCC)   Sepsis (HCC)  Severe sepsis probably due to right foot osteomyelitis -Antibiotic plan asbelow.  Still spiking temperatures and intermittently tachycardic.  Cultures negative so far.  Repeat a.m. Labs  Right foot acute osteomyelitis -Orthopedics following: Probable plan for amputation today -Continue vancomycin and Zosyn.  Monitor renal function, renal function is stable for now. -Cultures negative so far  Leukocytosis -Probably secondary to above; worse today.  Repeat a.m. Labs  Lactic acidosis -Secondary to above.  Resolved  Diabetes mellitus type 1 uncontrolled with hyperglycemia -Hemoglobin A1c is 11.1.  Continue Lantus and NovoLog.  Continue Accu-Cheks with insulin sliding scale coverage  Hyponatremia -Probably secondary to hyperglycemia and dehydration -Improving.  Repeat a.m. labs  Ongoing tobacco use -Patient was counseled regarding cessation  Obesity -Outpatient follow-up  Hypokalemia improved.  Repeat a.m. labs  DVT prophylaxis:  Lovenox Code Status: Full Family Communication: None at bedside Disposition Plan: Depends on clinical outcome  Consultants: Orthopedics  Procedures: None  Antimicrobials: Vancomycin and Zosyn from 08/24/2017 onwards   Subjective: Examined at  bedside.  Still complains of right foot pain which gets better with pain meds.  No overnight vomiting.  Still having fever.  Objective: Vitals:   08/26/17 2314 08/27/17 0314 08/27/17 0515 08/27/17 0806  BP: (!) 160/74 (!) 158/104 (!) 142/92   Pulse:      Resp: 20 17 16    Temp:  (!) 100.5 F (38.1 C) 100.3 F (37.9 C) (!) 101 F (38.3 C)  TempSrc:    Oral  SpO2:      Weight:      Height:        Intake/Output Summary (Last 24 hours) at 08/27/2017 1027 Last data filed at 08/27/2017 0835 Gross per 24 hour  Intake 1283 ml  Output 2500 ml  Net -1217 ml   Filed Weights   08/24/17 1606  Weight: 106.6 kg (235 lb)    Examination:  General exam: No acute distress.   Respiratory system: Bilateral  decreased breath sounds at bases Cardiovascular system: S1 & S2 heard, intermittently tachycardic gastrointestinal system: Abdomen is nondistended, soft and nontender. Normal bowel sounds heard. Central nervous system: Alert and oriented. No focal neurological deficits. Moving extremities Extremities: No cyanosis, clubbing, edema  Skin: Right foot dressing present  psychiatry: Flat affect  Data Reviewed: I have personally reviewed following labs and imaging studies  CBC: Recent Labs  Lab 08/24/17 1615 08/25/17 0632 08/26/17 0542 08/27/17 0604  WBC 25.9* 20.8* 19.5* 21.1*  NEUTROABS 23.3*  --  15.7* 17.3*  HGB 13.1 10.8* 10.3* 11.2*  HCT 38.7* 31.1* 30.0* 32.4*  MCV 83.9 79.9 80.2 81.2  PLT 322 290 275 319   Basic Metabolic Panel: Recent Labs  Lab 08/24/17 1615 08/24/17 1933 08/25/17 0632 08/26/17 0542 08/27/17 0604  NA 121* 127* 131* 132* 133*  K 4.2 3.6 3.2* 3.3*  3.9  CL 87* 95* 99* 100* 98*  CO2 21* 20* 22 22 24   GLUCOSE 856* 477* 255* 211* 190*  BUN 16 13 11 12 11   CREATININE 1.18 0.86 0.98 1.02 1.01  CALCIUM 7.9* 7.3* 7.4* 7.4* 8.0*  MG  --   --   --  1.8 2.0   GFR: Estimated Creatinine Clearance: 143.3 mL/min (by C-G formula based on SCr of 1.01  mg/dL). Liver Function Tests: Recent Labs  Lab 08/24/17 1615 08/25/17 0632  AST 19 18  ALT 16* 17  ALKPHOS 198* 143*  BILITOT 0.8 0.6  PROT 7.1 6.0*  ALBUMIN 2.3* 1.9*   No results for input(s): LIPASE, AMYLASE in the last 168 hours. No results for input(s): AMMONIA in the last 168 hours. Coagulation Profile: Recent Labs  Lab 08/25/17 0632  INR 1.49   Cardiac Enzymes: No results for input(s): CKTOTAL, CKMB, CKMBINDEX, TROPONINI in the last 168 hours. BNP (last 3 results) No results for input(s): PROBNP in the last 8760 hours. HbA1C: Recent Labs    08/25/17 0632  HGBA1C 11.1*   CBG: Recent Labs  Lab 08/26/17 1608 08/26/17 2019 08/27/17 0022 08/27/17 0603 08/27/17 0803  GLUCAP 229* 306* 192* 207* 167*   Lipid Profile: No results for input(s): CHOL, HDL, LDLCALC, TRIG, CHOLHDL, LDLDIRECT in the last 72 hours. Thyroid Function Tests: No results for input(s): TSH, T4TOTAL, FREET4, T3FREE, THYROIDAB in the last 72 hours. Anemia Panel: No results for input(s): VITAMINB12, FOLATE, FERRITIN, TIBC, IRON, RETICCTPCT in the last 72 hours. Sepsis Labs: Recent Labs  Lab 08/24/17 1627 08/24/17 1838 08/24/17 2151  LATICACIDVEN 3.50* 1.95* 1.9    Recent Results (from the past 240 hour(s))  Blood culture (routine x 2)     Status: None (Preliminary result)   Collection Time: 08/24/17  4:15 PM  Result Value Ref Range Status   Specimen Description BLOOD LEFT ANTECUBITAL  Final   Special Requests AEROBIC BOTTLE ONLY Blood Culture adequate volume  Final   Culture   Final    NO GROWTH 2 DAYS Performed at [facility], [address] [place], [place] [address]    Report Status PENDING  Incomplete  Blood culture (routine x 2)     Status: None (Preliminary result)   Collection Time: 08/24/17  5:19 PM  Result Value Ref Range Status   Specimen Description BLOOD RIGHT ANTECUBITAL  Final   Special Requests   Final    BOTTLES DRAWN AEROBIC AND ANAEROBIC Blood Culture  adequate volume   Culture   Final    NO GROWTH 2 DAYS Performed at [facility], [address] [place], [place] [address]    Report Status PENDING  Incomplete  Urine culture     Status: None   Collection Time: 08/24/17  6:50 PM  Result Value Ref Range Status   Specimen Description URINE, RANDOM  Final   Special Requests NONE  Final   Culture   Final    NO GROWTH Performed at [facility], [address] [place], [place] [address]    Report Status 08/25/2017 FINAL  Final  MRSA PCR Screening     Status: Abnormal   Collection Time: 08/24/17  9:26 PM  Result Value Ref Range Status   MRSA by PCR POSITIVE (A) NEGATIVE Final    Comment:        The GeneXpert MRSA Assay (FDA approved for NASAL specimens only), is one component of a comprehensive MRSA colonization surveillance program. It  is not intended to diagnose MRSA infection nor to guide or monitor treatment for MRSA infections. RESULT CALLED TO, READ BACK BY AND VERIFIED WITH: ROMESBERG,K RN 0125 08/25/17 MITCHELL,L Performed at [facility], [address], [place], [place] [address]          Radiology Studies: No results found.      Scheduled Meds: . Chlorhexidine Gluconate Cloth  6 each Topical Q0600  . enoxaparin (LOVENOX) injection  40 mg Subcutaneous Q24H  . insulin aspart  0-15 Units Subcutaneous TID WC  . insulin aspart  0-5 Units Subcutaneous QHS  . insulin aspart  7 Units Subcutaneous TID WC  . insulin glargine  10 Units Subcutaneous BID  . ketorolac  30 mg Intravenous Once  . mupirocin ointment  1 application Nasal BID  . sodium chloride flush  3 mL Intravenous Q12H   Continuous Infusions: . piperacillin-tazobactam (ZOSYN)  IV Stopped (08/27/17 0947)  . sodium chloride    . vancomycin Stopped (08/27/17 3474)     LOS: 3 days        Glade Lloyd, MD [facility] Pager [phone]  If 7PM-7AM, please contact night-coverage [email or web address] Password  [facility] 08/27/2017, 10:27 AM

## 2017-08-27 NOTE — Op Note (Signed)
   Date of Surgery: 08/27/2017  INDICATIONS: Mr. Christopher Callahan is a 27 y.o.-year-old male who presents with abscess osteomyelitis ulceration purulent drainage right foot.  Patient has undergone limb salvage intervention in the infection extended proximal to the ankle and patient presents at this time for transtibial amputation.Marland Kitchen.  PREOPERATIVE DIAGNOSIS: Abscess osteomyelitis ulceration right foot  POSTOPERATIVE DIAGNOSIS: Same.  PROCEDURE: Transtibial amputation Application of Prevena wound VAC  SURGEON: Lajoyce Cornersuda, M.D.  ANESTHESIA:  general  IV FLUIDS AND URINE: See anesthesia.  ESTIMATED BLOOD LOSS: Minimal mL.  COMPLICATIONS: None.  DESCRIPTION OF PROCEDURE: The patient was brought to the operating room and underwent a general anesthetic. After adequate levels of anesthesia were obtained patient's lower extremity was prepped using DuraPrep draped into a sterile field. A timeout was called. The foot was draped out of the sterile field with impervious stockinette. A transverse incision was made 11 cm distal to the tibial tubercle. This curved proximally and a large posterior flap was created. The tibia was transected 1 cm proximal to the skin incision. The fibula was transected just proximal to the tibial incision. The tibia was beveled anteriorly. A large posterior flap was created. The sciatic nerve was pulled cut and allowed to retract. The vascular bundles were suture ligated with 2-0 silk. The deep and superficial fascial layers were closed using #1 Vicryl. The skin was closed using staples and 2-0 nylon. The wound was covered with a Prevena wound VAC. There was a good suction fit. A prosthetic shrinker was applied. Patient was extubated taken to the PACU in stable condition.   DISCHARGE PLANNING:  Antibiotic duration: 24 hours postoperatively  Weightbearing: Nonweightbearing on the right orders written for a stump shrinker and a stump protector  Pain medication: As needed  Dressing care/  Wound VAC: Discharge with the Praveena portable wound VAC pump.  Discharge to: Home.  Follow-up: In the office 1 week post operative.  Christopher BakerMarcus Alexsander Cavins, MD [facility] 1:36 PM

## 2017-08-27 NOTE — Interval H&P Note (Signed)
History and Physical Interval Note:  08/27/2017 7:05 AM  Christopher Callahan  has presented today for surgery, with the diagnosis of Abscess, Osteomyelitis Right Foot  The various methods of treatment have been discussed with the patient and family. After consideration of risks, benefits and other options for treatment, the patient has consented to  Procedure(s): RIGHT BELOW KNEE AMPUTATION (Right) as a surgical intervention .  The patient's history has been reviewed, patient examined, no change in status, stable for surgery.  I have reviewed the patient's chart and labs.  Questions were answered to the patient's satisfaction.     Nadara MustardMarcus V Keylon Labelle

## 2017-08-27 NOTE — Anesthesia Procedure Notes (Signed)
Procedure Name: Intubation Date/Time: 08/27/2017 12:56 PM Performed by: Julian ReilWelty, Jaila Schellhorn F, CRNA Pre-anesthesia Checklist: Patient identified, Emergency Drugs available, Suction available, Patient being monitored and Timeout performed Patient Re-evaluated:Patient Re-evaluated prior to induction Oxygen Delivery Method: Circle system utilized Preoxygenation: Pre-oxygenation with 100% oxygen Induction Type: IV induction, Cricoid Pressure applied and Rapid sequence Laryngoscope Size: Miller and 3 Grade View: Grade I Tube type: Oral Tube size: 7.5 mm Number of attempts: 1 Airway Equipment and Method: Stylet Placement Confirmation: positive ETCO2,  breath sounds checked- equal and bilateral and ETT inserted through vocal cords under direct vision Secured at: 23 cm Tube secured with: Tape Dental Injury: Teeth and Oropharynx as per pre-operative assessment  Comments: 4x4s bite block used.

## 2017-08-28 ENCOUNTER — Encounter (HOSPITAL_COMMUNITY): Payer: Self-pay | Admitting: Orthopedic Surgery

## 2017-08-28 LAB — GLUCOSE, CAPILLARY
GLUCOSE-CAPILLARY: 447 mg/dL — AB (ref 65–99)
GLUCOSE-CAPILLARY: 455 mg/dL — AB (ref 65–99)
GLUCOSE-CAPILLARY: 430 mg/dL — AB (ref 65–99)
Glucose-Capillary: 433 mg/dL — ABNORMAL HIGH (ref 65–99)
Glucose-Capillary: 530 mg/dL (ref 65–99)
Glucose-Capillary: 248 mg/dL — ABNORMAL HIGH (ref 65–99)
Glucose-Capillary: 290 mg/dL — ABNORMAL HIGH (ref 65–99)

## 2017-08-28 LAB — BASIC METABOLIC PANEL
ANION GAP: 10 (ref 5–15)
BUN: 20 mg/dL (ref 6–20)
CALCIUM: 7.5 mg/dL — AB (ref 8.9–10.3)
CO2: 22 mmol/L (ref 22–32)
Chloride: 97 mmol/L — ABNORMAL LOW (ref 101–111)
Creatinine, Ser: 1.23 mg/dL (ref 0.61–1.24)
Glucose, Bld: 490 mg/dL — ABNORMAL HIGH (ref 65–99)
Potassium: 4.5 mmol/L (ref 3.5–5.1)
Sodium: 129 mmol/L — ABNORMAL LOW (ref 135–145)

## 2017-08-28 LAB — CBC WITH DIFFERENTIAL/PLATELET
BASOS ABS: 0 10*3/uL (ref 0.0–0.1)
Basophils Relative: 0 %
Eosinophils Absolute: 0 10*3/uL (ref 0.0–0.7)
Eosinophils Relative: 0 %
HEMATOCRIT: 33.2 % — AB (ref 39.0–52.0)
Hemoglobin: 11.2 g/dL — ABNORMAL LOW (ref 13.0–17.0)
LYMPHS PCT: 9 %
Lymphs Abs: 1.1 10*3/uL (ref 0.7–4.0)
MCH: 27.9 pg (ref 26.0–34.0)
MCHC: 33.7 g/dL (ref 30.0–36.0)
MCV: 82.6 fL (ref 78.0–100.0)
MONO ABS: 0.6 10*3/uL (ref 0.1–1.0)
Monocytes Relative: 5 %
NEUTROS ABS: 11.4 10*3/uL — AB (ref 1.7–7.7)
NEUTROS PCT: 86 %
Platelets: 362 10*3/uL (ref 150–400)
RBC: 4.02 MIL/uL — AB (ref 4.22–5.81)
RDW: 13.4 % (ref 11.5–15.5)
WBC: 13.2 10*3/uL — AB (ref 4.0–10.5)

## 2017-08-28 LAB — MAGNESIUM: Magnesium: 2.2 mg/dL (ref 1.7–2.4)

## 2017-08-28 MED ORDER — INSULIN ASPART 100 UNIT/ML ~~LOC~~ SOLN
15.0000 [IU] | Freq: Once | SUBCUTANEOUS | Status: AC
  Administered 2017-08-28: 15 [IU] via SUBCUTANEOUS

## 2017-08-28 MED ORDER — ZOLPIDEM TARTRATE 5 MG PO TABS
5.0000 mg | ORAL_TABLET | Freq: Every evening | ORAL | Status: DC | PRN
  Administered 2017-08-28 – 2017-09-02 (×5): 5 mg via ORAL
  Filled 2017-08-28 (×5): qty 1

## 2017-08-28 MED ORDER — INSULIN ASPART 100 UNIT/ML ~~LOC~~ SOLN
18.0000 [IU] | Freq: Once | SUBCUTANEOUS | Status: AC
  Administered 2017-08-28: 18 [IU] via SUBCUTANEOUS

## 2017-08-28 MED ORDER — INSULIN ASPART 100 UNIT/ML ~~LOC~~ SOLN
10.0000 [IU] | Freq: Three times a day (TID) | SUBCUTANEOUS | Status: DC
Start: 2017-08-28 — End: 2017-09-03
  Administered 2017-08-28 – 2017-09-03 (×19): 10 [IU] via SUBCUTANEOUS

## 2017-08-28 MED ORDER — INSULIN GLARGINE 100 UNIT/ML ~~LOC~~ SOLN
20.0000 [IU] | Freq: Two times a day (BID) | SUBCUTANEOUS | Status: DC
  Administered 2017-08-28 (×2): 20 [IU] via SUBCUTANEOUS
  Filled 2017-08-28 (×3): qty 0.2

## 2017-08-28 NOTE — Progress Notes (Signed)
Patient CBG was 530 MD notified and orders given 18 units of regular insulin. Will continue to monitor.

## 2017-08-28 NOTE — Progress Notes (Signed)
Patient ID: Christopher Callahan, male   DOB: [birth year], 27 y.o.   MRN: [id number] Postoperative day 1 transtibial amputation.  There is approximately 25 cc [facility].  Orders are written for a stump shrinker and a stump protector.  Anticipate patient can discharge to home with the portable Praveena wound VAC pump once he is safe with therapy.

## 2017-08-28 NOTE — Progress Notes (Signed)
Results for Trevor MaceKEELING, [place] (MRN [id number]) as of 08/28/2017 12:11  Ref. Range 08/28/2017 01:16 08/28/2017 02:48 08/28/2017 03:45 08/28/2017 07:40 08/28/2017 11:56  Glucose-Capillary Latest Ref Range: 65 - 99 mg/dL 409447 (H) 811455 (H) 914433 (H) 530 (HH) 430 (H)  Noted that blood sugars were greater than 400 mg/dl last night and then 782530 mg/dl this am. New orders written for Lantus 20 units BID, Novolog MODERATE TID & HS, and Novolog 10 U TID if patient eats at least 50% of meals.  Will continue to monitor blood sugars while in the hospital and will watch CBG trends with new insulin orders.  If patient continues to have blood sugars greater than 350 mg/dl, recommend starting IV insulin and glucostabilizer.   Smith MinceKendra Ashwika Freels RN BSN CDE Diabetes Coordinator Pager: [phone]  8am-5pm

## 2017-08-28 NOTE — Evaluation (Signed)
Physical Therapy Evaluation Patient Details Name: Christopher Callahan MRN: [id number] DOB: February 17, 1991 Today's Date: 08/28/2017   History of Present Illness  27 yo male with onset of osteomyelitis, abscess, low K+, leukocytosis, and failed ABT was admitted to receive BK amputation.  Clinical Impression a Pt was able to show ability to transfer to the chair, and was comfortable with no extrasupport to the R leg.  He is expecting to work on healing and transition to [facility] as he is awaiting healing for a prosthetics.  Follow acutely for strength and balance as ordered.      Follow Up Recommendations CIR    Equipment Recommendations  Rolling walker with 5" wheels    Recommendations for Other Services       Precautions / Restrictions Precautions Precautions: Fall Restrictions Weight Bearing Restrictions: No      Mobility  Bed Mobility Overal bed mobility: Independent                Transfers Overall transfer level: Needs assistance Equipment used: Rolling walker (2 wheeled);1 person hand held assist Transfers: Sit to/from Stand;Stand Pivot Transfers Sit to Stand: Min assist;Mod assist Stand pivot transfers: Min assist;Mod assist       General transfer comment: reminders for hand placement  Ambulation/Gait Ambulation/Gait assistance: Min assist Ambulation Distance (Feet): 6 Feet Assistive device: Rolling walker (2 wheeled);1 person hand held assist Gait Pattern/deviations: Step-through pattern Gait velocity: reduced Gait velocity interpretation: Below normal speed for age/gender General Gait Details: hops to L on RW for transition to chair.  Stairs            Wheelchair Mobility    Modified Rankin (Stroke Patients Only)       Balance Overall balance assessment: Needs assistance Sitting-balance support: Feet supported Sitting balance-Leahy Scale: Good     Standing balance support: Bilateral upper extremity supported Standing  balance-Leahy Scale: Poor                               Pertinent Vitals/Pain Pain Assessment: 0-10 Pain Score: 9  Pain Location: R stump Pain Descriptors / Indicators: Operative site guarding Pain Intervention(s): Limited activity within patient's tolerance;Monitored during session;Repositioned;Patient requesting pain meds-RN notified    Home Living Family/patient expects to be discharged to:: Inpatient rehab                      Prior Function Level of Independence: Independent         Comments: lives with his mother     Hand Dominance   Dominant Hand: Right    Extremity/Trunk Assessment   Upper Extremity Assessment Upper Extremity Assessment: Overall WFL for tasks assessed    Lower Extremity Assessment Lower Extremity Assessment: RLE deficits/detail RLE Deficits / Details: new R BK amputation RLE Coordination: decreased fine motor;decreased gross motor    Cervical / Trunk Assessment Cervical / Trunk Assessment: Normal  Communication   Communication: No difficulties  Cognition Arousal/Alertness: Awake/alert Behavior During Therapy: WFL for tasks assessed/performed Overall Cognitive Status: Within Functional Limits for tasks assessed                                        General Comments      Exercises General Exercises - Lower Extremity Ankle Circles/Pumps: AROM;Both;5 reps Quad Sets: AROM;Both;10 reps Hip ABduction/ADduction: AROM;Both;10 reps  Assessment/Plan    PT Assessment Patient needs continued PT services  PT Problem List Decreased strength;Decreased range of motion;Decreased balance;Decreased activity tolerance;Decreased mobility;Decreased coordination;Decreased cognition;Decreased knowledge of use of DME;Decreased safety awareness;Cardiopulmonary status limiting activity;Obesity;Decreased skin integrity;Pain       PT Treatment Interventions DME instruction;Gait training;Stair training;Functional  mobility training;Therapeutic exercise;Therapeutic activities;Balance training;Neuromuscular re-education;Patient/family education    PT Goals (Current goals can be found in the Care Plan section)  Acute Rehab PT Goals Patient Stated Goal: to get a prosthesis PT Goal Formulation: With patient/family Time For Goal Achievement: 08/28/17 Potential to Achieve Goals: Good    Frequency     Barriers to discharge Decreased caregiver support pt is home with family but with many stairs to transtion in house    Co-evaluation               AM-PAC PT "6 Clicks" Daily Activity  Outcome Measure Difficulty turning over in bed (including adjusting bedclothes, sheets and blankets)?: None Difficulty moving from lying on back to sitting on the side of the bed? : None Difficulty sitting down on and standing up from a chair with arms (e.g., wheelchair, bedside commode, etc,.)?: None Help needed moving to and from a bed to chair (including a wheelchair)?: A Lot Help needed walking in hospital room?: A Lot Help needed climbing 3-5 steps with a railing? : A Lot 6 Click Score: 18    End of Session Equipment Utilized During Treatment: Gait belt Activity Tolerance: Patient tolerated treatment well Patient left: in chair;with call bell/phone within reach;with chair alarm set;with family/visitor present Nurse Communication: Mobility status PT Visit Diagnosis: Unsteadiness on feet (R26.81);Muscle weakness (generalized) (M62.81);Difficulty in walking, not elsewhere classified (R26.2);Pain Pain - Right/Left: Right Pain - part of body: Leg    Time: 1450-1519 PT Time Calculation (min) (ACUTE ONLY): 29 min   Charges:   PT Evaluation $PT Eval Moderate Complexity: 1 Mod PT Treatments $Gait Training: 8-22 mins   PT G Codes:   PT G-Codes **NOT FOR INPATIENT CLASS** Functional Assessment Tool Used: AM-PAC 6 Clicks Basic Mobility    Ivar Drape 08/28/2017, 8:53 PM   Samul Dada, PT MS Acute Rehab  Dept. Number: [facility] [id number] and [facility] [phone]

## 2017-08-28 NOTE — Progress Notes (Signed)
Patient ID: Christopher Callahan, male   DOB: 10/30/1990, 27 y.o.   MRN: [id number]  PROGRESS NOTE    Christopher Callahan  QMV:[id number] DOB: [birth year] DOA: 08/24/2017 PCP: Jacquelynn Cree, PA-C   Brief Narrative: 27 year old male with history of diabetes mellitus type 1 complicated by diabetic neuropathy of bilateral feet, diabetic foot wound, questionable gastroparesis and ongoing tobacco use presented with worsening right foot wound.  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  He was started on broad-spectrum antibiotics and orthopedics was consulted.   Assessment & Plan:   Active Problems:   Severe sepsis (HCC)   Diabetic foot infection (HCC)   Sepsis (HCC)   Subacute osteomyelitis, right ankle and foot (HCC)  Severe sepsis probably due to right foot osteomyelitis -Antibiotic plan as below.  Currently afebrile.  Cultures negative so far.  Repeat a.m. 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  Currently has a wound VAC as per orthopedics.  Wound care and wound VAC care as per orthopedics recommendations. -PT eval -Discontinue Zosyn.  Continue vancomycin for 1 more day and probable switch to oral doxycycline tomorrow. -Monitor renal function, renal function is stable for now. -Cultures negative so far  Leukocytosis -Probably secondary to above; improving.  Repeat a.m. Labs  Lactic acidosis -Secondary to above.  Resolved   Diabetes mellitus type 1 uncontrolled with hyperglycemia -Hemoglobin A1c is 11.1.   -Blood sugars are still very elevated.  Increase Lantus to 20 units subcu twice daily.  Increase NovoLog to 10 units 3 times a day with meals.  Continue Accu-Cheks with coverage.  Hyponatremia -Probably secondary to hyperglycemia and dehydration -Repeat a.m. labs  Ongoing tobacco use -Patient was counseled regarding cessation  Obesity -Outpatient  follow-up  Hypokalemia -Resolved.  Repeat a.m. labs  DVT prophylaxis:  Lovenox Code Status: Full Family Communication: Spoke to mother at bedside Disposition Plan: Home in 1-3 days  Consultants: Orthopedics  Procedures: None  Antimicrobials: Vancomycin and Zosyn from 08/24/2017 onwards   Subjective: Patient seen and examined at bedside.  No overnight fever, nausea or vomiting.  Pain is better controlled. Objective: Vitals:   08/28/17 0110 08/28/17 0430 08/28/17 0520 08/28/17 0814  BP: 134/83  (!) 137/93 (!) 148/101  Pulse: 91  84   Resp: 15  (!) 9   Temp:  (!) 97.4 F (36.3 C)  (!) 97.3 F (36.3 C)  TempSrc:  Oral  Oral  SpO2: 100%  96%   Weight:      Height:        Intake/Output Summary (Last 24 hours) at 08/28/2017 0931 Last data filed at 08/28/2017 0816 Gross per 24 hour  Intake 2426.17 ml  Output 2150 ml  Net 276.17 ml   Filed Weights   08/24/17 1606 08/27/17 1047  Weight: 106.6 kg (235 lb) 106.6 kg (235 lb)    Examination:  General exam: Calm and comfortable.  No distress Respiratory system: Bilateral decreased breath sounds at bases  cardiovascular system: S1 & S2 heard, rate controlled gastrointestinal system: Abdomen is nondistended, soft and nontender. Normal bowel sounds heard. Central nervous system: Alert and oriented. No focal neurological deficits. Moving extremities Extremities: No cyanosis, clubbing skin: right BKA with wound VAC present   psychiatry: Flat affect  Data Reviewed: I have personally reviewed following labs and imaging studies  CBC: Recent Labs  Lab 08/24/17 1615 08/25/17 0632 08/26/17 0542 08/27/17 0604 08/28/17 0420  WBC 25.9* 20.8* 19.5* 21.1* 13.2*  NEUTROABS  23.3*  --  15.7* 17.3* 11.4*  HGB 13.1 10.8* 10.3* 11.2* 11.2*  HCT 38.7* 31.1* 30.0* 32.4* 33.2*  MCV 83.9 79.9 80.2 81.2 82.6  PLT 322 290 275 319 362   Basic Metabolic Panel: Recent Labs  Lab 08/24/17 1933 08/25/17 0632 08/26/17 0542 08/27/17 0604  08/28/17 0420  NA 127* 131* 132* 133* 129*  K 3.6 3.2* 3.3* 3.9 4.5  CL 95* 99* 100* 98* 97*  CO2 20* 22 22 24 22   GLUCOSE 477* 255* 211* 190* 490*  BUN 13 11 12 11 20   CREATININE 0.86 0.98 1.02 1.01 1.23  CALCIUM 7.3* 7.4* 7.4* 8.0* 7.5*  MG  --   --  1.8 2.0 2.2   GFR: Estimated Creatinine Clearance: 117.6 mL/min (by C-G formula based on SCr of 1.23 mg/dL). Liver Function Tests: Recent Labs  Lab 08/24/17 1615 08/25/17 0632  AST 19 18  ALT 16* 17  ALKPHOS 198* 143*  BILITOT 0.8 0.6  PROT 7.1 6.0*  ALBUMIN 2.3* 1.9*   No results for input(s): LIPASE, AMYLASE in the last 168 hours. No results for input(s): AMMONIA in the last 168 hours. Coagulation Profile: Recent Labs  Lab 08/25/17 0632  INR 1.49   Cardiac Enzymes: No results for input(s): CKTOTAL, CKMB, CKMBINDEX, TROPONINI in the last 168 hours. BNP (last 3 results) No results for input(s): PROBNP in the last 8760 hours. HbA1C: No results for input(s): HGBA1C in the last 72 hours. CBG: Recent Labs  Lab 08/27/17 2341 08/28/17 0116 08/28/17 0248 08/28/17 0345 08/28/17 0740  GLUCAP 411* 447* 455* 433* 530*   Lipid Profile: No results for input(s): CHOL, HDL, LDLCALC, TRIG, CHOLHDL, LDLDIRECT in the last 72 hours. Thyroid Function Tests: No results for input(s): TSH, T4TOTAL, FREET4, T3FREE, THYROIDAB in the last 72 hours. Anemia Panel: No results for input(s): VITAMINB12, FOLATE, FERRITIN, TIBC, IRON, RETICCTPCT in the last 72 hours. Sepsis Labs: Recent Labs  Lab 08/24/17 1627 08/24/17 1838 08/24/17 2151  LATICACIDVEN 3.50* 1.95* 1.9    Recent Results (from the past 240 hour(s))  Blood culture (routine x 2)     Status: None (Preliminary result)   Collection Time: 08/24/17  4:15 PM  Result Value Ref Range Status   Specimen Description BLOOD LEFT ANTECUBITAL  Final   Special Requests AEROBIC BOTTLE ONLY Blood Culture adequate volume  Final   Culture   Final    NO GROWTH 3 DAYS Performed at [facility], [address] [place], [place] [address]    Report Status PENDING  Incomplete  Blood culture (routine x 2)     Status: None (Preliminary result)   Collection Time: 08/24/17  5:19 PM  Result Value Ref Range Status   Specimen Description BLOOD RIGHT ANTECUBITAL  Final   Special Requests   Final    BOTTLES DRAWN AEROBIC AND ANAEROBIC Blood Culture adequate volume   Culture   Final    NO GROWTH 3 DAYS Performed at [facility], [address], [place], [place] [address]    Report Status PENDING  Incomplete  Urine culture     Status: None   Collection Time: 08/24/17  6:50 PM  Result Value Ref Range Status   Specimen Description URINE, RANDOM  Final   Special Requests NONE  Final   Culture   Final    NO GROWTH Performed at [facility], [address] [place], [place] [address]    Report Status 08/25/2017 FINAL  Final  MRSA  PCR Screening     Status: Abnormal   Collection Time: 08/24/17  9:26 PM  Result Value Ref Range Status   MRSA by PCR POSITIVE (A) NEGATIVE Final    Comment:        The GeneXpert MRSA Assay (FDA approved for NASAL specimens only), is one component of a comprehensive MRSA colonization surveillance program. It is not intended to diagnose MRSA infection nor to guide or monitor treatment for MRSA infections. RESULT CALLED TO, READ BACK BY AND VERIFIED WITH: ROMESBERG,K RN 0125 08/25/17 MITCHELL,L Performed at [facility], [address] [place], [place] [address]          Radiology Studies: No results found.      Scheduled Meds: . Chlorhexidine Gluconate Cloth  6 each Topical Q0600  . docusate sodium  100 mg Oral BID  . enoxaparin (LOVENOX) injection  40 mg Subcutaneous Q24H  . insulin aspart  0-15 Units Subcutaneous TID WC  . insulin aspart  0-5 Units Subcutaneous QHS  . insulin aspart  10 Units Subcutaneous TID WC  . insulin glargine  20 Units Subcutaneous BID  . ketorolac  30 mg Intravenous Once  .  mupirocin ointment  1 application Nasal BID  . sodium chloride flush  3 mL Intravenous Q12H   Continuous Infusions: . sodium chloride    . lactated ringers 10 mL/hr at 08/27/17 2100  . methocarbamol (ROBAXIN)  IV    . piperacillin-tazobactam (ZOSYN)  IV Stopped (08/28/17 0923)  . sodium chloride    . vancomycin Stopped (08/28/17 0254)     LOS: 4 days        Glade LloydKshitiz Dejanay Wamboldt, MD [facility] Pager [phone]  If 7PM-7AM, please contact night-coverage [email or web address] Password [email or web address] 08/28/2017, 9:31 AM

## 2017-08-28 NOTE — Plan of Care (Addendum)
  Problem: Activity: Goal: Ability to perform//tolerate increased activity and mobilize with assistive devices will improve Outcome: Progressing   Problem: Clinical Measurements: Goal: Postoperative complications will be avoided or minimized Outcome: Progressing Bp elevated overnight in correlation to increased pain   Problem: Pain Management: Goal: Pain level will decrease with appropriate interventions Outcome: Progressing Alternating oxycodone and dilaudid to help pain. Declines ice pack to surgical site.   Problem: Skin Integrity: Goal: Demonstration of wound healing without infection will improve Outcome: Progressing  Paged overnight team to notify of increasing high cbg New orders for sub q insulin given. Will continue to monitor.

## 2017-08-29 ENCOUNTER — Encounter (HOSPITAL_COMMUNITY): Payer: Self-pay | Admitting: Physical Medicine and Rehabilitation

## 2017-08-29 DIAGNOSIS — Z89511 Acquired absence of right leg below knee: Secondary | ICD-10-CM

## 2017-08-29 LAB — CBC WITH DIFFERENTIAL/PLATELET
Basophils Absolute: 0 10*3/uL (ref 0.0–0.1)
Basophils Relative: 0 %
Eosinophils Absolute: 0.2 10*3/uL (ref 0.0–0.7)
Eosinophils Relative: 2 %
HEMATOCRIT: 32 % — AB (ref 39.0–52.0)
Hemoglobin: 10.6 g/dL — ABNORMAL LOW (ref 13.0–17.0)
LYMPHS PCT: 27 %
Lymphs Abs: 2.6 10*3/uL (ref 0.7–4.0)
MCH: 27.3 pg (ref 26.0–34.0)
MCHC: 33.1 g/dL (ref 30.0–36.0)
MCV: 82.5 fL (ref 78.0–100.0)
MONO ABS: 0.7 10*3/uL (ref 0.1–1.0)
MONOS PCT: 8 %
NEUTROS ABS: 6 10*3/uL (ref 1.7–7.7)
Neutrophils Relative %: 63 %
Platelets: 389 10*3/uL (ref 150–400)
RBC: 3.88 MIL/uL — ABNORMAL LOW (ref 4.22–5.81)
RDW: 13.3 % (ref 11.5–15.5)
WBC: 9.5 10*3/uL (ref 4.0–10.5)

## 2017-08-29 LAB — GLUCOSE, CAPILLARY
GLUCOSE-CAPILLARY: 194 mg/dL — AB (ref 65–99)
GLUCOSE-CAPILLARY: 117 mg/dL — AB (ref 65–99)
GLUCOSE-CAPILLARY: 123 mg/dL — AB (ref 65–99)
Glucose-Capillary: 246 mg/dL — ABNORMAL HIGH (ref 65–99)
Glucose-Capillary: 176 mg/dL — ABNORMAL HIGH (ref 65–99)

## 2017-08-29 LAB — BASIC METABOLIC PANEL
Anion gap: 8 (ref 5–15)
BUN: 22 mg/dL — AB (ref 6–20)
CALCIUM: 7.9 mg/dL — AB (ref 8.9–10.3)
CO2: 26 mmol/L (ref 22–32)
CREATININE: 1.33 mg/dL — AB (ref 0.61–1.24)
Chloride: 103 mmol/L (ref 101–111)
GFR calc Af Amer: >60 mL/min (ref >60)
GFR calc non Af Amer: >60 mL/min (ref >60)
GLUCOSE: 265 mg/dL — AB (ref 65–99)
Potassium: 4.3 mmol/L (ref 3.5–5.1)
Sodium: 137 mmol/L (ref 135–145)

## 2017-08-29 LAB — MAGNESIUM: Magnesium: 2 mg/dL (ref 1.7–2.4)

## 2017-08-29 LAB — CULTURE, BLOOD (ROUTINE X 2)
CULTURE: NO GROWTH
Culture: NO GROWTH
SPECIAL REQUESTS: ADEQUATE
Special Requests: ADEQUATE

## 2017-08-29 MED ORDER — POLYETHYLENE GLYCOL 3350 17 G PO PACK: 17.0000 g | PACK | Freq: Every day | ORAL | 0 refills | Status: DC | PRN

## 2017-08-29 MED ORDER — ONDANSETRON HCL 4 MG PO TABS: 4.0000 mg | ORAL_TABLET | Freq: Four times a day (QID) | ORAL | 0 refills | Status: DC | PRN

## 2017-08-29 MED ORDER — INSULIN GLARGINE 100 UNIT/ML ~~LOC~~ SOLN
25.0000 [IU] | Freq: Two times a day (BID) | SUBCUTANEOUS | Status: DC
  Administered 2017-08-29 – 2017-09-03 (×11): 25 [IU] via SUBCUTANEOUS
  Filled 2017-08-29 (×12): qty 0.25

## 2017-08-29 MED ORDER — METHOCARBAMOL 500 MG PO TABS: 500.0000 mg | ORAL_TABLET | Freq: Four times a day (QID) | ORAL | 0 refills | Status: DC | PRN

## 2017-08-29 MED ORDER — OXYCODONE HCL 5 MG PO TABS: 5.0000 mg | ORAL_TABLET | Freq: Four times a day (QID) | ORAL | 0 refills | Status: DC | PRN

## 2017-08-29 MED ORDER — LIVING WELL WITH DIABETES BOOK
Freq: Once | Status: AC
  Administered 2017-08-29: 22:00:00
  Filled 2017-08-29: qty 1

## 2017-08-29 MED ORDER — INSULIN PEN NEEDLE 31G X 5 MM MISC: 0 refills | Status: DC

## 2017-08-29 MED ORDER — INSULIN DETEMIR 100 UNIT/ML FLEXPEN: 24.0000 [IU] | PEN_INJECTOR | Freq: Two times a day (BID) | SUBCUTANEOUS | 0 refills | Status: DC

## 2017-08-29 MED ORDER — BLOOD GLUCOSE METER KIT: PACK | 0 refills | Status: DC

## 2017-08-29 MED ORDER — INSULIN ASPART 100 UNIT/ML FLEXPEN: 5.0000 [IU] | PEN_INJECTOR | Freq: Three times a day (TID) | SUBCUTANEOUS | 0 refills | Status: DC

## 2017-08-29 MED ORDER — DOCUSATE SODIUM 100 MG PO CAPS: 100.0000 mg | ORAL_CAPSULE | Freq: Two times a day (BID) | ORAL | 0 refills | Status: DC

## 2017-08-29 NOTE — Progress Notes (Signed)
Assumed care of pt from off going nurse. No changes in assessment. Condition stable at this time 

## 2017-08-29 NOTE — Care Management Note (Signed)
Case Management Note  Patient Details  Name: Christopher Callahan MRN: [id number] Date of Birth: 06/06/1990  Subjective/Objective:    Admitted with Abscess, Osteomyelitis Right Foot.  s/p Transtibial amputation Application of Prevena wound VAC   Action/Plan: NCM spoke with pt regarding discharge plan. Pt lives with mom and grandmother. Mother works. Pt thought he could get the same type of therapy @ home as he would CIR. NCM explained the difference to pt. Pt had only worked with PT once since surgery , from bed to chair. NCM notified MD of d/c plan to home. PT evaluation reorder and d/c placed on hold.   Expected Discharge Date:  08/29/17               Expected Discharge Plan:  Home w Home Health Services  In-House Referral:  Clinical Social Work  Discharge planning Services  CM Consult  Post Acute Care Choice:    Choice offered to:  Patient  DME Arranged:    DME Agency:     HH Arranged:    HH Agency:     Status of Service:  In process, will continue to follow  If discussed at Long Length of Stay Meetings, dates discussed:    Additional Comments:  Epifanio LeschesCole, Levi Klaiber Hudson, RN 08/29/2017, 3:01 PM

## 2017-08-29 NOTE — Consult Note (Signed)
Physical Medicine and Rehabilitation Consult   Reason for Consult: R-BKA due to osteomyelitis and foot abscess.  Referring Physician: Dr. Hanley BenAlekh   HPI: Christopher Callahan is a 27 y.o. male with history of T1DM with neuropathy, tobacco use, chronic diabetic foot ulcers with worsening despite antibiotics. He was admitted on 08/24/17 with increase drainage, malaise with aches and pain. Patient reports taking medications x weeks and inability to manage (not acting right) at home [place] [facility] [place] [facility]  He was started on broad spectrum antibiotics for sepsis due to right foot osteomyelitis with abscess and IV insulin due to BS 856 as well as fluids for AKI. Dr. 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 Post op continues to have poorly controlled BS. Hyponatremia nad leucocytosis have resolved.  Therapy evaluation done and CIR recommended due to functional deficits.     Review of Systems  Constitutional: Negative for fever.  HENT: Negative for tinnitus.   Respiratory: Negative for cough.   Cardiovascular: Negative for palpitations.  Gastrointestinal: Negative for abdominal pain.  Musculoskeletal: Positive for joint pain and myalgias.  Skin: Negative for itching.  Neurological: Negative for headaches.  Psychiatric/Behavioral: Negative for substance abuse.      Past Medical History:  Diagnosis Date  . Diabetes mellitus without complication [facility](HCC)     Past Surgical History:  Procedure Laterality Date  . AMPUTATION Right 08/27/2017   Procedure: RIGHT BELOW KNEE AMPUTATION;  Surgeon: Nadara Mustarduda, Marcus V, MD;  [facility];  Service: Orthopedics;  Laterality: Right;  . CHOLECYSTECTOMY    . SKIN GRAFT      Family History  Problem Relation Age of Onset  . COPD Father      Social History:  Single. 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 Now plans on staying with mother who works out of home. He reports that he has been smoking cigarettes.  He has been smoking about  0.30 packs per day. He has never used smokeless tobacco. He reports that he does not drink alcohol or use drugs.    Allergies: No Known Allergies    Medications Prior to Admission  Medication Sig Dispense Refill  . insulin aspart (NOVOLOG) 100 UNIT/ML FlexPen Inject 5 Units into the skin 3 (three) times daily.    . Insulin Detemir (LEVEMIR FLEXTOUCH) 100 UNIT/ML Pen Inject 24 Units into the skin 2 (two) times daily.    Marland Kitchen. dicyclomine (BENTYL) 20 MG tablet Take 1 tablet (20 mg total) by mouth 2 (two) times daily as needed for spasms (abdominal pain). (Patient not taking: Reported on 08/24/2017) 10 tablet 0  . metoCLOPramide (REGLAN) 5 MG tablet Take 1 tablet (5 mg total) by mouth every 8 (eight) hours as needed for nausea. (Patient not taking: Reported on 08/24/2017) 15 tablet 0  . promethazine (PHENERGAN) 25 MG tablet Take 1 tablet (25 mg total) by mouth every 6 (six) hours as needed for nausea or vomiting. (Patient not taking: Reported on 08/24/2017) 30 tablet 0    Home: Home Living Family/patient expects to be discharged to:: Inpatient rehab  Functional History: Prior Function Level of Independence: Independent Comments: lives with his mother Functional Status:  Mobility: Bed Mobility Overal bed mobility: Independent Transfers Overall transfer level: Needs assistance Equipment used: Rolling walker (2 wheeled), 1 person hand held assist Transfers: Sit to/from Stand, Stand Pivot Transfers Sit to Stand: Min assist, Mod assist Stand pivot transfers: Min assist, Mod assist General transfer comment: reminders for hand placement Ambulation/Gait  Ambulation/Gait assistance: Min assist Ambulation Distance (Feet): 6 Feet Assistive device: Rolling walker (2 wheeled), 1 person hand held assist Gait Pattern/deviations: Step-through pattern General Gait Details: hops to L on RW for transition to chair. Gait velocity: reduced Gait velocity interpretation: Below normal speed for age/gender     ADL:    Cognition: Cognition Overall Cognitive Status: Within Functional Limits for tasks assessed Orientation Level: Oriented X4 Cognition Arousal/Alertness: Awake/alert Behavior During Therapy: WFL for tasks assessed/performed Overall Cognitive Status: Within Functional Limits for tasks assessed   Blood pressure 132/84, pulse 93, temperature 97.7 F (36.5 C), temperature source Oral, resp. rate 12, height 6\' 1"  (1.854 m), weight 106.6 kg (235 lb), SpO2 98 %. Physical Exam  Nursing note and vitals reviewed. Constitutional: He is oriented to person, place, and time. He appears well-developed and well-nourished. No distress.  HENT:  Head: Normocephalic and atraumatic.  Eyes: Pupils are equal, round, and reactive to light. Conjunctivae and EOM are normal.  Neck: Normal range of motion.  Cardiovascular: Normal rate and regular rhythm.  No murmur heard. Respiratory: Effort normal and breath sounds normal. No stridor. No respiratory distress. He has no wheezes.  GI: Soft. Bowel sounds are normal. He exhibits no distension. There is no tenderness.  Musculoskeletal:  Long well healed scar on dorsal surface of left foot and callused ulcer on plantar surface under great toe. Keeps RLE rotated outward with knee flexed. R-BKA site with compression sock and VAC in place.   Neurological: He is alert and oriented to person, place, and time.  Speech clear. Able to follow basic commands without difficulty. UE 5/5. LLE 4-5/5. Able to lift right leg against gravity  Skin: Skin is warm and dry. He is not diaphoretic.  Psychiatric: He has a normal mood and affect. His behavior is normal. Thought content normal.    Results for orders placed or performed during the hospital encounter of 08/24/17 (from the past 24 hour(s))  Glucose, capillary     Status: Abnormal   Collection Time: 08/28/17 11:56 AM  Result Value Ref Range   Glucose-Capillary 430 (H) 65 - 99 mg/dL  Glucose, capillary     Status:  Abnormal   Collection Time: 08/28/17  4:33 PM  Result Value Ref Range   Glucose-Capillary 248 (H) 65 - 99 mg/dL  Glucose, capillary     Status: Abnormal   Collection Time: 08/28/17 10:08 PM  Result Value Ref Range   Glucose-Capillary 290 (H) 65 - 99 mg/dL  CBC with Differential/Platelet     Status: Abnormal   Collection Time: 08/29/17  6:40 AM  Result Value Ref Range   WBC 9.5 4.0 - 10.5 K/uL   RBC 3.88 (L) 4.22 - 5.81 MIL/uL   Hemoglobin 10.6 (L) 13.0 - 17.0 g/dL   HCT 16.1 (L) 09.6 - 04.5 %   MCV 82.5 78.0 - 100.0 fL   MCH 27.3 26.0 - 34.0 pg   MCHC 33.1 30.0 - 36.0 g/dL   RDW 40.9 81.1 - 91.4 %   Platelets 389 150 - 400 K/uL   Neutrophils Relative % 63 %   Neutro Abs 6.0 1.7 - 7.7 K/uL   Lymphocytes Relative 27 %   Lymphs Abs 2.6 0.7 - 4.0 K/uL   Monocytes Relative 8 %   Monocytes Absolute 0.7 0.1 - 1.0 K/uL   Eosinophils Relative 2 %   Eosinophils Absolute 0.2 0.0 - 0.7 K/uL   Basophils Relative 0 %   Basophils Absolute 0.0 0.0 - 0.1 K/uL  Basic metabolic panel     Status: Abnormal   Collection Time: 08/29/17  6:40 AM  Result Value Ref Range   Sodium 137 135 - 145 mmol/L   Potassium 4.3 3.5 - 5.1 mmol/L   Chloride 103 101 - 111 mmol/L   CO2 26 22 - 32 mmol/L   Glucose, Bld 265 (H) 65 - 99 mg/dL   BUN 22 (H) 6 - 20 mg/dL   Creatinine, Ser 8.29 (H) 0.61 - 1.24 mg/dL   Calcium 7.9 (L) 8.9 - 10.3 mg/dL   GFR calc non Af Amer >60 >60 mL/min   GFR calc Af Amer >60 >60 mL/min   Anion gap 8 5 - 15  Magnesium     Status: None   Collection Time: 08/29/17  6:40 AM  Result Value Ref Range   Magnesium 2.0 1.7 - 2.4 mg/dL  Glucose, capillary     Status: Abnormal   Collection Time: 08/29/17  8:21 AM  Result Value Ref Range   Glucose-Capillary 246 (H) 65 - 99 mg/dL   No results found.  Assessment/Plan: Diagnosis: right BKA 1. Does the need for close, 24 hr/day medical supervision in concert with the patient's rehab needs make it unreasonable for this patient to be served  in a less intensive setting? No 2. Co-Morbidities requiring supervision/potential complications: diabetes, osteomyelitis 3. Due to bladder management, bowel management, safety, skin/wound care, medication administration and pain management, does the patient require 24 hr/day rehab nursing? No and Potentially 4. Does the patient require coordinated care of a physician, rehab nurse, PT (1-2 hrs/day, 5 days/week) and OT (1-2 hrs/day, 5 days/week) to address physical and functional deficits in the context of the above medical diagnosis(es)? No and Potentially Addressing deficits in the following areas: balance, endurance, locomotion, strength, transferring, bowel/bladder control, bathing, dressing, feeding, grooming and toileting 5. Can the patient actively participate in an intensive therapy program of at least 3 hrs of therapy per day at least 5 days per week? Yes 6. The potential for patient to make measurable gains while on inpatient rehab is fair 7. Anticipated functional outcomes upon discharge from inpatient rehab are n/a  with PT, n/a with OT, n/a with SLP. 8. Estimated rehab length of stay to reach the above functional goals is: n/a 9. Anticipated D/C setting: Home 10. Anticipated post D/C treatments: HH therapy 11. Overall Rehab/Functional Prognosis: good  RECOMMENDATIONS: This patient's condition is appropriate for continued rehabilitative care in the following setting: [facility] Therapy Patient has agreed to participate in recommended program. Yes Note that insurance prior authorization may be required for reimbursement for recommended care.  Comment:   Ranelle Oyster, MD, [facility] Physical Medicine & Rehabilitation 08/29/2017    Jacquelynn Cree, PA-C 08/29/2017

## 2017-08-29 NOTE — Progress Notes (Signed)
Rehab admissions - I met with patient and I confirmed that he does want to discharge home with [facility] once he is medically ready. He does have help at home per patient.  Call me for questions.  #[phone]

## 2017-08-29 NOTE — Progress Notes (Signed)
Spoke with patient. States that he takes Levemir 25 U BID, Novolog 5 U TID. Was diagnosed at 27 years old. Started on insulin at 27 years old. Does not have a PCP since [place] just [place] here from [place]. He is living with his mother. Is on his deceased father's insurance which he confirmed with insurance company. States that he will find a PCP to follow after discharge. Ordered Living Well with Diabetes booklet.   Smith MinceKendra Jackelyne Sayer RN BSN CDE Diabetes Coordinator Pager: [phone]  8am-5pm

## 2017-08-29 NOTE — Progress Notes (Addendum)
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  Pt with significant PMH of DM type 1 and skin graft on his left leg.     PT Comments    Pt reports he was not thinking when he told the rehab admission coordinator that he wanted to go home.  He thought that home therapy would provide him with the same intensity as inpatient rehab.  I brought up the 17 steps he needed to do to get into his apartment and he said, "I have not even thought about that".  He has only been able to hop a short distance with RW and is not yet strong enough to hop up a flight of stairs.   He reports that he is still in a bit of shock over loosing his R lower leg (processing the loss) and that he has not slept in days since surgery.  He reports to me that his grandmother is home with him all day, but was unable to provide him with physical assist and his mother works during the day.  He is near tears. He needs a significant amount of education re: his new physical state and caring for his residual limb in prep for a prosthesis.  He remains very appropriate for intensive inpatient rehab therapies prior to return home to a second floor apartment.  I spoke with RN CM and left a message for the CIR coordinator re: re-assessing his situation.  PT will continue to follow acutely.   Follow Up Recommendations  CIR     Equipment Recommendations  Rolling walker with 5" wheels;Wheelchair (measurements PT);Wheelchair cushion (measurements PT);Other (comment);3in1 (PT)(tub transfer bench, WC with R amputee pad)    Recommendations for Other Services   NA     Precautions / Restrictions Precautions Precautions: Fall    Mobility  Bed Mobility Overal bed mobility: Modified Independent              General bed mobility comments: HOB elevated, pt using railing for leverage.  Pt reports he sleeps in a queen bed at home that is a bit low.   Transfers Overall transfer level: Needs assistance Equipment used: Rolling walker (2 wheeled) Transfers: Sit to/from Stand Sit to Stand: Min assist;From elevated surface         General transfer comment: Min assist to transition to stand to support trunk for balance and stabilize RW to prevent tipping during transition to stand.    Ambulation/Gait Ambulation/Gait assistance: Min assist Ambulation Distance (Feet): 5 Feet Assistive device: Rolling walker (2 wheeled);1 person hand held assist Gait Pattern/deviations: Step-to pattern(hop to)     General Gait Details: Min assist to support trunk for blalance while hopping forward.  Assist to maneuver and block RW from sliding forward to far as well.  Pt with limited UE endurance due to weakness and having to lift his own body weight on his arms (#235 per chart).           Balance Overall balance assessment: Needs assistance Sitting-balance support: Feet supported(left foot supported) Sitting balance-Leahy Scale: Good     Standing balance support: Bilateral upper extremity supported Standing balance-Leahy Scale: Poor Standing balance comment: needs support from RW and therapist.  Cognition Arousal/Alertness: Awake/alert Behavior During Therapy: WFL for tasks assessed/performed Overall Cognitive Status: Within Functional Limits for tasks assessed                                 General Comments: Pt is anxious, still processing that he lost his leg, exhausted from not sleeping for days, on the verge of tears.      Exercises      General Comments General comments (skin integrity, edema, etc.): Pt did not have his limb protector on, educated him on the importance of this to protect his leg, prevent knee flexion contractures.   Educated on phantom limb pain.       Pertinent Vitals/Pain Pain Assessment: Faces Faces Pain Scale: Hurts even more Pain Location: R residual limb, phantom foot pain Pain Descriptors / Indicators: Operative site guarding Pain Intervention(s): Limited activity within patient's tolerance;Monitored during session;Repositioned           PT Goals (current goals can now be found in the care plan section) Acute Rehab PT Goals Patient Stated Goal: to get a prosthesis Progress towards PT goals: Progressing toward goals    Frequency    Min 3X/week      PT Plan Current plan remains appropriate       AM-PAC PT "6 Clicks" Daily Activity  Outcome Measure  Difficulty turning over in bed (including adjusting bedclothes, sheets and blankets)?: None Difficulty moving from lying on back to sitting on the side of the bed? : None Difficulty sitting down on and standing up from a chair with arms (e.g., wheelchair, bedside commode, etc,.)?: Unable Help needed moving to and from a bed to chair (including a wheelchair)?: A Little Help needed walking in hospital room?: A Little Help needed climbing 3-5 steps with a railing? : Total 6 Click Score: 16    End of Session   Activity Tolerance: Patient limited by fatigue;Patient limited by pain Patient left: in bed;with call bell/phone within reach   PT Visit Diagnosis: Unsteadiness on feet (R26.81);Muscle weakness (generalized) (M62.81);Difficulty in walking, not elsewhere classified (R26.2);Pain Pain - Right/Left: Right Pain - part of body: Leg     Time: 1630-1650 PT Time Calculation (min) (ACUTE ONLY): 20 min  Charges:  $Therapeutic Activity: 8-22 mins          Christopher Callahan B. Christopher Callahan, PT, DPT [phone]            08/29/2017, 5:44 PM

## 2017-08-29 NOTE — Progress Notes (Signed)
Report received. Pt resting quietly in bed. No changes in initial AM assessment at this time

## 2017-08-29 NOTE — Progress Notes (Signed)
CSW consulted with CSW Director regarding discharge plan. Patient stating he believed he would have more assistance at home with home health than he actually would. Per Director, at this time, we are unable to wait for SNF insurance approval in the hospital. At this time recommending home health especially due to patient's age.   Osborne Cascoadia Rether Rison LCSW [phone]

## 2017-08-29 NOTE — Discharge Summary (Addendum)
Physician Discharge Summary  [place] ZMO:[id number] DOB: Apr 27, 1991 DOA: 08/24/2017  PCP: Oleta Mouse, PA-C  Admit date: 08/24/2017 Discharge date: 08/29/2017  Admitted From: Home Disposition:  Home  Recommendations for Outpatient Follow-up:  1. Follow up with PCP in 1 week 2. Follow-up with Dr. Sharol Given at earliest convenience 3. Wound care/wound VAC care as per Dr. Gavin Potters recommendations   [place]: Yes Equipment/Devices: Wound VAC  Discharge Condition: Stable CODE STATUS: Full Diet recommendation: Heart Healthy / Carb Modified    Brief/Interim Summary: 27 year old male with history of diabetes mellitus type 1 complicated by diabetic neuropathy of bilateral feet, diabetic foot wound, questionable gastroparesis and ongoing tobacco use presented with worsening right foot wound.  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  He was started on broad-spectrum antibiotics and orthopedics was consulted.  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  PT recommended CIR placement.  Patient wanted to go home.  Patient was evaluated by CIR who recommended home health.    He was supposed to be discharged to home with home health but PT recommended to CIR again.  Patient is waiting to be discharge to CIR.  Hemodynamically stable currently.  Discharge to CIR once bed is available.   Discharge Diagnoses:  Active Problems:   Severe sepsis (HCC)   Diabetic foot infection ([place])   Sepsis ([place])   Subacute osteomyelitis, right ankle and foot ([place])  Severe sepsis probably due to right foot osteomyelitis -Resolved.  Currently afebrile.  Cultures negative so far.   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  Currently has a wound VAC.  Wound care and wound VAC care as per orthopedics recommendations. -Antibiotics have been discontinued after the amputation.. -Patient is currently stable  for discharge.  Discharge home with home health.  Patient wanted to go home although PT initially recommended CIR placement.  Patient was evaluated by CIR provider who recommended home health as well.  Leukocytosis -Probably secondary to above -Resolved  Lactic acidosis -Secondary to above.  Resolved   Diabetes mellitus type 1 uncontrolled with hyperglycemia -Hemoglobin A1c is 11.1.   -Continue Levemir and NovoLog at home.  Compliant with medications.  Outpatient follow-up with primary care provider  Hyponatremia -Probably secondary to hyperglycemia and dehydration -Improved  Ongoing tobacco use -Patient was counseled regarding cessation  Obesity -Outpatient follow-up  Hypokalemia -Resolved.     Discharge Instructions  Discharge Instructions    Call MD for:  difficulty breathing, headache or visual disturbances   Complete by:  As directed    Call MD for:  extreme fatigue   Complete by:  As directed    Call MD for:  hives   Complete by:  As directed    Call MD for:  persistant dizziness or light-headedness   Complete by:  As directed    Call MD for:  persistant nausea and vomiting   Complete by:  As directed    Call MD for:  redness, tenderness, or signs of infection (pain, swelling, redness, odor or green/yellow discharge around incision site)   Complete by:  As directed    Call MD for:  severe uncontrolled pain   Complete by:  As directed    Call MD for:  temperature >100.4   Complete by:  As directed    Diet - low sodium heart healthy   Complete by:  As directed    Diet Carb Modified   Complete by:  As directed  Discharge instructions   Complete by:  As directed    Wound care/wound VAC care as per orthopedics recommendations Activity as per PT recommendations   Increase activity slowly   Complete by:  As directed      Allergies as of 09/02/2017   No Known Allergies     Medication List    STOP taking these medications   dicyclomine 20 MG  tablet Commonly known as:  BENTYL   metoCLOPramide 5 MG tablet Commonly known as:  REGLAN   promethazine 25 MG tablet Commonly known as:  PHENERGAN     TAKE these medications   amLODipine 5 MG tablet Commonly known as:  NORVASC Take 1 tablet (5 mg total) by mouth daily. Start taking on:  09/03/2017   blood glucose meter kit and supplies Dispense based on patient and insurance preference. Use up to 3 times daily. (FOR ICD-10 E10.9, E11.9).   docusate sodium 100 MG capsule Commonly known as:  COLACE Take 1 capsule (100 mg total) by mouth 2 (two) times daily.   feeding supplement (GLUCERNA SHAKE) Liqd Take 237 mLs by mouth 3 (three) times daily between meals.   insulin aspart 100 UNIT/ML FlexPen Commonly known as:  NOVOLOG Inject 5 Units into the skin 3 (three) times daily.   Insulin Detemir 100 UNIT/ML Pen Commonly known as:  LEVEMIR FLEXTOUCH Inject 24 Units into the skin 2 (two) times daily.   Insulin Pen Needle 31G X 5 MM Misc Take insulin as prescribed   methocarbamol 500 MG tablet Commonly known as:  ROBAXIN Take 1 tablet (500 mg total) by mouth every 6 (six) hours as needed for muscle spasms.   ondansetron 4 MG tablet Commonly known as:  ZOFRAN Take 1 tablet (4 mg total) by mouth every 6 (six) hours as needed for nausea.   oxyCODONE 5 MG immediate release tablet Commonly known as:  Oxy IR/ROXICODONE Take 1-2 tablets (5-10 mg total) by mouth every 6 (six) hours as needed for moderate pain (pain score 4-6).   polyethylene glycol packet Commonly known as:  MIRALAX / GLYCOLAX Take 17 g by mouth daily as needed for mild constipation.            Durable Medical Equipment  (From admission, onward)        Start     Ordered   08/29/17 1422  For home use only DME Walker rolling  Once    Question:  Patient needs a walker to treat with the following condition  Answer:  S/P bilateral BKA (below knee amputation) ([place])   08/29/17 1422      Follow-up  Information    Newt Minion, MD In 1 week.   Specialty:  Orthopedic Surgery Contact information: [place] [address] [phone]        PCP. Schedule an appointment as soon as possible for a visit in 1 week(s).          No Known Allergies  Consultations:  Orthopedics   Procedures/Studies: Dg Foot Complete Right  Result Date: 08/24/2017 CLINICAL DATA:  Diabetic foot ulcer 1 month. EXAM: RIGHT FOOT COMPLETE - 3+ VIEW COMPARISON:  None. FINDINGS: Examination demonstrates mottled air collection within th plantar and dorsal soft tissues adjacent the metatarsals and level of the proximal phalanges most prominent adjacent the second and third toes. Minimal increased lucency over the head of the second and third metatarsals, although no definite bone destruction. No evidence of fracture or dislocation. IMPRESSION: Moderate mottled air collection  over the plantar and dorsal aspect of the mid to forefoot region worse adjacent the second and third toes likely soft tissue infection. Possible increased lucency over the head of the second and third metatarsals as osteomyelitis is possible. Electronically Signed   By: Marin Olp M.D.   On: 08/24/2017 17:40    Right transtibial amputation on 08/27/2017   Subjective: Patient seen and examined at bedside.  He denies overnight fever, nausea or vomiting.  Discharge Exam: Vitals:   08/29/17 0122 08/29/17 0528  BP: (!) 143/92 132/84  Pulse: 99 93  Resp: 13 12  Temp: 98.7 F (37.1 C) 97.7 F (36.5 C)  SpO2: 99% 98%   Vitals:   08/28/17 2122 08/28/17 2209 08/29/17 0122 08/29/17 0528  BP: (!) 145/95  (!) 143/92 132/84  Pulse: 99  99 93  Resp: _0 Temp:  97.6 F (36.4 C) 98.7 F (37.1 C) 97.7 F (36.5 C)  TempSrc: Oral   Oral  SpO2: 97%  99% 98%  Weight:      Height:        General: Pt is alert, awake, not in acute distress Cardiovascular: Rate controlled, S1/S2 + Respiratory: Bilateral  decreased breath sounds at bases Abdominal: Soft, NT, ND, bowel sounds + Extremities: Right BKA with wound VAC present, no cyanosis    The results of significant diagnostics from this hospitalization (including imaging, microbiology, ancillary and laboratory) are listed below for reference.     Microbiology: Recent Results (from the past 240 hour(s))  Blood culture (routine x 2)     Status: None (Preliminary result)   Collection Time: 08/24/17  4:15 PM  Result Value Ref Range Status   Specimen Description BLOOD LEFT ANTECUBITAL  Final   Special Requests AEROBIC BOTTLE ONLY Blood Culture adequate volume  Final   Culture   Final    NO GROWTH 4 DAYS Performed at [facility], [address], [place], [place] [address]    Report Status PENDING  Incomplete  Blood culture (routine x 2)     Status: None (Preliminary result)   Collection Time: 08/24/17  5:19 PM  Result Value Ref Range Status   Specimen Description BLOOD RIGHT ANTECUBITAL  Final   Special Requests   Final    BOTTLES DRAWN AEROBIC AND ANAEROBIC Blood Culture adequate volume   Culture   Final    NO GROWTH 4 DAYS Performed at [facility], [place] [address], [place], [place] [address]    Report Status PENDING  Incomplete  Urine culture     Status: None   Collection Time: 08/24/17  6:50 PM  Result Value Ref Range Status   Specimen Description URINE, RANDOM  Final   Special Requests NONE  Final   Culture   Final    NO GROWTH Performed at [facility], [address], [place], [place] [address]    Report Status 08/25/2017 FINAL  Final  MRSA PCR Screening     Status: Abnormal   Collection Time: 08/24/17  9:26 PM  Result Value Ref Range Status   MRSA by PCR POSITIVE (A) NEGATIVE Final    Comment:        The GeneXpert MRSA Assay (FDA approved for NASAL specimens only), is one component of a comprehensive MRSA colonization surveillance program. It is not intended to diagnose MRSA infection nor to  guide or monitor treatment for MRSA infections. RESULT CALLED TO, READ BACK BY AND VERIFIED WITH: ROMESBERG,K RN  2637 08/25/17 MITCHELL,L Performed at [facility] [address], [place], [place] [address]      Labs: BNP (last 3 results) No results for input(s): BNP in the last 8760 hours. Basic Metabolic Panel: Recent Labs  Lab 08/25/17 0632 08/26/17 0542 08/27/17 0604 08/28/17 0420 08/29/17 0640  NA 131* 132* 133* 129* 137  K 3.2* 3.3* 3.9 4.5 4.3  CL 99* 100* 98* 97* 103  CO2 _0 GLUCOSE 255* 211* 190* 490* 265*  BUN _1 22*  CREATININE 0.98 1.02 1.01 1.23 1.33*  CALCIUM 7.4* 7.4* 8.0* 7.5* 7.9*  MG  --  1.8 2.0 2.2 2.0   Liver Function Tests: Recent Labs  Lab 08/24/17 1615 08/25/17 0632  AST 19 18  ALT 16* 17  ALKPHOS 198* 143*  BILITOT 0.8 0.6  PROT 7.1 6.0*  ALBUMIN 2.3* 1.9*   No results for input(s): LIPASE, AMYLASE in the last 168 hours. No results for input(s): AMMONIA in the last 168 hours. CBC: Recent Labs  Lab 08/24/17 1615 08/25/17 0277 08/26/17 0542 08/27/17 0604 08/28/17 0420 08/29/17 0640  WBC 25.9* 20.8* 19.5* 21.1* 13.2* 9.5  NEUTROABS 23.3*  --  15.7* 17.3* 11.4* 6.0  HGB 13.1 10.8* 10.3* 11.2* 11.2* 10.6*  HCT 38.7* 31.1* 30.0* 32.4* 33.2* 32.0*  MCV 83.9 79.9 80.2 81.2 82.6 82.5  PLT 322 290 275 319 362 389   Cardiac Enzymes: No results for input(s): CKTOTAL, CKMB, CKMBINDEX, TROPONINI in the last 168 hours. BNP: Invalid input(s): POCBNP CBG: Recent Labs  Lab 08/28/17 1633 08/28/17 2208 08/29/17 0821 08/29/17 1220 08/29/17 1323  GLUCAP 248* 290* 246* 194* 176*   D-Dimer No results for input(s): DDIMER in the last 72 hours. Hgb A1c No results for input(s): HGBA1C in the last 72 hours. Lipid Profile No results for input(s): CHOL, HDL, LDLCALC, TRIG, CHOLHDL, LDLDIRECT in the last 72 hours. Thyroid function studies No results for input(s): TSH, T4TOTAL, T3FREE, THYROIDAB in the last 72  hours.  Invalid input(s): FREET3 Anemia work up No results for input(s): VITAMINB12, FOLATE, FERRITIN, TIBC, IRON, RETICCTPCT in the last 72 hours. Urinalysis    Component Value Date/Time   COLORURINE COLORLESS (A) 08/24/2017 1850   APPEARANCEUR CLEAR 08/24/2017 1850   LABSPEC 1.023 08/24/2017 1850   PHURINE 6.0 08/24/2017 1850   GLUCOSEU >=500 (A) 08/24/2017 1850   HGBUR SMALL (A) 08/24/2017 1850   BILIRUBINUR NEGATIVE 08/24/2017 1850   KETONESUR NEGATIVE 08/24/2017 1850   PROTEINUR NEGATIVE 08/24/2017 1850   NITRITE NEGATIVE 08/24/2017 1850   LEUKOCYTESUR NEGATIVE 08/24/2017 1850   Sepsis Labs Invalid input(s): PROCALCITONIN,  WBC,  LACTICIDVEN Microbiology Recent Results (from the past 240 hour(s))  Blood culture (routine x 2)     Status: None (Preliminary result)   Collection Time: 08/24/17  4:15 PM  Result Value Ref Range Status   Specimen Description BLOOD LEFT ANTECUBITAL  Final   Special Requests AEROBIC BOTTLE ONLY Blood Culture adequate volume  Final   Culture   Final    NO GROWTH 4 DAYS Performed at [facility], [place] [address], [place], [place] [address]    Report Status PENDING  Incomplete  Blood culture (routine x 2)     Status: None (Preliminary result)   Collection Time: 08/24/17  5:19 PM  Result Value Ref Range Status   Specimen Description BLOOD RIGHT ANTECUBITAL  Final   Special Requests   Final    BOTTLES DRAWN AEROBIC AND ANAEROBIC  Blood Culture adequate volume   Culture   Final    NO GROWTH 4 DAYS Performed at [facility] [address], [place], [place] [address]    Report Status PENDING  Incomplete  Urine culture     Status: None   Collection Time: 08/24/17  6:50 PM  Result Value Ref Range Status   Specimen Description URINE, RANDOM  Final   Special Requests NONE  Final   Culture   Final    NO GROWTH Performed at [facility], [address], [place], [place] [address]    Report Status 08/25/2017 FINAL  Final  MRSA PCR  Screening     Status: Abnormal   Collection Time: 08/24/17  9:26 PM  Result Value Ref Range Status   MRSA by PCR POSITIVE (A) NEGATIVE Final    Comment:        The GeneXpert MRSA Assay (FDA approved for NASAL specimens only), is one component of a comprehensive MRSA colonization surveillance program. It is not intended to diagnose MRSA infection nor to guide or monitor treatment for MRSA infections. RESULT CALLED TO, READ BACK BY AND VERIFIED WITH: ROMESBERG,K RN 0125 08/25/17 MITCHELL,L Performed at [facility], [place] [address] [place], [place] [address]      Time coordinating discharge: 35 minutes  SIGNED:   Aline August, MD  [facility] 08/29/2017, 1:54 PM Pager: [phone]  If 7PM-7AM, please contact night-coverage [email or web address] Password [email or web address]

## 2017-08-30 LAB — GLUCOSE, CAPILLARY
GLUCOSE-CAPILLARY: 98 mg/dL (ref 65–99)
GLUCOSE-CAPILLARY: 152 mg/dL — AB (ref 65–99)
Glucose-Capillary: 127 mg/dL — ABNORMAL HIGH (ref 65–99)
Glucose-Capillary: 148 mg/dL — ABNORMAL HIGH (ref 65–99)

## 2017-08-30 NOTE — Progress Notes (Signed)
Rehab admissions - I received calls from case manager and PT saying that patient had made a mistake and that he would need inpatient rehab.  If patient remains in the hospital, I will follow up on Monday for plans and can initiate insurance pre-authorization on Monday.  Call me for questions.  #[phone]

## 2017-08-30 NOTE — Progress Notes (Signed)
Patient ID: Christopher Callahan, male   DOB: 05-Sep-1990, 27 y.o.   MRN: [id number]  PROGRESS NOTE    Christopher Callahan  WUJ:[id number] DOB: 05-Sep-1990 DOA: 08/24/2017 PCP: Jacquelynn Creeoleman, Angela, PA-C   Brief Narrative: 27 year old male with history of diabetes mellitus type 1 complicated by diabetic neuropathy of bilateral feet, diabetic foot wound, questionable gastroparesis and ongoing tobacco use presented with worsening right foot wound.  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  He was started on broad-spectrum antibiotics and orthopedics was consulted.  He had amputation on 08/27/2017.  PT recommended CIR placement.  He thought that he would manage at home and was supposed to be discharged home with home health on 08/29/2017 but PT again recommended CIR.   Assessment & Plan:   Active Problems:   Severe sepsis (HCC)   Diabetic foot infection (HCC)   Sepsis (HCC)   Subacute osteomyelitis, right ankle and foot (HCC)  Severe sepsis probably due to right foot osteomyelitis -Resolved.  Off antibiotics.  Currently afebrile hemodynamically stable.  Cultures negative so far.   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  Currently has a wound VAC as per orthopedics.  Wound care and wound VAC care as per orthopedics recommendations. -PT eval -Off antibiotics after amputation -Cultures negative so far  Leukocytosis -Probably secondary to above.  Resolved  Lactic acidosis -Secondary to above.  Resolved   Diabetes mellitus type 1 uncontrolled with hyperglycemia -Hemoglobin A1c is 11.1.   -Blood sugars much better.  Continue current regimen with Lantus and NovoLog along with sliding scale insulin  Hyponatremia -Probably secondary to hyperglycemia and dehydration -No labs today.  Repeat a.m. labs  Ongoing tobacco use -Patient was counseled regarding cessation  Obesity -Outpatient follow-up  Hypokalemia -Resolved.   No labs today.  Repeat a.m. labs  DVT prophylaxis:  Lovenox Code Status: Full Family Communication: Spoke to mother at bedside Disposition Plan: CIR once bed is available  Consultants: Orthopedics  Procedures: None  Antimicrobials: Vancomycin 08/24/2017-08/28/2017 Zosyn from 08/24/2017-08/27/2017   Subjective: Patient seen and examined at bedside.  No complaints.  No overnight fever, nausea or vomiting.   Objective: Vitals:   08/29/17 0528 08/29/17 1457 08/29/17 2148 08/30/17 0518  BP: 132/84 128/80 (!) 161/95 (!) 145/85  Pulse: 93 100 (!) 111 100  Resp: 12 16 18 18   Temp: 97.7 F (36.5 C) 97.7 F (36.5 C) 98.3 F (36.8 C) 97.9 F (36.6 C)  TempSrc: Oral Oral Oral Oral  SpO2: 98% 99% 97% 98%  Weight:      Height:        Intake/Output Summary (Last 24 hours) at 08/30/2017 1005 Last data filed at 08/30/2017 0859 Gross per 24 hour  Intake 1725 ml  Output 3450 ml  Net -1725 ml   Filed Weights   08/24/17 1606 08/27/17 1047  Weight: 106.6 kg (235 lb) 106.6 kg (235 lb)    Examination:  General exam: No distress. Extremities: Right BKA with wound VAC present    Data Reviewed: I have personally reviewed following labs and imaging studies  CBC: Recent Labs  Lab 08/24/17 1615 08/25/17 [id number] 08/26/17 0542 08/27/17 0604 08/28/17 0420 08/29/17 0640  WBC 25.9* 20.8* 19.5* 21.1* 13.2* 9.5  NEUTROABS 23.3*  --  15.7* 17.3* 11.4* 6.0  HGB 13.1 10.8* 10.3* 11.2* 11.2* 10.6*  HCT 38.7* 31.1* 30.0* 32.4* 33.2* 32.0*  MCV 83.9 79.9 80.2 81.2 82.6 82.5  PLT 322 290 275 319 362 389  Basic Metabolic Panel: Recent Labs  Lab 08/25/17 0632 08/26/17 0542 08/27/17 0604 08/28/17 0420 08/29/17 0640  NA 131* 132* 133* 129* 137  K 3.2* 3.3* 3.9 4.5 4.3  CL 99* 100* 98* 97* 103  CO2 22 22 24 22 26   GLUCOSE 255* 211* 190* 490* 265*  BUN 11 12 11 20  22*  CREATININE 0.98 1.02 1.01 1.23 1.33*  CALCIUM 7.4* 7.4* 8.0* 7.5* 7.9*  MG  --  1.8 2.0 2.2 2.0   GFR: Estimated  Creatinine Clearance: 108.8 mL/min (A) (by C-G formula based on SCr of 1.33 mg/dL (H)). Liver Function Tests: Recent Labs  Lab 08/24/17 1615 08/25/17 0632  AST 19 18  ALT 16* 17  ALKPHOS 198* 143*  BILITOT 0.8 0.6  PROT 7.1 6.0*  ALBUMIN 2.3* 1.9*   No results for input(s): LIPASE, AMYLASE in the last 168 hours. No results for input(s): AMMONIA in the last 168 hours. Coagulation Profile: Recent Labs  Lab 08/25/17 0632  INR 1.49   Cardiac Enzymes: No results for input(s): CKTOTAL, CKMB, CKMBINDEX, TROPONINI in the last 168 hours. BNP (last 3 results) No results for input(s): PROBNP in the last 8760 hours. HbA1C: No results for input(s): HGBA1C in the last 72 hours. CBG: Recent Labs  Lab 08/29/17 1220 08/29/17 1323 08/29/17 1614 08/29/17 2146 08/30/17 0823  GLUCAP 194* 176* 117* 123* 127*   Lipid Profile: No results for input(s): CHOL, HDL, LDLCALC, TRIG, CHOLHDL, LDLDIRECT in the last 72 hours. Thyroid Function Tests: No results for input(s): TSH, T4TOTAL, FREET4, T3FREE, THYROIDAB in the last 72 hours. Anemia Panel: No results for input(s): VITAMINB12, FOLATE, FERRITIN, TIBC, IRON, RETICCTPCT in the last 72 hours. Sepsis Labs: Recent Labs  Lab 08/24/17 1627 08/24/17 1838 08/24/17 2151  LATICACIDVEN 3.50* 1.95* 1.9    Recent Results (from the past 240 hour(s))  Blood culture (routine x 2)     Status: None   Collection Time: 08/24/17  4:15 PM  Result Value Ref Range Status   Specimen Description BLOOD LEFT ANTECUBITAL  Final   Special Requests AEROBIC BOTTLE ONLY Blood Culture adequate volume  Final   Culture   Final    NO GROWTH 5 DAYS Performed at [facility], [address] [place], [place] [address]    Report Status 08/29/2017 FINAL  Final  Blood culture (routine x 2)     Status: None   Collection Time: 08/24/17  5:19 PM  Result Value Ref Range Status   Specimen Description BLOOD RIGHT ANTECUBITAL  Final   Special Requests   Final     BOTTLES DRAWN AEROBIC AND ANAEROBIC Blood Culture adequate volume   Culture   Final    NO GROWTH 5 DAYS Performed at [facility], [address], [place], [place] [address]    Report Status 08/29/2017 FINAL  Final  Urine culture     Status: None   Collection Time: 08/24/17  6:50 PM  Result Value Ref Range Status   Specimen Description URINE, RANDOM  Final   Special Requests NONE  Final   Culture   Final    NO GROWTH Performed at [facility], [address], [place], [place] [address]    Report Status 08/25/2017 FINAL  Final  MRSA PCR Screening     Status: Abnormal   Collection Time: 08/24/17  9:26 PM  Result Value Ref Range Status   MRSA by PCR POSITIVE (A) NEGATIVE Final    Comment:  The GeneXpert MRSA Assay (FDA approved for NASAL specimens only), is one component of a comprehensive MRSA colonization surveillance program. It is not intended to diagnose MRSA infection nor to guide or monitor treatment for MRSA infections. RESULT CALLED TO, READ BACK BY AND VERIFIED WITH: ROMESBERG,K RN 0125 08/25/17 MITCHELL,L Performed at [facility], [address], [place], [place] [address]          Radiology Studies: No results found.      Scheduled Meds: . docusate sodium  100 mg Oral BID  . enoxaparin (LOVENOX) injection  40 mg Subcutaneous Q24H  . insulin aspart  0-15 Units Subcutaneous TID WC  . insulin aspart  0-5 Units Subcutaneous QHS  . insulin aspart  10 Units Subcutaneous TID WC  . insulin glargine  25 Units Subcutaneous BID  . sodium chloride flush  3 mL Intravenous Q12H   Continuous Infusions: . sodium chloride    . lactated ringers 10 mL/hr at 08/27/17 2100  . methocarbamol (ROBAXIN)  IV    . sodium chloride       LOS: 6 days        Glade Lloyd, MD [facility] Pager [phone]  If 7PM-7AM, please contact night-coverage [email or web address] Password [email or web address] 08/30/2017, 10:05 AM

## 2017-08-30 NOTE — Plan of Care (Addendum)
  Problem: Activity: Goal: Ability to perform//tolerate increased activity and mobilize with assistive devices will improve Outcome: Progressing   Problem: Pain Management: Goal: Pain level will decrease with appropriate interventions Outcome: Progressing   No bm since admission patient gave suppository to self No relief and drank mag citrate no BM noted.   Given Living Well with Diabetes booklet and explained it to patient

## 2017-08-31 LAB — CBC WITH DIFFERENTIAL/PLATELET
BASOS ABS: 0 10*3/uL (ref 0.0–0.1)
Basophils Relative: 0 %
EOS ABS: 0.1 10*3/uL (ref 0.0–0.7)
EOS PCT: 1 %
HCT: 32.3 % — ABNORMAL LOW (ref 39.0–52.0)
Hemoglobin: 10.5 g/dL — ABNORMAL LOW (ref 13.0–17.0)
LYMPHS ABS: 2.2 10*3/uL (ref 0.7–4.0)
LYMPHS PCT: 21 %
MCH: 27.3 pg (ref 26.0–34.0)
MCHC: 32.5 g/dL (ref 30.0–36.0)
MCV: 84.1 fL (ref 78.0–100.0)
MONO ABS: 0.9 10*3/uL (ref 0.1–1.0)
Monocytes Relative: 9 %
Neutro Abs: 7.4 10*3/uL (ref 1.7–7.7)
Neutrophils Relative %: 69 %
PLATELETS: 444 10*3/uL — AB (ref 150–400)
RBC: 3.84 MIL/uL — AB (ref 4.22–5.81)
RDW: 13.3 % (ref 11.5–15.5)
WBC: 10.6 10*3/uL — ABNORMAL HIGH (ref 4.0–10.5)

## 2017-08-31 LAB — GLUCOSE, CAPILLARY
GLUCOSE-CAPILLARY: 106 mg/dL — AB (ref 65–99)
GLUCOSE-CAPILLARY: 92 mg/dL (ref 65–99)
Glucose-Capillary: 106 mg/dL — ABNORMAL HIGH (ref 65–99)
Glucose-Capillary: 159 mg/dL — ABNORMAL HIGH (ref 65–99)

## 2017-08-31 LAB — BASIC METABOLIC PANEL
Anion gap: 8 (ref 5–15)
BUN: 14 mg/dL (ref 6–20)
CALCIUM: 8 mg/dL — AB (ref 8.9–10.3)
CO2: 26 mmol/L (ref 22–32)
CREATININE: 1.07 mg/dL (ref 0.61–1.24)
Chloride: 105 mmol/L (ref 101–111)
GFR calc Af Amer: >60 mL/min (ref >60)
GLUCOSE: 93 mg/dL (ref 65–99)
POTASSIUM: 4.3 mmol/L (ref 3.5–5.1)
SODIUM: 139 mmol/L (ref 135–145)

## 2017-08-31 LAB — MAGNESIUM: MAGNESIUM: 2 mg/dL (ref 1.7–2.4)

## 2017-08-31 MED ORDER — GLUCERNA SHAKE PO LIQD
237.0000 mL | Freq: Three times a day (TID) | ORAL | Status: DC
  Administered 2017-08-31 – 2017-09-03 (×9): 237 mL via ORAL

## 2017-08-31 NOTE — Progress Notes (Signed)
Right bka doing well - pain ok Vac in place Plan dc for this week

## 2017-08-31 NOTE — Progress Notes (Signed)
Patient ID: Christopher Callahan, male   DOB: 1991-05-19, 27 y.o.   MRN: [id number]  PROGRESS NOTE    Christopher Callahan  XBJ:[id number] DOB: 11-20-1990 DOA: 08/24/2017 PCP: Christopher Cree, PA-C   Brief Narrative: 27 year old male with history of diabetes mellitus type 1 complicated by diabetic neuropathy of bilateral feet, diabetic foot wound, questionable gastroparesis and ongoing tobacco use presented with worsening right foot wound.  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  He was started on broad-spectrum antibiotics and orthopedics was consulted.  He had amputation on 08/27/2017.  PT recommended CIR placement.  He thought that he would manage at home and was supposed to be discharged home with home health on 08/29/2017 but PT again recommended CIR.   Assessment & Plan:   Active Problems:   Severe sepsis (HCC)   Diabetic foot infection (HCC)   Sepsis (HCC)   Subacute osteomyelitis, right ankle and foot (HCC)  Severe sepsis probably due to right foot osteomyelitis -Resolved.  Off antibiotics.  Currently afebrile hemodynamically stable.  Cultures negative so far.   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  Currently has a wound VAC as per orthopedics.  Wound care and wound VAC care as per orthopedics recommendations. -PT eval -Off antibiotics after amputation -Cultures negative so far  Leukocytosis -Probably secondary to above.  Resolved  Lactic acidosis -Secondary to above.  Resolved   Diabetes mellitus type 1 uncontrolled with hyperglycemia -Hemoglobin A1c is 11.1.   -Blood sugars much better.  Continue current regimen with Lantus and NovoLog along with sliding scale insulin  Hyponatremia -Probably secondary to hyperglycemia and dehydration -Resolved.  Ongoing tobacco use -Patient was counseled regarding cessation  Obesity -Outpatient follow-up  Hypokalemia -Resolved.    DVT prophylaxis:   Lovenox Code Status: Full Family Communication: Spoke to mother at bedside Disposition Plan: CIR once bed is available  Consultants: Orthopedics  Procedures: None  Antimicrobials: Vancomycin 08/24/2017-08/28/2017 Zosyn from 08/24/2017-08/27/2017   Subjective: Patient seen and examined at bedside.  No new complaints.  States that the pain is controlled.  No overnight fever or vomiting   objective: Vitals:   08/30/17 2212 08/30/17 2236 08/30/17 2312 08/31/17 0535  BP: (!) 146/101 132/81 (!) 143/93 (!) 140/93  Pulse: (!) 110 69 (!) 105 99  Resp: 20 20  18   Temp: 98.1 F (36.7 C) 98.8 F (37.1 C)  98.3 F (36.8 C)  TempSrc:      SpO2: 100% 97%  99%  Weight:      Height:        Intake/Output Summary (Last 24 hours) at 08/31/2017 1017 Last data filed at 08/31/2017 0930 Gross per 24 hour  Intake -  Output 6335 ml  Net -6335 ml   Filed Weights   08/24/17 1606 08/27/17 1047  Weight: 106.6 kg (235 lb) 106.6 kg (235 lb)    Examination:  General exam: No distress.  Calm and comfortable Extremities: Right BKA with wound VAC present   Data Reviewed: I have personally reviewed following labs and imaging studies  CBC: Recent Labs  Lab 08/26/17 0542 08/27/17 0604 08/28/17 0420 08/29/17 0640 08/31/17 0355  WBC 19.5* 21.1* 13.2* 9.5 10.6*  NEUTROABS 15.7* 17.3* 11.4* 6.0 7.4  HGB 10.3* 11.2* 11.2* 10.6* 10.5*  HCT 30.0* 32.4* 33.2* 32.0* 32.3*  MCV 80.2 81.2 82.6 82.5 84.1  PLT 275 319 362 389 444*   Basic Metabolic Panel: Recent Labs  Lab 08/26/17 0542 08/27/17 0604 08/28/17 0420 08/29/17  1610 08/31/17 0355  NA 132* 133* 129* 137 139  K 3.3* 3.9 4.5 4.3 4.3  CL 100* 98* 97* 103 105  CO2 22 24 22 26 26   GLUCOSE 211* 190* 490* 265* 93  BUN 12 11 20  22* 14  CREATININE 1.02 1.01 1.23 1.33* 1.07  CALCIUM 7.4* 8.0* 7.5* 7.9* 8.0*  MG 1.8 2.0 2.2 2.0 2.0   GFR: Estimated Creatinine Clearance: 135.2 mL/min (by C-G formula based on SCr of 1.07 mg/dL). Liver  Function Tests: Recent Labs  Lab 08/24/17 1615 08/25/17 0632  AST 19 18  ALT 16* 17  ALKPHOS 198* 143*  BILITOT 0.8 0.6  PROT 7.1 6.0*  ALBUMIN 2.3* 1.9*   No results for input(s): LIPASE, AMYLASE in the last 168 hours. No results for input(s): AMMONIA in the last 168 hours. Coagulation Profile: Recent Labs  Lab 08/25/17 0632  INR 1.49   Cardiac Enzymes: No results for input(s): CKTOTAL, CKMB, CKMBINDEX, TROPONINI in the last 168 hours. BNP (last 3 results) No results for input(s): PROBNP in the last 8760 hours. HbA1C: No results for input(s): HGBA1C in the last 72 hours. CBG: Recent Labs  Lab 08/30/17 0823 08/30/17 1203 08/30/17 1724 08/30/17 2209 08/31/17 0753  GLUCAP 127* 98 152* 148* 106*   Lipid Profile: No results for input(s): CHOL, HDL, LDLCALC, TRIG, CHOLHDL, LDLDIRECT in the last 72 hours. Thyroid Function Tests: No results for input(s): TSH, T4TOTAL, FREET4, T3FREE, THYROIDAB in the last 72 hours. Anemia Panel: No results for input(s): VITAMINB12, FOLATE, FERRITIN, TIBC, IRON, RETICCTPCT in the last 72 hours. Sepsis Labs: Recent Labs  Lab 08/24/17 1627 08/24/17 1838 08/24/17 2151  LATICACIDVEN 3.50* 1.95* 1.9    Recent Results (from the past 240 hour(s))  Blood culture (routine x 2)     Status: None   Collection Time: 08/24/17  4:15 PM  Result Value Ref Range Status   Specimen Description BLOOD LEFT ANTECUBITAL  Final   Special Requests AEROBIC BOTTLE ONLY Blood Culture adequate volume  Final   Culture   Final    NO GROWTH 5 DAYS Performed at [facility], [address] [place], [place] [address]    Report Status 08/29/2017 FINAL  Final  Blood culture (routine x 2)     Status: None   Collection Time: 08/24/17  5:19 PM  Result Value Ref Range Status   Specimen Description BLOOD RIGHT ANTECUBITAL  Final   Special Requests   Final    BOTTLES DRAWN AEROBIC AND ANAEROBIC Blood Culture adequate volume   Culture   Final    NO GROWTH 5  DAYS Performed at [facility], [address] [place], [place] [address]    Report Status 08/29/2017 FINAL  Final  Urine culture     Status: None   Collection Time: 08/24/17  6:50 PM  Result Value Ref Range Status   Specimen Description URINE, RANDOM  Final   Special Requests NONE  Final   Culture   Final    NO GROWTH Performed at [name], [address] [place], [place] [address]    Report Status 08/25/2017 FINAL  Final  MRSA PCR Screening     Status: Abnormal   Collection Time: 08/24/17  9:26 PM  Result Value Ref Range Status   MRSA by PCR POSITIVE (A) NEGATIVE Final    Comment:        The GeneXpert MRSA Assay (FDA approved for NASAL specimens only), is one component of a  comprehensive MRSA colonization surveillance program. It is not intended to diagnose MRSA infection nor to guide or monitor treatment for MRSA infections. RESULT CALLED TO, READ BACK BY AND VERIFIED WITH: ROMESBERG,K RN 0125 08/25/17 MITCHELL,L Performed at [facility], [address] [place], [place] [address]          Radiology Studies: No results found.      Scheduled Meds: . docusate sodium  100 mg Oral BID  . enoxaparin (LOVENOX) injection  40 mg Subcutaneous Q24H  . insulin aspart  0-15 Units Subcutaneous TID WC  . insulin aspart  0-5 Units Subcutaneous QHS  . insulin aspart  10 Units Subcutaneous TID WC  . insulin glargine  25 Units Subcutaneous BID  . sodium chloride flush  3 mL Intravenous Q12H   Continuous Infusions: . sodium chloride    . lactated ringers 10 mL/hr at 08/27/17 2100  . methocarbamol (ROBAXIN)  IV    . sodium chloride       LOS: 7 days        Glade LloydKshitiz Harald Quevedo, MD [facility] Pager [phone]  If 7PM-7AM, please contact night-coverage [email or web address] Password [email or web address] 08/31/2017, 10:17 AM

## 2017-08-31 NOTE — Progress Notes (Signed)
Patient blood pressure elevated to 172/105 and loss of IV access, patient had PRN blood pressure medication IV.  Paged physician on call to change IV to po.  Will continue to monitor.

## 2017-09-01 LAB — GLUCOSE, CAPILLARY
Glucose-Capillary: 84 mg/dL (ref 65–99)
Glucose-Capillary: 171 mg/dL — ABNORMAL HIGH (ref 65–99)
Glucose-Capillary: 100 mg/dL — ABNORMAL HIGH (ref 65–99)
Glucose-Capillary: 110 mg/dL — ABNORMAL HIGH (ref 65–99)

## 2017-09-01 MED ORDER — AMLODIPINE BESYLATE 5 MG PO TABS
5.0000 mg | ORAL_TABLET | Freq: Every day | ORAL | Status: DC
  Administered 2017-09-01 – 2017-09-03 (×3): 5 mg via ORAL
  Filled 2017-09-01 (×3): qty 1

## 2017-09-01 NOTE — Evaluation (Signed)
Occupational Therapy Evaluation Patient Details Name: Christopher Callahan MRN: [id number] DOB: 1991/03/18 Today's Date: 09/01/2017    History of Present Illness 27 yo male with onset of osteomyelitis, abscess, low K+, leukocytosis, and failed ABT was admitted to receive R BKA on 08/27/17.  Pt with significant PMH of DM type 1 and skin graft on his left leg.    Clinical Impression   Pt was independent prior to admission. Presents with generalized weakness and impaired standing balance requiring min assist for OOB mobility and ADL. Pt has excellent potential to function at a modified independent level with intensive rehab and is highly motivated. Recommending CIR. Will follow acutely.    Follow Up Recommendations  CIR    Equipment Recommendations  3 in 1 bedside commode;Tub/shower bench    Recommendations for Other Services       Precautions / Restrictions Precautions Precautions: Fall Restrictions Weight Bearing Restrictions: No      Mobility Bed Mobility Overal bed mobility: Modified Independent             General bed mobility comments: HOB up  Transfers Overall transfer level: Needs assistance Equipment used: Rolling walker (2 wheeled) Transfers: Sit to/from [employer]Stand;Stand Pivot Transfers Sit to Stand: Min guard;Min assist;From elevated surface Stand pivot transfers: Min assist       General transfer comment: min assist for stability    Balance Overall balance assessment: Needs assistance Sitting-balance support: Feet supported Sitting balance-Leahy Scale: Good     Standing balance support: Bilateral upper extremity supported Standing balance-Leahy Scale: Poor Standing balance comment: needs B UE support                           ADL either performed or assessed with clinical judgement   ADL Overall ADL's : Needs assistance/impaired Eating/Feeding: Independent;Bed level   Grooming: Wash/dry hands;Wash/dry face;Oral care;Sitting;Set up   Upper  Body Bathing: Set up;Sitting   Lower Body Bathing: Minimal assistance;Sitting/lateral leans   Upper Body Dressing : Set up;Sitting   Lower Body Dressing: Minimal assistance;Sitting/lateral leans   Toilet Transfer: Minimal assistance;Stand-pivot;RW   Toileting- Clothing Manipulation and Hygiene: Minimal assistance;Sitting/lateral lean         General ADL Comments: Educated pt in compensatory strategies for ADL.     Vision Baseline Vision/History: No visual deficits       Perception     Praxis      Pertinent Vitals/Pain Pain Assessment: Faces Faces Pain Scale: Hurts a little bit Pain Location: R LE Pain Descriptors / Indicators: Operative site guarding Pain Intervention(s): Monitored during session;Repositioned     Hand Dominance Right   Extremity/Trunk Assessment Upper Extremity Assessment Upper Extremity Assessment: RUE deficits/detail;LUE deficits/detail RUE Deficits / Details: 4+/5 LUE Deficits / Details: 4+/5   Lower Extremity Assessment Lower Extremity Assessment: Defer to PT evaluation RLE Deficits / Details: new R BK amputation   Cervical / Trunk Assessment Cervical / Trunk Assessment: Normal   Communication Communication Communication: No difficulties   Cognition Arousal/Alertness: Awake/alert Behavior During Therapy: WFL for tasks assessed/performed Overall Cognitive Status: Within Functional Limits for tasks assessed                                     General Comments       Exercises     Shoulder Instructions      Home Living Family/patient expects to be discharged to:: Inpatient rehab  Living Arrangements: Parent Available Help at Discharge: Family;Available 24 hours/day(mom works from home) Type of Home: Apartment Home Access: Stairs to enter [employer] of Steps: 17 Entrance Stairs-Rails: Right;Left Home Layout: One level     Bathroom Shower/Tub: Chief Strategy Officer: Standard     Home  Equipment: None          Prior Functioning/Environment Level of Independence: Independent        Comments: lives with his mother        OT Problem List: Decreased strength;Impaired balance (sitting and/or standing);Decreased knowledge of use of DME or AE;Pain      OT Treatment/Interventions: Self-care/ADL training;Therapeutic exercise;DME and/or AE instruction;Patient/family education;Balance training    OT Goals(Current goals can be found in the care plan section) Acute Rehab OT Goals Patient Stated Goal: to get a prosthesis OT Goal Formulation: With patient Time For Goal Achievement: 09/15/17 Potential to Achieve Goals: Good ADL Goals Pt Will Perform Grooming: with modified independence;sitting Pt Will Perform Lower Body Bathing: with modified independence;sitting/lateral leans Pt Will Perform Lower Body Dressing: with modified independence;sitting/lateral leans Pt Will Transfer to Toilet: with modified independence;ambulating;bedside commode(over toilet) Pt Will Perform Toileting - Clothing Manipulation and hygiene: with modified independence;sitting/lateral leans Pt Will Perform Tub/Shower Transfer: Tub transfer;with modified independence;ambulating;tub bench;rolling walker Pt/caregiver will Perform Home Exercise Program: Increased strength;Both right and left upper extremity;With theraband(level 3)  OT Frequency: Min 3X/week   Barriers to D/C:            Co-evaluation              AM-PAC PT "6 Clicks" Daily Activity     Outcome Measure Help from another person eating meals?: None Help from another person taking care of personal grooming?: A Little Help from another person toileting, which includes using toliet, bedpan, or urinal?: A Little Help from another person bathing (including washing, rinsing, drying)?: A Little Help from another person to put on and taking off regular upper body clothing?: None Help from another person to put on and taking off  regular lower body clothing?: A Little 6 Click Score: 20   End of Session Equipment Utilized During Treatment: Gait belt;Rolling walker  Activity Tolerance: Patient tolerated treatment well Patient left: in bed;with call bell/phone within reach  OT Visit Diagnosis: Other abnormalities of gait and mobility (R26.89);Pain;Muscle weakness (generalized) (M62.81)                Time: 4098-1191 OT Time Calculation (min): 18 min Charges:  OT General Charges $OT Visit: 1 Visit OT Evaluation $OT Eval Moderate Complexity: 1 Mod G-Codes:     10-Sep-2017 Martie Round, OTR/L Pager: [phone]  Iran Planas, Christopher Callahan 09-10-2017, 11:06 AM

## 2017-09-01 NOTE — Progress Notes (Signed)
Rehab admissions - I have faxed the OT eval to [employer].  Awaiting response from insurance case manager regarding possible inpatient rehab admission.  #[phone]

## 2017-09-01 NOTE — Progress Notes (Signed)
Rehab admissions - I have called and opened the case with [employer] requesting acute inpatient rehab admission.  We will need an OT evaluation and we will need updated PT progress.  I have called the therapy department to let them know of our needs.  [facility] and will await [facility].  Call me for questions.  #[phone]

## 2017-09-01 NOTE — Progress Notes (Signed)
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  Pt with significant PMH of DM type 1 and skin graft on his left leg.     PT Comments    Patient progressing with ambulation distance and endurance, but feel can improve with better technique and walker adjustment.  Patient remains appropriate for inpatient rehab to maximize mobility, safety and independence prior to d/c home.    Follow Up Recommendations  CIR     Equipment Recommendations  Rolling walker with 5" wheels;Wheelchair (measurements PT);Wheelchair cushion (measurements PT);Other (comment);3in1 (PT)(tub transfer bench, R amputee pad)    Recommendations for Other Services       Precautions / Restrictions Precautions Precautions: Fall Precaution Comments: wound vac, R BKA Required Braces or Orthoses: Other Brace/Splint Other Brace/Splint: shrinker Restrictions Weight Bearing Restrictions: No    Mobility  Bed Mobility Overal bed mobility: Modified Independent             General bed mobility comments: HOB up  Transfers Overall transfer level: Needs assistance Equipment used: Rolling walker (2 wheeled) Transfers: Sit to/from Stand Sit to Stand: Min assist Stand pivot transfers: Min assist       General transfer comment: min a for balance  Ambulation/Gait Ambulation/Gait assistance: Min assist Ambulation Distance (Feet): 20 Feet(three standing rest breaks) Assistive device: Rolling walker (2 wheeled) Gait Pattern/deviations: Step-to pattern;Trunk flexed;Shuffle;Decreased stride length     General Gait Details: Min A for balance, safety, pt hops with minimal UE weight bearing, demonstrated/educated after walking on leaning into arms with elbow extension for improved  mobility/less energy expenditure and decreased stress on L LE   Stairs            Wheelchair Mobility    Modified Rankin (Stroke Patients Only)       Balance Overall balance assessment: Needs assistance Sitting-balance support: Feet supported Sitting balance-Leahy Scale: Good     Standing balance support: Bilateral upper extremity supported Standing balance-Leahy Scale: Poor Standing balance comment: UE suppor due to BKA RLE                            Cognition Arousal/Alertness: Awake/alert Behavior During Therapy: WFL for tasks assessed/performed Overall Cognitive Status: Within Functional Limits for tasks assessed                                        Exercises Amputee Exercises Quad Sets: 10 reps;Right;Supine;Strengthening Hip ABduction/ADduction: 10 reps;Strengthening;Right;Sidelying Other Exercises Other Exercises: sidelying hip extension on R x 10    General Comments        Pertinent Vitals/Pain Pain Assessment: Faces Pain Score: 4  Faces Pain Scale: Hurts a little bit Pain Location: R LE Pain Descriptors / Indicators: Sore;Tender Pain Intervention(s): Monitored during session;Repositioned    Home Living Family/patient expects to be discharged to:: Inpatient rehab Living Arrangements: Parent Available Help at Discharge: Family;Available 24 hours/day(mom works from home) Type of Home: Apartment Home Access: Stairs to enter Entrance Stairs-Rails: Right;Left Home Layout: One level Home Equipment: None      Prior Function Level of Independence: Independent      Comments: lives with his mother   PT Goals (current  goals can now be found in the care plan section) Acute Rehab PT Goals Patient Stated Goal: to get a prosthesis Progress towards PT goals: Progressing toward goals    Frequency    Min 3X/week      PT Plan Current plan remains appropriate    Co-evaluation              AM-PAC PT "6 Clicks"  Daily Activity  Outcome Measure  Difficulty turning over in bed (including adjusting bedclothes, sheets and blankets)?: None Difficulty moving from lying on back to sitting on the side of the bed? : A Little Difficulty sitting down on and standing up from a chair with arms (e.g., wheelchair, bedside commode, etc,.)?: Unable Help needed moving to and from a bed to chair (including a wheelchair)?: A Little Help needed walking in hospital room?: A Little Help needed climbing 3-5 steps with a railing? : Total 6 Click Score: 15    End of Session Equipment Utilized During Treatment: Gait belt Activity Tolerance: Patient limited by fatigue Patient left: in bed;with call bell/phone within reach   PT Visit Diagnosis: Unsteadiness on feet (R26.81);Muscle weakness (generalized) (M62.81);Difficulty in walking, not elsewhere classified (R26.2);Pain Pain - Right/Left: Right Pain - part of body: Leg     Time: [id number] PT Time Calculation (min) (ACUTE ONLY): 24 min  Charges:  $Gait Training: 8-22 mins $Therapeutic Exercise: 8-22 mins                    G CodesSheran Callahan:       Christopher Callahan, [place]PT [phone] 09/01/2017    Christopher Callahan 09/01/2017, 1:09 PM

## 2017-09-01 NOTE — Progress Notes (Signed)
Patient ID: Christopher Callahan, male   DOB: 06/14/1990, 27 y.o.   MRN: [id number]  PROGRESS NOTE    Christopher Callahan  WUJ:[id number] DOB: 1990-06-09 DOA: 08/24/2017 PCP: Jacquelynn Cree, PA-C   Brief Narrative: 27 year old male with history of diabetes mellitus type 1 complicated by diabetic neuropathy of bilateral feet, diabetic foot wound, questionable gastroparesis and ongoing tobacco use presented with worsening right foot wound.  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  He was started on broad-spectrum antibiotics and orthopedics was consulted.  He had amputation on 08/27/2017.  P T recommended CIR placement.  He thought that he would manage at home and was supposed to be discharged home with home health on 08/29/2017 but PT again recommended CIR.   Assessment & Plan:   Active Problems:   Severe sepsis (HCC)   Diabetic foot infection (HCC)   Sepsis (HCC)   Subacute osteomyelitis, right ankle and foot (HCC)  Severe sepsis probably due to right foot osteomyelitis -Resolved.  Off antibiotics.  Currently afebrile hemodynamically stable.  Cultures negative so far.   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  Currently has a wound VAC as per orthopedics.  Wound care and wound VAC care as per orthopedics recommendations. -PT eval -Off antibiotics after amputation -Cultures negative so far  Leukocytosis -Probably secondary to above.  Resolved  Lactic acidosis -Secondary to above.  Resolved   Diabetes mellitus type 1 uncontrolled with hyperglycemia -Hemoglobin A1c is 11.1.   -Blood sugars much better.  Continue current regimen with Lantus and NovoLog along with sliding scale insulin.  Monitor for hypoglycemia  Hyponatremia -Probably secondary to hyperglycemia and dehydration -Resolved.  Ongoing tobacco use -Patient was counseled regarding cessation  Obesity -Outpatient  follow-up  Hypokalemia -Resolved.    DVT prophylaxis:  Lovenox Code Status: Full Family Communication: None at bedside Disposition Plan: CIR once bed is available  Consultants: Orthopedics  Procedures: None  Antimicrobials: Vancomycin 08/24/2017-08/28/2017 Zosyn from 08/24/2017-08/27/2017   Subjective: Patient seen and examined at bedside.  No overnight fever, nausea, vomiting, worsening pain.    Objective: Vitals:   08/31/17 1456 08/31/17 2157 08/31/17 2240 09/01/17 0556  BP: (!) 159/98 (!) 167/107 (!) 172/105 (!) 152/92  Pulse: (!) 108 (!) 105 (!) 102 98  Resp: 17 18  20   Temp: 97.9 F (36.6 C) 98.1 F (36.7 C)  98.7 F (37.1 C)  TempSrc: Oral     SpO2: 97% 98%  100%  Weight:      Height:        Intake/Output Summary (Last 24 hours) at 09/01/2017 1505 Last data filed at 09/01/2017 0645 Gross per 24 hour  Intake -  Output 3550 ml  Net -3550 ml   Filed Weights   08/24/17 1606 08/27/17 1047  Weight: 106.6 kg (235 lb) 106.6 kg (235 lb)    Examination:  General exam: No acute distress. Extremities: Right BKA with wound VAC present   Data Reviewed: I have personally reviewed following labs and imaging studies  CBC: Recent Labs  Lab 08/26/17 0542 08/27/17 0604 08/28/17 0420 08/29/17 0640 08/31/17 0355  WBC 19.5* 21.1* 13.2* 9.5 10.6*  NEUTROABS 15.7* 17.3* 11.4* 6.0 7.4  HGB 10.3* 11.2* 11.2* 10.6* 10.5*  HCT 30.0* 32.4* 33.2* 32.0* 32.3*  MCV 80.2 81.2 82.6 82.5 84.1  PLT 275 319 362 389 444*   Basic Metabolic Panel: Recent Labs  Lab 08/26/17 0542 08/27/17 0604 08/28/17 0420 08/29/17 0640 08/31/17 0355  NA 132*  133* 129* 137 139  K 3.3* 3.9 4.5 4.3 4.3  CL 100* 98* 97* 103 105  CO2 22 24 22 26 26   GLUCOSE 211* 190* 490* 265* 93  BUN 12 11 20  22* 14  CREATININE 1.02 1.01 1.23 1.33* 1.07  CALCIUM 7.4* 8.0* 7.5* 7.9* 8.0*  MG 1.8 2.0 2.2 2.0 2.0   GFR: Estimated Creatinine Clearance: 135.2 mL/min (by C-G formula based on SCr of 1.07  mg/dL). Liver Function Tests: No results for input(s): AST, ALT, ALKPHOS, BILITOT, PROT, ALBUMIN in the last 168 hours. No results for input(s): LIPASE, AMYLASE in the last 168 hours. No results for input(s): AMMONIA in the last 168 hours. Coagulation Profile: No results for input(s): INR, PROTIME in the last 168 hours. Cardiac Enzymes: No results for input(s): CKTOTAL, CKMB, CKMBINDEX, TROPONINI in the last 168 hours. BNP (last 3 results) No results for input(s): PROBNP in the last 8760 hours. HbA1C: No results for input(s): HGBA1C in the last 72 hours. CBG: Recent Labs  Lab 08/31/17 1240 08/31/17 1757 08/31/17 2154 09/01/17 0804 09/01/17 1301  GLUCAP 106* 159* 92 84 171*   Lipid Profile: No results for input(s): CHOL, HDL, LDLCALC, TRIG, CHOLHDL, LDLDIRECT in the last 72 hours. Thyroid Function Tests: No results for input(s): TSH, T4TOTAL, FREET4, T3FREE, THYROIDAB in the last 72 hours. Anemia Panel: No results for input(s): VITAMINB12, FOLATE, FERRITIN, TIBC, IRON, RETICCTPCT in the last 72 hours. Sepsis Labs: No results for input(s): PROCALCITON, LATICACIDVEN in the last 168 hours.  Recent Results (from the past 240 hour(s))  Blood culture (routine x 2)     Status: None   Collection Time: 08/24/17  4:15 PM  Result Value Ref Range Status   Specimen Description BLOOD LEFT ANTECUBITAL  Final   Special Requests AEROBIC BOTTLE ONLY Blood Culture adequate volume  Final   Culture   Final    NO GROWTH 5 DAYS Performed at [facility], [address], [place], [place] [address]    Report Status 08/29/2017 FINAL  Final  Blood culture (routine x 2)     Status: None   Collection Time: 08/24/17  5:19 PM  Result Value Ref Range Status   Specimen Description BLOOD RIGHT ANTECUBITAL  Final   Special Requests   Final    BOTTLES DRAWN AEROBIC AND ANAEROBIC Blood Culture adequate volume   Culture   Final    NO GROWTH 5 DAYS Performed at [facility], [address] [place], [place] [address]    Report Status 08/29/2017 FINAL  Final  Urine culture     Status: None   Collection Time: 08/24/17  6:50 PM  Result Value Ref Range Status   Specimen Description URINE, RANDOM  Final   Special Requests NONE  Final   Culture   Final    NO GROWTH Performed at [facility], [address] [place], [place] [address]    Report Status 08/25/2017 FINAL  Final  MRSA PCR Screening     Status: Abnormal   Collection Time: 08/24/17  9:26 PM  Result Value Ref Range Status   MRSA by PCR POSITIVE (A) NEGATIVE Final    Comment:        The GeneXpert MRSA Assay (FDA approved for NASAL specimens only), is one component of a comprehensive MRSA colonization surveillance program. It is not intended to diagnose MRSA infection nor to guide or monitor treatment for MRSA infections. RESULT CALLED TO, READ BACK BY AND VERIFIED WITH:  ROMESBERG,K RN [phone] 08/25/17 MITCHELL,L Performed at [facility], [address] [place], [place] [address]          Radiology Studies: No results found.      Scheduled Meds: . amLODipine  5 mg Oral Daily  . docusate sodium  100 mg Oral BID  . enoxaparin (LOVENOX) injection  40 mg Subcutaneous Q24H  . feeding supplement (GLUCERNA SHAKE)  237 mL Oral TID BM  . insulin aspart  0-15 Units Subcutaneous TID WC  . insulin aspart  0-5 Units Subcutaneous QHS  . insulin aspart  10 Units Subcutaneous TID WC  . insulin glargine  25 Units Subcutaneous BID  . sodium chloride flush  3 mL Intravenous Q12H   Continuous Infusions: . sodium chloride    . lactated ringers 10 mL/hr at 08/27/17 2100  . methocarbamol (ROBAXIN)  IV    . sodium chloride       LOS: 8 days        Glade LloydKshitiz Melisa Donofrio, MD [facility] Pager [phone]  If 7PM-7AM, please contact night-coverage [email or web address] Password [email or web address] 09/01/2017, 3:05 PM

## 2017-09-01 NOTE — PMR Pre-admission (Signed)
PMR Admission Coordinator Pre-Admission Assessment  [name] is an 27 y.o., male MRN: [id number] DOB: April 26, 1991 Height: _0  (185.4 cm) Weight: 106.6 kg (235 lb)           Insurance Information HMO:     PPO: Yes     PCP:       IPA:       80/20:       OTHER:   PRIMARY: BCBS fed emp PPO      Policy#:  [id number]      Subscriber: Cecile Hearing - patient's father CM Name: A My      Phone#: [phone]     Fax#: [phone] Pre-Cert#: [id number] With update on 09/08/17    Employer:  Father works FT Benefits:  Phone #: [phone]     Name:  On line Eff. Date: 09/30/15     Deduct: $5500 (met $458.19)      Out of Pocket Max: $5500 (met $458.19)      Life Max: N/A CIR: $175 per day up to $875 per admission      SNF: No benefits Outpatient: 50 combined visits     Co-Pay: $30/visit Home Health: 25 visits per year      Co-Pay: $30/visit DME: 70%     Co-Pay: 30% Providers: in network  Emergency Contact Information Contact Information    Name Relation Home Work [place] Mother [phone]     Valerie Salts [phone]       Current Medical History  Patient Admitting Diagnosis:  R BKA  History of Present Illness: A 27 y.o. right-handedmalewith history of T1DM with neuropathy, tobacco use, chronic diabetic foot ulcers with worsening despite antibiotics.Per chart review lives with his mother.  Indepedent prior to admission. He was admitted on 08/24/17 with increase drainage, malaise with aches and pain. Patient reports taking medications x weeks and inability to manage (not acting right) at home therefore moved by family to [facility].He was started on broad spectrumantibiotics for sepsis due to right foot osteomyelitis with abscess and IV insulin due to BS 856 as well as fluids for AKI. Dr. 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 Post op continues to have poorly controlled BS. Hyponatremia and leucocytosis have resolved.  Acute blood  loss anemia 10.5 and monitored.  SQ Lovenox for DVT prophylaxis.  MRSA PCR screening positive with contact precautions.Therapy evaluation done and CIR recommended due to functional deficits.  Patient wto be admitted for a comprehensive inpatient rehab program.  Past Medical History  Past Medical History:  Diagnosis Date  . Diabetes mellitus without complication (HCC)     Family History  family history includes COPD in his father.  Prior Rehab/Hospitalizations: No previous rehab.  Has the patient had major surgery during 100 days prior to admission? No  Current Medications   Current Facility-Administered Medications:  .  0.9 %  sodium chloride infusion, , Intravenous, Continuous, Newt Minion, MD .  acetaminophen (TYLENOL) tablet 325-650 mg, 325-650 mg, Oral, Q6H PRN, Newt Minion, MD, 650 mg at 08/29/17 0123 .  amLODipine (NORVASC) tablet 5 mg, 5 mg, Oral, Daily, Starla Link, Kshitiz, MD, 5 mg at 09/03/17 0833 .  bisacodyl (DULCOLAX) suppository 10 mg, 10 mg, Rectal, Daily PRN, Newt Minion, MD, 10 mg at 08/29/17 1939 .  docusate sodium (COLACE) capsule 100 mg, 100 mg, Oral, BID, Newt Minion, MD, 100 mg at 09/03/17 2800 .  enoxaparin (LOVENOX) injection 40 mg, 40 mg, Subcutaneous,  Q24H, Newt Minion, MD, 40 mg at 09/02/17 1500 .  feeding supplement (GLUCERNA SHAKE) (GLUCERNA SHAKE) liquid 237 mL, 237 mL, Oral, TID BM, Alekh, Kshitiz, MD, 237 mL at 09/03/17 1305 .  hydrALAZINE (APRESOLINE) injection 5 mg, 5 mg, Intravenous, Q4H PRN, Newt Minion, MD, 5 mg at 08/26/17 1721 .  HYDROmorphone (DILAUDID) injection 0.5-1 mg, 0.5-1 mg, Intravenous, Q4H PRN, Newt Minion, MD, 1 mg at 08/29/17 2201 .  insulin aspart (novoLOG) injection 0-15 Units, 0-15 Units, Subcutaneous, TID WC, Newt Minion, MD, 2 Units at 09/03/17 [phone] .  insulin aspart (novoLOG) injection 0-5 Units, 0-5 Units, Subcutaneous, QHS, Newt Minion, MD, 3 Units at 08/28/17 2213 .  insulin aspart (novoLOG) injection 10  Units, 10 Units, Subcutaneous, TID WC, Alekh, Kshitiz, MD, 10 Units at 09/03/17 1304 .  insulin glargine (LANTUS) injection 25 Units, 25 Units, Subcutaneous, BID, Aline August, MD, 25 Units at 09/03/17 0835 .  lactated ringers infusion, , Intravenous, Continuous, Newt Minion, MD, Last Rate: 10 mL/hr at 08/27/17 2100 .  methocarbamol (ROBAXIN) tablet 500 mg, 500 mg, Oral, Q6H PRN, 500 mg at 08/29/17 1929 **OR** methocarbamol (ROBAXIN) 500 mg in dextrose 5 % 50 mL IVPB, 500 mg, Intravenous, Q6H PRN, Newt Minion, MD .  metoCLOPramide (REGLAN) tablet 5-10 mg, 5-10 mg, Oral, Q8H PRN **OR** metoCLOPramide (REGLAN) injection 5-10 mg, 5-10 mg, Intravenous, Q8H PRN, Newt Minion, MD .  metoprolol tartrate (LOPRESSOR) injection 2.5 mg, 2.5 mg, Intravenous, Q4H PRN, Newt Minion, MD, 2.5 mg at 08/26/17 1545 .  ondansetron (ZOFRAN) tablet 4 mg, 4 mg, Oral, Q6H PRN **OR** ondansetron (ZOFRAN) injection 4 mg, 4 mg, Intravenous, Q6H PRN, Newt Minion, MD .  oxyCODONE (Oxy IR/ROXICODONE) immediate release tablet 10-15 mg, 10-15 mg, Oral, Q4H PRN, Newt Minion, MD, 15 mg at 09/03/17 1304 .  oxyCODONE (Oxy IR/ROXICODONE) immediate release tablet 5-10 mg, 5-10 mg, Oral, Q4H PRN, Newt Minion, MD, 10 mg at 08/27/17 1407 .  polyethylene glycol (MIRALAX / GLYCOLAX) packet 17 g, 17 g, Oral, Daily PRN, Newt Minion, MD .  sodium chloride 0.9 % bolus 1,000 mL, 1,000 mL, Intravenous, Once, Newt Minion, MD .  sodium chloride flush (NS) 0.9 % injection 3 mL, 3 mL, Intravenous, Q12H, Newt Minion, MD, 3 mL at 08/31/17 0929 .  zolpidem (AMBIEN) tablet 5 mg, 5 mg, Oral, QHS PRN, Starla Link, Kshitiz, MD, 5 mg at 09/02/17 2310  Patients Current Diet: Diet Carb Modified Fluid consistency: Thin; Room service appropriate? Yes Diet - low sodium heart healthy Diet Carb Modified  Precautions / Restrictions Precautions Precautions: Fall Precaution Comments: wound vac, R BKA Other Brace/Splint: splint for R  BK Restrictions Weight Bearing Restrictions: Yes   Has the patient had 2 or more falls or a fall with injury in the past year?No  Prior Activity Level Community (5-7x/wk): Worked FT as a bar tender, was driving.  Home Assistive Devices / Equipment Home Equipment: None  Prior Device Use: Indicate devices/aids used by the patient prior to current illness, exacerbation or injury? None  Prior Functional Level Prior Function Level of Independence: Independent Comments: lives with his mother  Self Care: Did the patient need help bathing, dressing, using the toilet or eating?  Independent  Indoor Mobility: Did the patient need assistance with walking from room to room (with or without device)? Independent  Stairs: Did the patient need assistance with internal or external stairs (with or without device)? Independent  Functional  Cognition: Did the patient need help planning regular tasks such as shopping or remembering to take medications? Independent  Current Functional Level Cognition  Overall Cognitive Status: Within Functional Limits for tasks assessed Orientation Level: Oriented X4 General Comments: calmer mood and was talking with family when PT arrived    Extremity Assessment (includes Sensation/Coordination)  Upper Extremity Assessment: RUE deficits/detail, LUE deficits/detail RUE Deficits / Details: 4+/5 LUE Deficits / Details: 4+/5  Lower Extremity Assessment: Defer to PT evaluation RLE Deficits / Details: new R BK amputation RLE Coordination: decreased fine motor, decreased gross motor    ADLs  Overall ADL's : Needs assistance/impaired Eating/Feeding: Independent, Bed level Grooming: Wash/dry hands, Wash/dry face, Oral care, Sitting, Set up Upper Body Bathing: Set up, Sitting Lower Body Bathing: Minimal assistance, Sitting/lateral leans Upper Body Dressing : Set up, Sitting Lower Body Dressing: Minimal assistance, Sitting/lateral leans Toilet Transfer: Minimal  assistance, Stand-pivot, RW Toileting- Clothing Manipulation and Hygiene: Minimal assistance, Sitting/lateral lean General ADL Comments: Educated pt in compensatory strategies for ADL.    Mobility  Overal bed mobility: Modified Independent General bed mobility comments: HOB up    Transfers  Overall transfer level: Needs assistance Equipment used: Rolling walker (2 wheeled), 1 person hand held assist Transfers: Sit to/from Stand Sit to Stand: Min guard, Min assist Stand pivot transfers: Min guard General transfer comment: minor help to steady with initial standing    Ambulation / Gait / Stairs / Wheelchair Mobility  Ambulation/Gait Ambulation/Gait assistance: Physicist, medical (Feet): 45 Feet Assistive device: Rolling walker (2 wheeled) Gait Pattern/deviations: Step-to pattern, Trunk flexed, Shuffle, Decreased stride length General Gait Details: improved heel to toe on LLE, smoother and able to maneuver walker around obstacles in his room Gait velocity: reduced Gait velocity interpretation: Below normal speed for age/gender    Posture / Balance Balance Overall balance assessment: Needs assistance Sitting-balance support: Feet supported Sitting balance-Leahy Scale: Good Standing balance support: Bilateral upper extremity supported, During functional activity Standing balance-Leahy Scale: Poor Standing balance comment: UE suppor due to BKA RLE    Special needs/care consideration BiPAP/CPAP No CPM No Continuous Drip IV KVO Dialysis No       Life Vest No Oxygen No Special Bed No Trach Size No Wound Vac (area) Yes, post op right BKA site with VAC in place     Skin New post op Right BKA site with VAC in place                            Bowel mgmt: No BM since 08/24/17 Bladder mgmt: Voiding in urinal Diabetic mgmt Yes, on insulin at home    Previous Home Environment Living Arrangements: Parent Available Help at Discharge: Family, Available 24 hours/day(mom works  from home) Type of Home: Apartment Home Layout: One level Home Access: Stairs to enter Entrance Stairs-Rails: Right, Left Entrance Stairs-Number of Steps: 17 Bathroom Shower/Tub: Chiropodist: Standard  Discharge Living Setting Plans for Discharge Living Setting: Lives with (comment), Apartment(Lives with his mother.) Type of Home at Discharge: Apartment(2nd floor apartment) Discharge Home Layout: One level(apartment is on the 2nd level.) Discharge Home Access: Stairs to enter Entrance Stairs-Number of Steps: 10-12 steps Does the patient have any problems obtaining your medications?: No  Social/Family/Support Systems Patient Roles: Other (Comment)(Has a mother.) Contact Information: Augustin Schooling - mother - [phone] Anticipated Caregiver: Mom can assist, she works from home Ability/Limitations of Caregiver: Mom works from home and can assist Caregiver Availability:  24/7 Discharge Plan Discussed with Primary Caregiver: Yes Is Caregiver In Agreement with Plan?: Yes Does Caregiver/Family have Issues with Lodging/Transportation while Pt is in Rehab?: No  Goals/Additional Needs Patient/Family Goal for Rehab: PT/OT mod I goals Expected length of stay: 7-10 days Cultural Considerations: None Dietary Needs: Carb mod, med calorie, thin liquids Equipment Needs: TBD Pt/Family Agrees to Admission and willing to participate: Yes Program Orientation Provided & Reviewed with Pt/Caregiver Including Roles  & Responsibilities: Yes  Decrease burden of Care through IP rehab admission: N/A  Possible need for SNF placement upon discharge: Not planned  Patient Condition: This patient's medical and functional status has changed since the consult dated: 08/29/17 in which the Rehabilitation Physician determined and documented that the patient's condition is appropriate for intensive rehabilitative care in an inpatient rehabilitation facility. See "History of Present Illness"  (above) for medical update. Functional changes are: Currently requiring min guard assist for transfers and min guard assist to ambulate 45 feet RW. Patient's medical and functional status update has been discussed with the Rehabilitation physician and patient remains appropriate for inpatient rehabilitation. Will admit to inpatient rehab today.  Preadmission Screen Completed By:  Retta Diones, 09/03/2017 4:51 PM ______________________________________________________________________   Discussed status with Dr. Posey Pronto on 09/03/17 at 1648 and received telephone approval for admission today.  Admission Coordinator:  Retta Diones, time 1648/Date 09/03/17

## 2017-09-02 LAB — GLUCOSE, CAPILLARY
GLUCOSE-CAPILLARY: 112 mg/dL — AB (ref 65–99)
Glucose-Capillary: 92 mg/dL (ref 65–99)
Glucose-Capillary: 130 mg/dL — ABNORMAL HIGH (ref 65–99)
Glucose-Capillary: 124 mg/dL — ABNORMAL HIGH (ref 65–99)

## 2017-09-02 MED ORDER — GLUCERNA SHAKE PO LIQD: 237.0000 mL | Freq: Three times a day (TID) | ORAL | 0 refills | Status: DC

## 2017-09-02 MED ORDER — AMLODIPINE BESYLATE 5 MG PO TABS: 5.0000 mg | ORAL_TABLET | Freq: Every day | ORAL | Status: DC

## 2017-09-02 MED ORDER — OXYCODONE HCL 5 MG PO TABS: 5.0000 mg | ORAL_TABLET | Freq: Four times a day (QID) | ORAL | Status: DC | PRN

## 2017-09-02 NOTE — Progress Notes (Addendum)
Patient ID: Christopher Macentonio Mitro, male   DOB: 1990-12-09, 27 y.o.   MRN: [id number]  PROGRESS NOTE    Christopher Callahan  WUJ:[id number] DOB: 1990-12-09 DOA: 08/24/2017 PCP: Jacquelynn Creeoleman, Angela, PA-C   Brief Narrative: 27 year old male with history of diabetes mellitus type 1 complicated by diabetic neuropathy of bilateral feet, diabetic foot wound, questionable gastroparesis and ongoing tobacco use presented with worsening right foot wound.  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  He was started on broad-spectrum antibiotics and orthopedics was consulted.  He had amputation on 08/27/2017.  P T recommended CIR placement.  He thought that he would manage at home and was supposed to be discharged home with home health on 08/29/2017 but PT again recommended CIR. Please refer the full discharge summary done by me on 08/29/2017 for full details.   Assessment & Plan:   Active Problems:   Severe sepsis (HCC)   Diabetic foot infection (HCC)   Sepsis (HCC)   Subacute osteomyelitis, right ankle and foot (HCC)  Severe sepsis probably due to right foot osteomyelitis -Resolved.  Off antibiotics.  Currently afebrile hemodynamically stable.  Cultures negative so far.   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  Currently has a wound VAC as per orthopedics.  Wound care and wound VAC care as per orthopedics recommendations. -PT eval -Off antibiotics after amputation -Cultures negative so far  Leukocytosis -Probably secondary to above.  Resolved  Lactic acidosis -Secondary to above.  Resolved   Diabetes mellitus type 1 uncontrolled with hyperglycemia -Hemoglobin A1c is 11.1.   -Blood sugars much better.  Continue current regimen with Lantus and NovoLog along with sliding scale insulin.  Monitor for hypoglycemia  Hyponatremia -Probably secondary to hyperglycemia and dehydration -Resolved.  Ongoing tobacco use -Patient was  counseled regarding cessation  Obesity -Outpatient follow-up  Hypokalemia -Resolved.    Hypertension -Amlodipine was started during the hospitalization which is being continued.  Pressure on the higher side, monitor.  Amlodipine can be increased to 10 mg daily if needed  DVT prophylaxis:  Lovenox Code Status: Full Family Communication: None at bedside Disposition Plan: CIR once bed is available  Consultants: Orthopedics  Procedures: None  Antimicrobials: Vancomycin 08/24/2017-08/28/2017 Zosyn from 08/24/2017-08/27/2017   Subjective: Patient seen and examined at bedside.  No new complaints.  Pain seems to be controlled.  No overnight fever or vomiting.  Objective: Vitals:   08/31/17 2240 09/01/17 0556 09/01/17 2117 09/02/17 0459  BP: (!) 172/105 (!) 152/92 (!) 157/97 (!) 144/90  Pulse: (!) 102 98 (!) 105 96  Resp:  20 18 18   Temp:  98.7 F (37.1 C) 98.4 F (36.9 C) 98.1 F (36.7 C)  TempSrc:   Oral Oral  SpO2:  100% 97% 96%  Weight:      Height:        Intake/Output Summary (Last 24 hours) at 09/02/2017 1129 Last data filed at 09/02/2017 0933 Gross per 24 hour  Intake 200 ml  Output 3500 ml  Net -3300 ml   Filed Weights   08/24/17 1606 08/27/17 1047  Weight: 106.6 kg (235 lb) 106.6 kg (235 lb)    Examination:  General exam: No distress Extremities: Right BKA with wound VAC present.   Data Reviewed: I have personally reviewed following labs and imaging studies  CBC: Recent Labs  Lab 08/27/17 0604 08/28/17 0420 08/29/17 0640 08/31/17 0355  WBC 21.1* 13.2* 9.5 10.6*  NEUTROABS 17.3* 11.4* 6.0 7.4  HGB 11.2* 11.2* 10.6*  10.5*  HCT 32.4* 33.2* 32.0* 32.3*  MCV 81.2 82.6 82.5 84.1  PLT 319 362 389 444*   Basic Metabolic Panel: Recent Labs  Lab 08/27/17 0604 08/28/17 0420 08/29/17 0640 08/31/17 0355  NA 133* 129* 137 139  K 3.9 4.5 4.3 4.3  CL 98* 97* 103 105  CO2 24 22 26 26   GLUCOSE 190* 490* 265* 93  BUN 11 20 22* 14  CREATININE 1.01 1.23  1.33* 1.07  CALCIUM 8.0* 7.5* 7.9* 8.0*  MG 2.0 2.2 2.0 2.0   GFR: Estimated Creatinine Clearance: 135.2 mL/min (by C-G formula based on SCr of 1.07 mg/dL). Liver Function Tests: No results for input(s): AST, ALT, ALKPHOS, BILITOT, PROT, ALBUMIN in the last 168 hours. No results for input(s): LIPASE, AMYLASE in the last 168 hours. No results for input(s): AMMONIA in the last 168 hours. Coagulation Profile: No results for input(s): INR, PROTIME in the last 168 hours. Cardiac Enzymes: No results for input(s): CKTOTAL, CKMB, CKMBINDEX, TROPONINI in the last 168 hours. BNP (last 3 results) No results for input(s): PROBNP in the last 8760 hours. HbA1C: No results for input(s): HGBA1C in the last 72 hours. CBG: Recent Labs  Lab 09/01/17 0804 09/01/17 1301 09/01/17 1706 09/01/17 2115 09/02/17 0806  GLUCAP 84 171* 100* 110* 92   Lipid Profile: No results for input(s): CHOL, HDL, LDLCALC, TRIG, CHOLHDL, LDLDIRECT in the last 72 hours. Thyroid Function Tests: No results for input(s): TSH, T4TOTAL, FREET4, T3FREE, THYROIDAB in the last 72 hours. Anemia Panel: No results for input(s): VITAMINB12, FOLATE, FERRITIN, TIBC, IRON, RETICCTPCT in the last 72 hours. Sepsis Labs: No results for input(s): PROCALCITON, LATICACIDVEN in the last 168 hours.  Recent Results (from the past 240 hour(s))  Blood culture (routine x 2)     Status: None   Collection Time: 08/24/17  4:15 PM  Result Value Ref Range Status   Specimen Description BLOOD LEFT ANTECUBITAL  Final   Special Requests AEROBIC BOTTLE ONLY Blood Culture adequate volume  Final   Culture   Final    NO GROWTH 5 DAYS Performed at [facility], [address] [place], [place] [address]    Report Status 08/29/2017 FINAL  Final  Blood culture (routine x 2)     Status: None   Collection Time: 08/24/17  5:19 PM  Result Value Ref Range Status   Specimen Description BLOOD RIGHT ANTECUBITAL  Final   Special Requests   Final     BOTTLES DRAWN AEROBIC AND ANAEROBIC Blood Culture adequate volume   Culture   Final    NO GROWTH 5 DAYS Performed at [facility], [address], [place], [place] [address]    Report Status 08/29/2017 FINAL  Final  Urine culture     Status: None   Collection Time: 08/24/17  6:50 PM  Result Value Ref Range Status   Specimen Description URINE, RANDOM  Final   Special Requests NONE  Final   Culture   Final    NO GROWTH Performed at [facility], [address], [place], [place] [address]    Report Status 08/25/2017 FINAL  Final  MRSA PCR Screening     Status: Abnormal   Collection Time: 08/24/17  9:26 PM  Result Value Ref Range Status   MRSA by PCR POSITIVE (A) NEGATIVE Final    Comment:        The GeneXpert MRSA Assay (FDA approved for NASAL specimens only), is one component of a comprehensive  MRSA colonization surveillance program. It is not intended to diagnose MRSA infection nor to guide or monitor treatment for MRSA infections. RESULT CALLED TO, READ BACK BY AND VERIFIED WITH: ROMESBERG,K RN 0125 08/25/17 MITCHELL,L Performed at [facility], [address], [place], [place] [address]          Radiology Studies: No results found.      Scheduled Meds: . amLODipine  5 mg Oral Daily  . docusate sodium  100 mg Oral BID  . enoxaparin (LOVENOX) injection  40 mg Subcutaneous Q24H  . feeding supplement (GLUCERNA SHAKE)  237 mL Oral TID BM  . insulin aspart  0-15 Units Subcutaneous TID WC  . insulin aspart  0-5 Units Subcutaneous QHS  . insulin aspart  10 Units Subcutaneous TID WC  . insulin glargine  25 Units Subcutaneous BID  . sodium chloride flush  3 mL Intravenous Q12H   Continuous Infusions: . sodium chloride    . lactated ringers 10 mL/hr at 08/27/17 2100  . methocarbamol (ROBAXIN)  IV    . sodium chloride       LOS: 9 days        Glade Lloyd, MD [facility] Pager [phone]  If 7PM-7AM, please contact  night-coverage [email or web address] Password [facility] 09/02/2017, 11:29 AM

## 2017-09-03 ENCOUNTER — Other Ambulatory Visit: Payer: Self-pay

## 2017-09-03 ENCOUNTER — Inpatient Hospital Stay (HOSPITAL_COMMUNITY)
Admission: RE | Admit: 2017-09-03 | Payer: Federal, State, Local not specified - PPO | Source: Ambulatory Visit | Admitting: Physical Medicine & Rehabilitation

## 2017-09-03 ENCOUNTER — Encounter (HOSPITAL_COMMUNITY): Payer: Self-pay | Admitting: *Deleted

## 2017-09-03 ENCOUNTER — Inpatient Hospital Stay (HOSPITAL_COMMUNITY)
Admission: RE | Admit: 2017-09-03 | Discharge: 2017-09-11 | DRG: 560 | Disposition: A | Discharge status: Home Health | Payer: Federal, State, Local not specified - PPO | Source: Intra-hospital | Attending: Physical Medicine & Rehabilitation | Admitting: Physical Medicine & Rehabilitation

## 2017-09-03 DIAGNOSIS — E10649 Type 1 diabetes mellitus with hypoglycemia without coma: Secondary | ICD-10-CM | POA: Diagnosis present

## 2017-09-03 DIAGNOSIS — E1042 Type 1 diabetes mellitus with diabetic polyneuropathy: Secondary | ICD-10-CM | POA: Diagnosis present

## 2017-09-03 DIAGNOSIS — E871 Hypo-osmolality and hyponatremia: Secondary | ICD-10-CM | POA: Diagnosis not present

## 2017-09-03 DIAGNOSIS — E875 Hyperkalemia: Secondary | ICD-10-CM | POA: Diagnosis present

## 2017-09-03 DIAGNOSIS — D62 Acute posthemorrhagic anemia: Secondary | ICD-10-CM | POA: Diagnosis not present

## 2017-09-03 DIAGNOSIS — S88111S Complete traumatic amputation at level between knee and ankle, right lower leg, sequela: Secondary | ICD-10-CM

## 2017-09-03 DIAGNOSIS — I10 Essential (primary) hypertension: Secondary | ICD-10-CM | POA: Diagnosis present

## 2017-09-03 DIAGNOSIS — Z89511 Acquired absence of right leg below knee: Secondary | ICD-10-CM | POA: Diagnosis not present

## 2017-09-03 DIAGNOSIS — E1142 Type 2 diabetes mellitus with diabetic polyneuropathy: Secondary | ICD-10-CM

## 2017-09-03 DIAGNOSIS — K5900 Constipation, unspecified: Secondary | ICD-10-CM | POA: Diagnosis present

## 2017-09-03 DIAGNOSIS — Z22322 Carrier or suspected carrier of Methicillin resistant Staphylococcus aureus: Secondary | ICD-10-CM

## 2017-09-03 DIAGNOSIS — Z825 Family history of asthma and other chronic lower respiratory diseases: Secondary | ICD-10-CM

## 2017-09-03 DIAGNOSIS — E1165 Type 2 diabetes mellitus with hyperglycemia: Secondary | ICD-10-CM | POA: Diagnosis not present

## 2017-09-03 DIAGNOSIS — G8918 Other acute postprocedural pain: Secondary | ICD-10-CM

## 2017-09-03 DIAGNOSIS — Z794 Long term (current) use of insulin: Secondary | ICD-10-CM

## 2017-09-03 DIAGNOSIS — Z4781 Encounter for orthopedic aftercare following surgical amputation: Principal | ICD-10-CM

## 2017-09-03 DIAGNOSIS — E10621 Type 1 diabetes mellitus with foot ulcer: Secondary | ICD-10-CM | POA: Diagnosis present

## 2017-09-03 DIAGNOSIS — E108 Type 1 diabetes mellitus with unspecified complications: Secondary | ICD-10-CM | POA: Diagnosis not present

## 2017-09-03 DIAGNOSIS — Z720 Tobacco use: Secondary | ICD-10-CM

## 2017-09-03 DIAGNOSIS — E1065 Type 1 diabetes mellitus with hyperglycemia: Secondary | ICD-10-CM

## 2017-09-03 DIAGNOSIS — N179 Acute kidney failure, unspecified: Secondary | ICD-10-CM | POA: Diagnosis present

## 2017-09-03 DIAGNOSIS — L97519 Non-pressure chronic ulcer of other part of right foot with unspecified severity: Secondary | ICD-10-CM | POA: Diagnosis not present

## 2017-09-03 DIAGNOSIS — E1043 Type 1 diabetes mellitus with diabetic autonomic (poly)neuropathy: Secondary | ICD-10-CM

## 2017-09-03 DIAGNOSIS — K5903 Drug induced constipation: Secondary | ICD-10-CM

## 2017-09-03 LAB — GLUCOSE, CAPILLARY
GLUCOSE-CAPILLARY: 126 mg/dL — AB (ref 65–99)
GLUCOSE-CAPILLARY: 93 mg/dL (ref 65–99)
Glucose-Capillary: 91 mg/dL (ref 65–99)
Glucose-Capillary: 160 mg/dL — ABNORMAL HIGH (ref 65–99)

## 2017-09-03 LAB — CBC
HEMATOCRIT: 34 % — AB (ref 39.0–52.0)
HEMOGLOBIN: 11.2 g/dL — AB (ref 13.0–17.0)
MCH: 27.5 pg (ref 26.0–34.0)
MCHC: 32.9 g/dL (ref 30.0–36.0)
MCV: 83.5 fL (ref 78.0–100.0)
Platelets: 507 10*3/uL — ABNORMAL HIGH (ref 150–400)
RBC: 4.07 MIL/uL — ABNORMAL LOW (ref 4.22–5.81)
RDW: 12.8 % (ref 11.5–15.5)
WBC: 10.3 10*3/uL (ref 4.0–10.5)

## 2017-09-03 LAB — CREATININE, SERUM: Creatinine, Ser: 1.4 mg/dL — ABNORMAL HIGH (ref 0.61–1.24)

## 2017-09-03 MED ORDER — DOCUSATE SODIUM 100 MG PO CAPS
100.0000 mg | ORAL_CAPSULE | Freq: Two times a day (BID) | ORAL | Status: DC
  Administered 2017-09-03 – 2017-09-11 (×16): 100 mg via ORAL
  Filled 2017-09-03 (×16): qty 1

## 2017-09-03 MED ORDER — ONDANSETRON HCL 4 MG/2ML IJ SOLN: 4.0000 mg | Freq: Four times a day (QID) | INTRAMUSCULAR | Status: DC | PRN

## 2017-09-03 MED ORDER — INSULIN ASPART 100 UNIT/ML ~~LOC~~ SOLN
0.0000 [IU] | Freq: Three times a day (TID) | SUBCUTANEOUS | Status: DC
  Administered 2017-09-04 – 2017-09-07 (×5): 2 [IU] via SUBCUTANEOUS
  Administered 2017-09-07 – 2017-09-08 (×2): 3 [IU] via SUBCUTANEOUS
  Administered 2017-09-08 – 2017-09-10 (×3): 2 [IU] via SUBCUTANEOUS

## 2017-09-03 MED ORDER — ENOXAPARIN SODIUM 40 MG/0.4ML ~~LOC~~ SOLN: 40.0000 mg | SUBCUTANEOUS | Status: DC

## 2017-09-03 MED ORDER — ENOXAPARIN SODIUM 40 MG/0.4ML ~~LOC~~ SOLN
40.0000 mg | SUBCUTANEOUS | Status: DC
  Administered 2017-09-04 – 2017-09-10 (×7): 40 mg via SUBCUTANEOUS
  Filled 2017-09-03 (×7): qty 0.4

## 2017-09-03 MED ORDER — INSULIN GLARGINE 100 UNIT/ML ~~LOC~~ SOLN
25.0000 [IU] | Freq: Two times a day (BID) | SUBCUTANEOUS | Status: DC
  Administered 2017-09-03 – 2017-09-04 (×3): 25 [IU] via SUBCUTANEOUS
  Filled 2017-09-03 (×4): qty 0.25

## 2017-09-03 MED ORDER — OXYCODONE HCL 5 MG PO TABS
5.0000 mg | ORAL_TABLET | ORAL | Status: DC | PRN
  Administered 2017-09-03 – 2017-09-04 (×5): 10 mg via ORAL
  Administered 2017-09-05: 5 mg via ORAL
  Administered 2017-09-05 – 2017-09-11 (×22): 10 mg via ORAL
  Filled 2017-09-03 (×28): qty 2

## 2017-09-03 MED ORDER — AMLODIPINE BESYLATE 5 MG PO TABS
5.0000 mg | ORAL_TABLET | Freq: Every day | ORAL | Status: DC
  Administered 2017-09-04 – 2017-09-11 (×8): 5 mg via ORAL
  Filled 2017-09-03 (×8): qty 1

## 2017-09-03 MED ORDER — METHOCARBAMOL 1000 MG/10ML IJ SOLN
500.0000 mg | Freq: Four times a day (QID) | INTRAVENOUS | Status: DC | PRN
  Filled 2017-09-03: qty 5

## 2017-09-03 MED ORDER — METHOCARBAMOL 500 MG PO TABS
500.0000 mg | ORAL_TABLET | Freq: Four times a day (QID) | ORAL | Status: DC | PRN
  Administered 2017-09-04 – 2017-09-07 (×4): 500 mg via ORAL
  Filled 2017-09-03 (×4): qty 1

## 2017-09-03 MED ORDER — POLYETHYLENE GLYCOL 3350 17 G PO PACK
17.0000 g | PACK | Freq: Every day | ORAL | Status: DC | PRN
  Administered 2017-09-08: 17 g via ORAL
  Filled 2017-09-03: qty 1

## 2017-09-03 MED ORDER — BISACODYL 10 MG RE SUPP
10.0000 mg | Freq: Every day | RECTAL | Status: DC | PRN
  Filled 2017-09-03: qty 1

## 2017-09-03 MED ORDER — GLUCERNA SHAKE PO LIQD
237.0000 mL | Freq: Three times a day (TID) | ORAL | Status: DC
  Administered 2017-09-03 – 2017-09-08 (×13): 237 mL via ORAL

## 2017-09-03 MED ORDER — INSULIN ASPART 100 UNIT/ML ~~LOC~~ SOLN
10.0000 [IU] | Freq: Three times a day (TID) | SUBCUTANEOUS | Status: DC
  Administered 2017-09-04 – 2017-09-11 (×15): 10 [IU] via SUBCUTANEOUS

## 2017-09-03 MED ORDER — ONDANSETRON HCL 4 MG PO TABS: 4.0000 mg | ORAL_TABLET | Freq: Four times a day (QID) | ORAL | Status: DC | PRN

## 2017-09-03 MED ORDER — ACETAMINOPHEN 325 MG PO TABS: 325.0000 mg | ORAL_TABLET | Freq: Four times a day (QID) | ORAL | Status: DC | PRN

## 2017-09-03 MED ORDER — SORBITOL 70 % SOLN
30.0000 mL | Freq: Every day | Status: DC | PRN
  Administered 2017-09-07: 30 mL via ORAL
  Filled 2017-09-03: qty 30

## 2017-09-03 NOTE — IPOC Note (Signed)
Overall Plan of Care [facility]) Patient Details Name: Christopher Callahan MRN: [id number] DOB: 01/12/1991  Admitting Diagnosis: Right BKA  Hospital Problems: Active Problems:   S/P below knee amputation, right (HCC)   Unilateral complete BKA, right, sequela (HCC)   Poorly controlled type 2 diabetes mellitus with peripheral neuropathy (HCC)   Hyperkalemia   AKI (acute kidney injury) (HCC)     Functional Problem List: Nursing Edema, Skin Integrity, Medication Management, Pain, Perception, Safety  PT Balance, Edema, Endurance, Motor, Pain, Skin Integrity, Sensory  OT Balance, Endurance, Pain  SLP    TR         Basic ADL's: OT Grooming, Bathing, Dressing, Toileting     Advanced  ADL's: OT Simple Meal Preparation     Transfers: PT Bed Mobility, Bed to Chair, Car, Furniture, Floor  OT Tub/Shower     Locomotion: PT Ambulation, Psychologist, prison and probation services, Stairs     Additional Impairments: OT None  SLP        TR      Anticipated Outcomes Item Anticipated Outcome  Self Feeding independent  Swallowing      Basic self-care  modified independent  Toileting  modified independent   Bathroom Transfers modified independent  Bowel/Bladder  Remain continent B/B  Transfers  Mod I basic  Locomotion  mod I w/c amd household gait; min stairs  Communication     Cognition     Pain  <3 on a 0-10 pain scale  Safety/Judgment  Remain free of falls while on rehab   Therapy Plan: PT Intensity: Minimum of 1-2 x/day ,45 to 90 minutes PT Frequency: 5 out of 7 days PT Duration Estimated Length of Stay: 5-7 days OT Intensity: Minimum of 1-2 x/day, 45 to 90 minutes OT Frequency: 5 out of 7 days OT Duration/Estimated Length of Stay: 5-7 days      Team Interventions: Nursing Interventions Patient/Family Education, Pain Management, Medication Management, Discharge Planning, Skin Care/Wound Management, Disease Management/Prevention  PT interventions Ambulation/gait training,  Warden/ranger, Community reintegration, Discharge planning, Disease management/prevention, DME/adaptive equipment instruction, Functional mobility training, Neuromuscular re-education, Pain management, Patient/family education, Psychosocial support, Skin care/wound management, Splinting/orthotics, Stair training, Therapeutic Activities, Therapeutic Exercise, UE/LE Strength taining/ROM, UE/LE Coordination activities, Wheelchair propulsion/positioning  OT Interventions Warden/ranger, Fish farm manager, Community reintegration, UE/LE Strength taining/ROM, Therapeutic Exercise, Patient/family education, Functional mobility training, Discharge planning, Pain management, Self Care/advanced ADL retraining, Therapeutic Activities, UE/LE Coordination activities  SLP Interventions    TR Interventions    SW/CM Interventions Discharge Planning, Psychosocial Support, Patient/Family Education   Barriers to Discharge MD  Medical stability, Wound care and Weight bearing restrictions  Nursing Decreased caregiver support, Medication compliance, Weight bearing restrictions    PT Inaccessible home environment, Wound Care 17 stairs to enter apartment; wound vac  OT Inaccessible home environment has steps to get up to the second floor  SLP      SW       Team Discharge Planning: Destination: PT-Home ,OT- Home , SLP-  Projected Follow-up: PT-Outpatient PT, OT-  Home health OT, SLP-  Projected Equipment Needs: PT-Wheelchair (measurements), Wheelchair cushion (measurements), OT- 3 in 1 bedside comode, Tub/shower bench, SLP-  Equipment Details: PT-has RW delivered, OT-  Patient/family involved in discharge planning: PT- Patient,  OT-Patient, SLP-   MD ELOS: 4-7 days. Medical Rehab Prognosis:  Good Assessment: Christopher Callahan is a 27 y.o. right-handed male with history of T1DM with neuropathy, tobacco use, chronic diabetic foot ulcers with worsening despite antibiotics.   He was admitted on 08/24/17  with ulceration of the right foot and increased drainage, malaise with aches and pain. Patient reports being maintained on oral antibiotics for wound care and inability to manage (not acting right) at home [place] [facility]  He was started on broad spectrum antibiotics for sepsis due to right foot osteomyelitis with abscess and IV insulin due to BS 856 as well as fluids for AKI. Dr. 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 Post op continues to have poorly controlled BS.  Pain management as directed.  Hyponatremia nad leucocytosis have resolved.   Acute blood loss anemia monitored.  Patient with resulting functional deficits with mobility, transfers, self-care.  Will set goals for Mod I at wheelchair level with PT/OT.   See Team Conference Notes for weekly updates to the plan of care

## 2017-09-03 NOTE — Care Management Note (Addendum)
Case Management Note  Patient Details  Name: Christopher Callahan MRN: [id number] Date of Birth: [birth year]  Subjective/Objective:         Admitted  s/p R BKA  08/27/17. Resides with mom and grandmother.       Alvira PhilipsKitina Kellin (Mother) Lubertha SayresMargeut Brown [phone](Grandmother)    [phone]      PCP: Jacquelynn CreeAngela Coleman  Action/Plan: Transition to CIR vs home with home health services. Still awaiting insurance approval for CIR, [facility] 4/3 per Rehab. Admission Coordinator.    Pt will need transportation to home provided by [facility]  If discharges to home arranged per NCM.    Expected Discharge Date:  09/02/17               Expected Discharge Plan:  Home w Home Health Services  In-House Referral:  Clinical Social Work  Discharge planning Services  CM Consult  Post Acute Care Choice:    Choice offered to:  Patient  DME Arranged:  Walker rolling, pt already has @ bedside. DME Agency:  [facility]  HH Arranged:  PT HH Agency:  [facility], [facility] lia[facility] aware of potential home health need,however, referral still will be needed if pt d/c to home.  Pt will need order for home health PT if discharges to home from MD.  Status of Service:  In process, will continue to follow  If discussed at Long Length of Stay Meetings, dates discussed:    Additional Comments:  Epifanio LeschesCole, Mardelle Pandolfi Hudson, RN 09/03/2017, 2:15 PM

## 2017-09-03 NOTE — Progress Notes (Signed)
Patient ID: Christopher Callahan, male   DOB: 1991-03-15, 27 y.o.   MRN: [id number] [facility]  Patient has remained stable for discharge.  His blood sugars are stable.  Refer to the full discharge summary done by me on 08/29/2017 for full details.

## 2017-09-03 NOTE — Progress Notes (Signed)
Rehab admissions - Additional updates sent to federal [employer] for review.  We may hear back this afternoon or it may be tomorrow before we have a decision about inpatient rehab admission.  Call me for questions.  #[phone]

## 2017-09-03 NOTE — Progress Notes (Addendum)
Physical Therapy Treatment Patient Details Name: Christopher Callahan MRN: [id number] DOB: November 13, 1990 Today's Date: 09/03/2017    History of Present Illness 27 yo male with onset of osteomyelitis, abscess, low K+, leukocytosis, and failed ABT was admitted to receive R BKA on 08/27/17.  Pt with significant PMH of DM type 1 and skin graft on his left leg.     PT Comments    Pt is considerably better today than when this PT met him at evaluation.  Has much more confidence and control of standing and the walker, able to use RLE for exercise despite discomfort.  His family is very involved and supportive, and will be available when rehab is done for assisting his mobility as needed.  Follow acutely for strengthening and balance/gait. Despite his improvements, he is not going to be able to return home due to the considerable flight of stairs and his debility to get up from low height even to scoot up stairs.  Will anticipate CIR to take him with insurance approval pending.  Follow Up Recommendations  CIR     Equipment Recommendations  Rolling walker with 5" wheels;Wheelchair (measurements PT);Wheelchair cushion (measurements PT);Other (comment);3in1 (PT)(tub transfer bench, R amputee pad)    Recommendations for Other Services       Precautions / Restrictions Precautions Precautions: Fall Precaution Comments: wound vac, R BKA Required Braces or Orthoses: Other Brace/Splint Other Brace/Splint: splint for R BK Restrictions Weight Bearing Restrictions: Yes    Mobility  Bed Mobility Overal bed mobility: Modified Independent                Transfers Overall transfer level: Needs assistance Equipment used: Rolling walker (2 wheeled);1 person hand held assist Transfers: Sit to/from Stand Sit to Stand: Min guard;Min assist Stand pivot transfers: Min guard       General transfer comment: minor help to steady with initial standing  Ambulation/Gait Ambulation/Gait assistance: Min  guard Ambulation Distance (Feet): 45 Feet Assistive device: Rolling walker (2 wheeled)   Gait velocity: reduced Gait velocity interpretation: Below normal speed for age/gender General Gait Details: improved heel to toe on LLE, smoother and able to maneuver walker around obstacles in his room   Stairs            Wheelchair Mobility    Modified Rankin (Stroke Patients Only)       Balance Overall balance assessment: Needs assistance Sitting-balance support: Feet supported Sitting balance-Leahy Scale: Good     Standing balance support: Bilateral upper extremity supported;During functional activity Standing balance-Leahy Scale: Poor Standing balance comment: UE suppor due to BKA RLE                            Cognition Arousal/Alertness: Awake/alert Behavior During Therapy: WFL for tasks assessed/performed Overall Cognitive Status: Within Functional Limits for tasks assessed                                 General Comments: calmer mood and was talking with family when PT arrived      Exercises General Exercises - Lower Extremity Ankle Circles/Pumps: AROM;Both;5 reps Quad Sets: AROM;Both;10 reps Gluteal Sets: AROM;Both;10 reps Hip ABduction/ADduction: AROM;Both;10 reps    General Comments General comments (skin integrity, edema, etc.): pt has applied the splint to RLE during tx and noted ability to exercise with R stump protected      Pertinent Vitals/Pain Pain Assessment: Faces Faces  Pain Scale: Hurts little more Pain Location: R LE Pain Descriptors / Indicators: Operative site guarding Pain Intervention(s): Limited activity within patient's tolerance;Premedicated before session;Repositioned    Home Living                      Prior Function            PT Goals (current goals can now be found in the care plan section) Acute Rehab PT Goals Patient Stated Goal: to get a prosthesis PT Goal Formulation: With  patient/family Progress towards PT goals: Progressing toward goals    Frequency    Min 3X/week      PT Plan Current plan remains appropriate    Co-evaluation              AM-PAC PT "6 Clicks" Daily Activity  Outcome Measure  Difficulty turning over in bed (including adjusting bedclothes, sheets and blankets)?: None Difficulty moving from lying on back to sitting on the side of the bed? : A Little Difficulty sitting down on and standing up from a chair with arms (e.g., wheelchair, bedside commode, etc,.)?: Unable Help needed moving to and from a bed to chair (including a wheelchair)?: A Little Help needed walking in hospital room?: A Little Help needed climbing 3-5 steps with a railing? : A Lot 6 Click Score: 16    End of Session Equipment Utilized During Treatment: Gait belt Activity Tolerance: Patient limited by fatigue Patient left: in chair;with call bell/phone within reach;with chair alarm set Nurse Communication: Mobility status PT Visit Diagnosis: Unsteadiness on feet (R26.81);Muscle weakness (generalized) (M62.81);Difficulty in walking, not elsewhere classified (R26.2);Pain Pain - Right/Left: Right Pain - part of body: Leg     Time: 1005-1038 PT Time Calculation (min) (ACUTE ONLY): 33 min  Charges:  $Gait Training: 8-22 mins $Therapeutic Exercise: 8-22 mins                    G Codes:  Functional Assessment Tool Used: AM-PAC 6 Clicks Basic Mobility     Christopher Callahan 09/03/2017, 11:28 AM   Christopher Callahan, PT MS Acute Rehab Dept. Number: [place]

## 2017-09-03 NOTE — Progress Notes (Signed)
Patient transferred to [facility]-05 today. Report called to 4[facility] charge nurse, who gave report to MorehouseStacey, Charity fundraiserN.

## 2017-09-03 NOTE — Progress Notes (Signed)
Rehab admissions - I have approval for inpatient rehab admit for today.  MD has cleared patient.  Patient is agreeable.  Will admit to CIR today.  Call me for questions.  #[phone]

## 2017-09-03 NOTE — Progress Notes (Signed)
Patient ID: Christopher Callahan, male   DOB: [birth year], 26 y.o.   MRN: [id number] Patient arrived to to unit with RN and patient belongings. Patient oriented to room, bed alarm, and nurse call bell. Patient resting in bed with bed alarm on and call bell at patients side.

## 2017-09-03 NOTE — H&P (Signed)
Physical Medicine and Rehabilitation Admission H&P    Chief Complaint  Patient presents with  . 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 right-handed male with history of T1DM with neuropathy, tobacco use, chronic diabetic foot ulcers with worsening despite antibiotics.  Per chart review and patient, patient lives with his mother.  One level home with multiple steps.  Independent prior to admission.  He was admitted on 08/24/17 with ulceration of the right foot and increased drainage, malaise with aches and pain. Patient reports being maintained on oral antibiotics for wound care and inability to manage (not acting right) at home therefore moved by family to [facility].  He was started on broad spectrum antibiotics for sepsis due to right foot osteomyelitis with abscess and IV insulin due to BS 856 as well as fluids for AKI. Dr. 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 Post op continues to have poorly controlled BS.  Pain management as directed.  Hyponatremia nad leucocytosis have resolved.   Acute blood loss anemia 10.5 and monitored.  Subcutaneous Lovenox for DVT prophylaxis.  MRSA PCR screening positive with contact precautions. Therapy evaluation done and CIR recommended due to functional deficits .  Patient was admitted for a comprehensive rehab program   Review of Systems  Constitutional: Negative for chills and fever.  HENT: Negative for hearing loss.   Eyes: Negative for blurred vision and double vision.  Respiratory: Negative for cough.   Cardiovascular: Positive for leg swelling. Negative for chest pain and palpitations.  Gastrointestinal: Positive for constipation. Negative for nausea and vomiting.  Genitourinary: Negative for dysuria, flank pain and hematuria.  Musculoskeletal: Positive for joint pain and myalgias.  Skin: Negative for rash.  Neurological: Negative for seizures.  All other systems reviewed and are negative.  Past  Medical History:  Diagnosis Date  . Diabetes mellitus without complication [facility])    Past Surgical History:  Procedure Laterality Date  . AMPUTATION Right 08/27/2017   Procedure: RIGHT BELOW KNEE AMPUTATION;  Surgeon: Nadara Mustard, MD;  Location: [facility] OR;  Service: Orthopedics;  Laterality: Right;  . CHOLECYSTECTOMY    . SKIN GRAFT      Family History  Problem Relation Age of Onset  . COPD Father     Social History:  Single. 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 Moved to [place] by mother due to current illness. He reports that he has been smoking cigarettes.  He has been smoking about 0.30 packs per day. He has never used smokeless tobacco. He reports that he does not drink alcohol or use drugs.    Allergies: No Known Allergies    Medications Prior to Admission  Medication Sig Dispense Refill  . [DISCONTINUED] insulin aspart (NOVOLOG) 100 UNIT/ML FlexPen Inject 5 Units into the skin 3 (three) times daily.    . [DISCONTINUED] Insulin Detemir (LEVEMIR FLEXTOUCH) 100 UNIT/ML Pen Inject 24 Units into the skin 2 (two) times daily.    Marland Kitchen dicyclomine (BENTYL) 20 MG tablet Take 1 tablet (20 mg total) by mouth 2 (two) times daily as needed for spasms (abdominal pain). (Patient not taking: Reported on 08/24/2017) 10 tablet 0  . metoCLOPramide (REGLAN) 5 MG tablet Take 1 tablet (5 mg total) by mouth every 8 (eight) hours as needed for nausea. (Patient not taking: Reported on 08/24/2017) 15 tablet 0  . promethazine (PHENERGAN) 25 MG tablet Take 1 tablet (25 mg total) by mouth every  6 (six) hours as needed for nausea or vomiting. (Patient not taking: Reported on 08/24/2017) 30 tablet 0    Drug Regimen Review  Drug regimen was reviewed and remains appropriate with no significant issues identified  Home: Home Living Family/patient expects to be discharged to:: Inpatient rehab Living Arrangements: Parent Available Help at Discharge: Family, Available 24 hours/day(mom works from home) Type  of Home: Apartment Home Access: Stairs to enter Secretary/administrator of Steps: 17 Entrance Stairs-Rails: Right, Left Home Layout: One level Bathroom Shower/Tub: Engineer, manufacturing systems: Standard Home Equipment: None   Functional History: Prior Function Level of Independence: Independent Comments: lives with his mother  Functional Status:  Mobility: Bed Mobility Overal bed mobility: Modified Independent General bed mobility comments: HOB up Transfers Overall transfer level: Needs assistance Equipment used: Rolling walker (2 wheeled), 1 person hand held assist Transfers: Sit to/from Stand Sit to Stand: Min guard, Min assist Stand pivot transfers: Min guard General transfer comment: minor help to steady with initial standing Ambulation/Gait Ambulation/Gait assistance: Min guard Ambulation Distance (Feet): 45 Feet Assistive device: Rolling walker (2 wheeled) Gait Pattern/deviations: Step-to pattern, Trunk flexed, Shuffle, Decreased stride length General Gait Details: improved heel to toe on LLE, smoother and able to maneuver walker around obstacles in his room Gait velocity: reduced Gait velocity interpretation: Below normal speed for age/gender    ADL: ADL Overall ADL's : Needs assistance/impaired Eating/Feeding: Independent, Bed level Grooming: Wash/dry hands, Wash/dry face, Oral care, Sitting, Set up Upper Body Bathing: Set up, Sitting Lower Body Bathing: Minimal assistance, Sitting/lateral leans Upper Body Dressing : Set up, Sitting Lower Body Dressing: Minimal assistance, Sitting/lateral leans Toilet Transfer: Minimal assistance, Stand-pivot, RW Toileting- Clothing Manipulation and Hygiene: Minimal assistance, Sitting/lateral lean General ADL Comments: Educated pt in compensatory strategies for ADL.  Cognition: Cognition Overall Cognitive Status: Within Functional Limits for tasks assessed Orientation Level: Oriented X4 Cognition Arousal/Alertness:  Awake/alert Behavior During Therapy: WFL for tasks assessed/performed Overall Cognitive Status: Within Functional Limits for tasks assessed General Comments: calmer mood and was talking with family when PT arrived   Blood pressure 140/82, pulse 95, temperature 98.3 F (36.8 C), resp. rate 17, height 6\' 1"  (1.854 m), weight 106.6 kg (235 lb), SpO2 99 %. Physical Exam  Vitals reviewed. Constitutional: He is oriented to person, place, and time. He appears well-developed.  Obese  HENT:  Head: Normocephalic and atraumatic.  Eyes: EOM are normal. Right eye exhibits no discharge. Left eye exhibits no discharge.  Neck: Normal range of motion. Neck supple. No thyromegaly present.  Cardiovascular: Normal rate, regular rhythm and normal heart sounds.  Respiratory: Effort normal and breath sounds normal. No respiratory distress.  GI: Soft. Bowel sounds are normal. He exhibits no distension.  Musculoskeletal:  RLE stump with tenderness  Neurological: He is alert and oriented to person, place, and time.  Speech clear.  Able to follow basic commands without difficulty.  Motor: B/l UE 4/5.  LLE 4/5.  RLE: HF 4/5  Skin:  Right BKA site is dressed with wound VAC in place.  Patient with well-healed scar on dorsal surface of left foot and callus ulcer on plantar surface under great toe.    Results for orders placed or performed during the hospital encounter of 08/24/17 (from the past 48 hour(s))  Glucose, capillary     Status: Abnormal   Collection Time: 09/01/17  5:06 PM  Result Value Ref Range   Glucose-Capillary 100 (H) 65 - 99 mg/dL  Glucose, capillary     Status: Abnormal  Collection Time: 09/01/17  9:15 PM  Result Value Ref Range   Glucose-Capillary 110 (H) 65 - 99 mg/dL  Glucose, capillary     Status: None   Collection Time: 09/02/17  8:06 AM  Result Value Ref Range   Glucose-Capillary 92 65 - 99 mg/dL  Glucose, capillary     Status: Abnormal   Collection Time: 09/02/17  2:41 PM    Result Value Ref Range   Glucose-Capillary 130 (H) 65 - 99 mg/dL  Glucose, capillary     Status: Abnormal   Collection Time: 09/02/17  5:22 PM  Result Value Ref Range   Glucose-Capillary 112 (H) 65 - 99 mg/dL  Glucose, capillary     Status: Abnormal   Collection Time: 09/02/17 10:11 PM  Result Value Ref Range   Glucose-Capillary 124 (H) 65 - 99 mg/dL  Glucose, capillary     Status: Abnormal   Collection Time: 09/03/17  7:57 AM  Result Value Ref Range   Glucose-Capillary 126 (H) 65 - 99 mg/dL  Glucose, capillary     Status: None   Collection Time: 09/03/17 12:16 PM  Result Value Ref Range   Glucose-Capillary 91 65 - 99 mg/dL   No results found.     Medical Problem List and Plan: 1.  Decreased functional mobility secondary to right BKA 08/27/2017 2.  DVT Prophylaxis/Anticoagulation: Subcutaneous Lovenox.  Monitor for any bleeding episodes 3. Pain Management: Robaxin and oxycodone as needed 4. Mood: Provide emotional support 5. Neuropsych: This patient is capable of making decisions on his own behalf. 6. Skin/Wound Care: Routine skin checks 7. Fluids/Electrolytes/Nutrition: Routine INO's with follow-up chemistries 8.  Acute blood loss anemia.  Follow-up CBC 9.  Diabetes mellitus with peripheral neuropathy.  Hemoglobin A1c 11.1.  NovoLog 10 units 3 times daily, Lantus insulin 25 units twice daily.  Check blood sugars before meals and at bedtime.  Diabetic teaching 10.  Hypertension.  Norvasc 5 mg daily. 11.  MRSA PCR screen positive.  Contact precautions 12.  Tobacco abuse.  Counseling 13.  Constipation.  Laxative assistance   Post Admission Physician Evaluation: 1. Preadmission assessment reviewed and changes made below. 2. Functional deficits secondary  to right BKA. 3. Patient is admitted to receive collaborative, interdisciplinary care between the physiatrist, rehab nursing staff, and therapy team. 4. Patient's level of medical complexity and substantial therapy needs in  context of that medical necessity cannot be provided at a lesser intensity of care such as a SNF. 5. Patient has experienced substantial functional loss from his/her baseline which was documented above under the "Functional History" and "Functional Status" headings.  Judging by the patient's diagnosis, physical exam, and functional history, the patient has potential for functional progress which will result in measurable gains while on inpatient rehab.  These gains will be of substantial and practical use upon discharge  in facilitating mobility and self-care at the household level. 6. Physiatrist will provide 24 hour management of medical needs as well as oversight of the therapy plan/treatment and provide guidance as appropriate regarding the interaction of the two. 7. 24 hour rehab nursing will assist with bladder management, safety, skin/wound care, disease management, pain management and patient education  and help integrate therapy concepts, techniques,education, etc. 8. PT will assess and treat for/with: Lower extremity strength, range of motion, stamina, balance, functional mobility, safety, adaptive techniques and equipment, woundcare, coping skills, pain control, pre-prosthetic education.   Goals are: Mod I at wheelchair level. 9. OT will assess and treat for/with: ADL's, functional mobility,  safety, upper extremity strength, adaptive techniques and equipment, wound mgt, ego support, and community reintegration.   Goals are: Mod I at wheelchair level. Therapy may not proceed with showering this patient. 10. Case Management and Social Worker will assess and treat for psychological issues and discharge planning. 11. Team conference will be held weekly to assess progress toward goals and to determine barriers to discharge. 12. Patient will receive at least 3 hours of therapy per day at least 5 days per week. 13. ELOS: 5-9 days.       14. Prognosis:  good  Maryla Morrow, MD, ABPMR Rolinda Roan,  PA-C 09/03/2017

## 2017-09-03 NOTE — Progress Notes (Signed)
Rehab admissions - I have called and left a message with insurance carrier.  I have faxed updates.  No response yet from [employer].  I will update all once I hear back from insurance case manager.  Call me for questions.  #[phone]

## 2017-09-04 ENCOUNTER — Inpatient Hospital Stay (HOSPITAL_COMMUNITY): Payer: Federal, State, Local not specified - PPO

## 2017-09-04 ENCOUNTER — Inpatient Hospital Stay (HOSPITAL_COMMUNITY): Payer: Federal, State, Local not specified - PPO | Admitting: Physical Therapy

## 2017-09-04 ENCOUNTER — Inpatient Hospital Stay (HOSPITAL_COMMUNITY): Payer: Federal, State, Local not specified - PPO | Admitting: Occupational Therapy

## 2017-09-04 DIAGNOSIS — I10 Essential (primary) hypertension: Secondary | ICD-10-CM

## 2017-09-04 DIAGNOSIS — E875 Hyperkalemia: Secondary | ICD-10-CM

## 2017-09-04 DIAGNOSIS — E1142 Type 2 diabetes mellitus with diabetic polyneuropathy: Secondary | ICD-10-CM

## 2017-09-04 DIAGNOSIS — Z89511 Acquired absence of right leg below knee: Secondary | ICD-10-CM

## 2017-09-04 DIAGNOSIS — N179 Acute kidney failure, unspecified: Secondary | ICD-10-CM

## 2017-09-04 DIAGNOSIS — S88111S Complete traumatic amputation at level between knee and ankle, right lower leg, sequela: Secondary | ICD-10-CM

## 2017-09-04 DIAGNOSIS — D62 Acute posthemorrhagic anemia: Secondary | ICD-10-CM

## 2017-09-04 DIAGNOSIS — E1165 Type 2 diabetes mellitus with hyperglycemia: Secondary | ICD-10-CM

## 2017-09-04 LAB — CBC WITH DIFFERENTIAL/PLATELET
BASOS ABS: 0 10*3/uL (ref 0.0–0.1)
Basophils Relative: 1 %
EOS ABS: 0.1 10*3/uL (ref 0.0–0.7)
EOS PCT: 1 %
HCT: 32 % — ABNORMAL LOW (ref 39.0–52.0)
Hemoglobin: 10.6 g/dL — ABNORMAL LOW (ref 13.0–17.0)
Lymphocytes Relative: 35 %
Lymphs Abs: 2.5 10*3/uL (ref 0.7–4.0)
MCH: 27.7 pg (ref 26.0–34.0)
MCHC: 33.1 g/dL (ref 30.0–36.0)
MCV: 83.8 fL (ref 78.0–100.0)
MONO ABS: 0.5 10*3/uL (ref 0.1–1.0)
Monocytes Relative: 7 %
Neutro Abs: 4 10*3/uL (ref 1.7–7.7)
Neutrophils Relative %: 56 %
PLATELETS: 464 10*3/uL — AB (ref 150–400)
RBC: 3.82 MIL/uL — AB (ref 4.22–5.81)
RDW: 13 % (ref 11.5–15.5)
WBC: 7.1 10*3/uL (ref 4.0–10.5)

## 2017-09-04 LAB — COMPREHENSIVE METABOLIC PANEL
ALT: 14 U/L — ABNORMAL LOW (ref 17–63)
AST: 15 U/L (ref 15–41)
Albumin: 2.5 g/dL — ABNORMAL LOW (ref 3.5–5.0)
Alkaline Phosphatase: 92 U/L (ref 38–126)
Anion gap: 8 (ref 5–15)
BUN: 24 mg/dL — ABNORMAL HIGH (ref 6–20)
CO2: 26 mmol/L (ref 22–32)
Calcium: 8.7 mg/dL — ABNORMAL LOW (ref 8.9–10.3)
Chloride: 101 mmol/L (ref 101–111)
Creatinine, Ser: 1.32 mg/dL — ABNORMAL HIGH (ref 0.61–1.24)
Glucose, Bld: 166 mg/dL — ABNORMAL HIGH (ref 65–99)
POTASSIUM: 5.4 mmol/L — AB (ref 3.5–5.1)
SODIUM: 135 mmol/L (ref 135–145)
Total Bilirubin: 0.5 mg/dL (ref 0.3–1.2)
Total Protein: 6.7 g/dL (ref 6.5–8.1)

## 2017-09-04 LAB — GLUCOSE, CAPILLARY
GLUCOSE-CAPILLARY: 135 mg/dL — AB (ref 65–99)
GLUCOSE-CAPILLARY: 81 mg/dL (ref 65–99)
GLUCOSE-CAPILLARY: 62 mg/dL — AB (ref 65–99)
GLUCOSE-CAPILLARY: 74 mg/dL (ref 65–99)
Glucose-Capillary: 133 mg/dL — ABNORMAL HIGH (ref 65–99)
Glucose-Capillary: 216 mg/dL — ABNORMAL HIGH (ref 65–99)

## 2017-09-04 MED ORDER — INSULIN GLARGINE 100 UNIT/ML ~~LOC~~ SOLN
20.0000 [IU] | Freq: Every day | SUBCUTANEOUS | Status: DC
  Administered 2017-09-05 – 2017-09-11 (×7): 20 [IU] via SUBCUTANEOUS
  Filled 2017-09-04 (×8): qty 0.2

## 2017-09-04 NOTE — Progress Notes (Signed)
Social Work  Social Work Assessment and Plan  Patient Details  Name: Christopher Callahan MRN: [id number] Date of Birth: March 15, 1991  Today's Date: 09/04/2017  Problem List:  Patient Active Problem List   Diagnosis Date Noted  . Unilateral complete BKA, right, sequela (HCC)   . Poorly controlled type 2 diabetes mellitus with peripheral neuropathy (HCC)   . Hyperkalemia   . AKI (acute kidney injury) (HCC)   . S/P below knee amputation, right (HCC) 09/03/2017  . Drug induced constipation   . Tobacco abuse   . Benign essential HTN   . Type 2 diabetes mellitus with peripheral neuropathy (HCC)   . Acute blood loss anemia   . Post-operative pain   . Subacute osteomyelitis, right ankle and foot (HCC)   . Diabetic foot infection (HCC)   . Sepsis (HCC)   . Severe sepsis (HCC) 08/24/2017  . Essential hypertension 09/04/2016  . Type 1 diabetes mellitus with diabetic autonomic neuropathy, with long-term current use of insulin (HCC) 09/03/2016  . Diabetic gastroparesis associated with type 1 diabetes mellitus (HCC) 09/03/2016   Past Medical History:  Past Medical History:  Diagnosis Date  . Diabetes mellitus without complication [facility](HCC)    Past Surgical History:  Past Surgical History:  Procedure Laterality Date  . AMPUTATION Right 08/27/2017   Procedure: RIGHT BELOW KNEE AMPUTATION;  Surgeon: Nadara Mustarduda, Marcus V, MD;  [facility];  Service: Orthopedics;  Laterality: Right;  . CHOLECYSTECTOMY    . SKIN GRAFT     Social History:  reports that he has been smoking cigarettes.  He has been smoking about 0.30 packs per day. He has never used smokeless tobacco. He reports that he does not drink alcohol or use drugs.  Family / Support Systems Marital Status: Single Patient Roles: Other (Comment)(son) Other Supports: Sherlynn CarbonKitina Kellin-Mom [phone]  Benjamin StainMargaret Brown-grandmother [phone] Anticipated Caregiver: Mom Ability/Limitations of Caregiver: Works form home and can assist-live on 2nd  floor apartment Caregiver Availability: 24/7 Family Dynamics: Pt has been staying with his Mom since she went up to [place] to get him when he was ill. His grandmother came down from [place] to make sure ok. He has limited supports via just he and his Mom. [place]  Social History Preferred language: English Religion:  Cultural Background: No issues Education: [employer] Read: Yes Write: Yes Employment Status: Unemployed Fish farm managerLegal Hisotry/Current Legal Issues: No issues Guardian/Conservator: None-according to MD pt is capable of making his own decisions while here   Abuse/Neglect Abuse/Neglect Assessment Can Be Completed: Yes Physical Abuse: Denies Verbal Abuse: Denies Sexual Abuse: Denies Exploitation of patient/patient's resources: Denies Self-Neglect: Denies  Emotional Status Pt's affect, behavior adn adjustment status: Pt has always been independent and was doing well on his own then got sick and just couldn't get better. He was not taking meds and caring for himself. His Mom had to go up there and get him and bring him back to [place]. He says he wants to be independent but his actions do not mesh with his words. Recent Psychosocial Issues: non-healing foot ulcer and non-compliance with DM Pyschiatric History: No history probably would benefit from seeing neuro-psych while here due to not making good decisions or judgements prior to admission. Will need to change his ways or he will have more health issues  Substance Abuse History: Tobacco aware needs to quit smoking and may or may not, has not decided  Patient / Family Perceptions, Expectations & Goals Pt/Family understanding of illness & functional limitations: Pt  and Mom have a good understanding of his amputation, hopeful he will do well and it will heal and he can then get a prothesis. Will ask RN and MD to provide information regarding diabetes and wounds while here. Premorbid pt/family roles/activities: Son, grandson,  friend, etc Anticipated changes in roles/activities/participation: resume Pt/family expectations/goals: Pt states: " I want to do for myself, I will be fine."  Mom states: " I hope he does well here but will assist him with what he needs."  [employer]: None Premorbid Home Care/DME Agencies: None Transportation available at discharge: Mom Resource referrals recommended: Neuropsychology, Support group (specify)  Discharge Planning Living Arrangements: Parent Support Systems: Parent, Other relatives Type of Residence: Private residence Insurance Resources: Media planner (specify)([employer]) Financial Resources: Family Support Financial Screen Referred: No Living Expenses: Lives with family Money Management: Family Does the patient have any problems obtaining your medications?: No Home Management: Mom Patient/Family Preliminary Plans: Return home with Mom who can assist since she works from home. She does live in a second story apartment and pt will need to be able to go up a flight of stairs to get to it. Will await therapy evaluations and work on a safe plan for him. Social Work Anticipated Follow Up Needs: HH/OP, Support Group  Clinical Impression Young gentleman who acts younger than his stated age. He is saying he wants to be independent but will see how well he pushes himself in therapies. He has neglected his health and needs to change his life style habits to further his decline. Will work on safe discharge plan and await therapists evaluations. Will make referral to neuro-psych to be seen for coping while here.  Lucy Chris 09/04/2017, 1:42 PM

## 2017-09-04 NOTE — Evaluation (Signed)
Occupational Therapy Assessment and Plan  Patient Details  Name: Christopher Callahan MRN: [id number] Date of Birth: 10-02-1990  OT Diagnosis: acute pain and muscle weakness (generalized) Rehab Potential: Rehab Potential (ACUTE ONLY): Excellent ELOS: 5-7 days   Today's Date: 09/04/2017 OT Individual Time: 6834-1962 OT Individual Time Calculation (min): 59 min     Problem List:  Patient Active Problem List   Diagnosis Date Noted  . Unilateral complete BKA, right, sequela ([place])   . Poorly controlled type 2 diabetes mellitus with peripheral neuropathy ([place])   . Hyperkalemia   . AKI (acute kidney injury) ([place])   . S/P below knee amputation, right ([place]) 09/03/2017  . Drug induced constipation   . Tobacco abuse   . Benign essential HTN   . Type 2 diabetes mellitus with peripheral neuropathy (H[facility])   . Acute blood loss anemia   . Post-operative pain   . Subacute osteomyelitis, right ankle and foot ([place])   . Diabetic foot infection ([place])   . Sepsis ([place])   . Severe sepsis ([place]) 08/24/2017  . Essential hypertension 09/04/2016  . Type 1 diabetes mellitus with diabetic autonomic neuropathy, with long-term current use of insulin ([place]) 09/03/2016  . Diabetic gastroparesis associated with type 1 diabetes mellitus ([place]) 09/03/2016    Past Medical History:  Past Medical History:  Diagnosis Date  . Diabetes mellitus without complication [facility])    Past Surgical History:  Past Surgical History:  Procedure Laterality Date  . AMPUTATION Right 08/27/2017   Procedure: RIGHT BELOW KNEE AMPUTATION;  Surgeon: Newt Minion, MD;  Location: [place];  Service: Orthopedics;  Laterality: Right;  . CHOLECYSTECTOMY    . SKIN GRAFT      Assessment & Plan Clinical Impression: Patient is a 27 y.o. year old male with recent admission to the hospital on 08/24/17 with ulceration of the right foot and increased drainage, malaise with aches and pain. Patient reports being maintained on oral antibiotics for wound care  and inability to manage (not acting right) at home therefore moved by family to [facility].He was started on broad spectrumantibiotics for sepsis due to right foot osteomyelitis with abscess and IV insulin due to BS 856 as well as fluids for AKI. Dr. 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    Patient transferred to CIR on 09/03/2017 .    Patient currently requires min with basic self-care skills secondary to muscle weakness and decreased standing balance and decreased balance strategies.  Prior to hospitalization, patient could complete ADLs with independent .  Patient will benefit from skilled intervention to decrease level of assist with basic self-care skills and increase independence with basic self-care skills prior to discharge home with care partner.  Anticipate patient will require intermittent supervision and follow up home health.  OT - End of Session Activity Tolerance: Tolerates 30+ min activity with multiple rests Endurance Deficit: Yes OT Assessment Rehab Potential (ACUTE ONLY): Excellent OT Barriers to Discharge: Inaccessible home environment OT Barriers to Discharge Comments: has steps to get up to the second floor OT Patient demonstrates impairments in the following area(s): Balance;Endurance;Pain OT Basic ADL's Functional Problem(s): Grooming;Bathing;Dressing;Toileting OT Advanced ADL's Functional Problem(s): Simple Meal Preparation OT Transfers Functional Problem(s): Tub/Shower OT Additional Impairment(s): None OT Plan OT Intensity: Minimum of 1-2 x/day, 45 to 90 minutes OT Frequency: 5 out of 7 days OT Duration/Estimated Length of Stay: 5-7 days OT Treatment/Interventions: Balance/vestibular training;DME/adaptive equipment instruction;Community reintegration;UE/LE Strength taining/ROM;Therapeutic Exercise;Patient/family education;Functional mobility training;Discharge planning;Pain management;Self Care/advanced ADL retraining;Therapeutic Activities;UE/LE  Coordination activities OT Self Feeding Anticipated Outcome(s): independent OT Basic Self-Care Anticipated Outcome(s): modified independent OT Toileting Anticipated Outcome(s): modified independent OT Bathroom Transfers Anticipated Outcome(s): modified independent OT Recommendation Patient destination: Home Follow Up Recommendations: Home health OT Equipment Recommended: 3 in 1 bedside comode;Tub/shower bench   Skilled Therapeutic Intervention Pt began working on selfcare retraining sit to stand from the EOB during session.  Min assist for sit to stand and for standing balance while completing LB bathing and pulling underpants and short over his hips.  He was able to take 4-5 small hops with the RW as well to the wheelchair.  Grooming tasks completed from wheelchair level at the sink.  Still with wound VAC in place and pain rated at 2-4/10 throughout session.  Biggest barrier to discharge will be steps as mom's apartment is on the second level.   OT Evaluation Precautions/Restrictions  Precautions Precautions: Fall Precaution Comments: wound vac, R BKA Required Braces or Orthoses: Other Brace/Splint Other Brace/Splint: splint for R BK Restrictions Weight Bearing Restrictions: Yes RLE Weight Bearing: Non weight bearing  Pain Pain Assessment Pain Scale: 0-10 Pain Score: 4  Pain Type: Surgical pain Pain Location: Leg Pain Orientation: Right Pain Descriptors / Indicators: Discomfort Pain Intervention(s): Repositioned;Emotional support Home Living/Prior Functioning Home Living Available Help at Discharge: Family, Available 24 hours/day Type of Home: Apartment Home Access: Stairs to enter [employer] of Steps: 17 Entrance Stairs-Rails: Right, Left Home Layout: One level Bathroom Shower/Tub: Optometrist: Yes  Lives With: Family IADL History Homemaking Responsibilities: No Current License: Yes Occupation:  Unemployed Type of Occupation: 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 just prior to this surgery Prior Function Level of Independence: Independent with basic ADLs  Able to Take Stairs?: Yes Driving: Yes ADL  See Function Section of chart for details Vision Baseline Vision/History: No visual deficits Patient Visual Report: No change from baseline  Cognition Overall Cognitive Status: Within Functional Limits for tasks assessed Arousal/Alertness: Awake/alert Orientation Level: Person;Place;Situation Person: Oriented Place: Oriented Situation: Oriented Year: 2019 Month: April Day of Week: Correct Memory: Appears intact Immediate Memory Recall: Sock;Blue;Bed Memory Recall: Blue;Bed;Sock Memory Recall Sock: Without Cue Memory Recall Blue: Without Cue Memory Recall Bed: Without Cue Attention: Sustained Sustained Attention: Appears intact Awareness: Appears intact Problem Solving: Appears intact Safety/Judgment: Appears intact Sensation Sensation Light Touch: Appears Intact Stereognosis: Appears Intact Hot/Cold: Appears Intact Proprioception: Appears Intact Additional Comments: Sensation intact in BUEs.  Reports neuropathy in his LEs Coordination Fine Motor Movements are Fluid and Coordinated: Yes Motor  Motor Motor: Within Functional Limits Motor - Skilled Clinical Observations: generalized weakness Mobility    See Function Section of chart for details  Trunk/Postural Assessment  Cervical Assessment Cervical Assessment: Within Functional Limits Thoracic Assessment Thoracic Assessment: Within Functional Limits Lumbar Assessment Lumbar Assessment: Within Functional Limits Postural Control Postural Control: Within Functional Limits  Balance Balance Balance Assessed: Yes Static Sitting Balance Static Sitting - Balance Support: No upper extremity supported Static Sitting - Level of Assistance: 6: Modified independent (Device/Increase time) Dynamic  Sitting Balance Dynamic Sitting - Balance Support: Feet supported Dynamic Sitting - Level of Assistance: 5: Stand by assistance Static Standing Balance Static Standing - Balance Support: Right upper extremity supported;Left upper extremity supported;During functional activity Static Standing - Level of Assistance: 5: Stand by assistance Dynamic Standing Balance Dynamic Standing - Balance Support: During functional activity Dynamic Standing - Level of Assistance: 4: Min assist Extremity/Trunk Assessment RUE Assessment RUE Assessment: Within  Functional Limits LUE Assessment LUE Assessment: Within Functional Limits   See Function Navigator for Current Functional Status.   Refer to Care Plan for Long Term Goals  Recommendations for other services: None    Discharge Criteria: Patient will be discharged from OT if patient refuses treatment 3 consecutive times without medical reason, if treatment goals not met, if there is a change in medical status, if patient makes no progress towards goals or if patient is discharged from hospital.  The above assessment, treatment plan, treatment alternatives and goals were discussed and mutually agreed upon: by patient  Vera Furniss OTR/L 09/04/2017, 11:12 AM

## 2017-09-04 NOTE — Progress Notes (Signed)
Physical Therapy Session Note  Patient Details  Name: Christopher Callahan MRN: [id number] Date of Birth: 07/17/1990  Today's Date: 09/04/2017 PT Individual Time: 1435-1500 PT Individual Time Calculation (min): 25 min   Short Term Goals: Week 1:  PT Short Term Goal 1 (Week 1): = LTGs  Skilled Therapeutic Interventions/Progress Updates: Pt received seated in w/c, denies pain at rest and agreeable to treatment. W/c propulsion to gym with S, slow speed. Squat pivot transfer w/c >mat table min guard with min cues for w/c setup and safety. Sit>supine>prone with S and assist for managing wound vac. Performed prone hip extension and hamstring curls BLE 2x15. Educated pt on RLE positioning in w/c, bed to reduce risk of hip flexion, external rotation precautions and goal of maintaining ROM/strength to prepare for prosthetic fitting. Supine crunches, trunk lateral flexion, bicycle crunches 2x20 each; educated pt on importance of core strength and activity tolerance for prosthetic training. Handoff to next therapist at end of session.   Therapy Documentation Precautions:  Precautions Precautions: Fall Precaution Comments: wound vac, R BKA Required Braces or Orthoses: Other Brace/Splint Other Brace/Splint: splint for R BK Restrictions Weight Bearing Restrictions: Yes RLE Weight Bearing: Non weight bearing   See Function Navigator for Current Functional Status.   Therapy/Group: Individual Therapy  Vista Lawmanlizabeth J Tygielski 09/04/2017, 3:45 PM

## 2017-09-04 NOTE — Progress Notes (Signed)
Ranelle OysterSwartz, Zachary T, MD  Physician  Physical Medicine and Rehabilitation  Consult Note  Signed  Date of Service:  08/29/2017 8:33 AM       Related encounter: ED to Hosp-Admission (Discharged) from 08/24/2017 in [facility]      Signed      Expand All Collapse All       [] Hide copied text  [] Hover for details        Physical Medicine and Rehabilitation Consult   Reason for Consult: R-BKA due to osteomyelitis and foot abscess.  Referring Physician: Dr. Hanley BenAlekh   HPI: Christopher Callahan is a 27 y.o. male with history of T1DM with neuropathy, tobacco use, chronic diabetic foot ulcers with worsening despite antibiotics. He was admitted on 08/24/17 with increase drainage, malaise with aches and pain. Patient reports taking medications x weeks and inability to manage (not acting right) at home [facility]  He was started on broad spectrum antibiotics for sepsis due to right foot osteomyelitis with abscess and IV insulin due to BS 856 as well as fluids for AKI. Dr. 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 Post op continues to have poorly controlled BS. Hyponatremia nad leucocytosis have resolved.  Therapy evaluation done and CIR recommended due to functional deficits.     Review of Systems  Constitutional: Negative for fever.  HENT: Negative for tinnitus.   Respiratory: Negative for cough.   Cardiovascular: Negative for palpitations.  Gastrointestinal: Negative for abdominal pain.  Musculoskeletal: Positive for joint pain and myalgias.  Skin: Negative for itching.  Neurological: Negative for headaches.  Psychiatric/Behavioral: Negative for substance abuse.          Past Medical History:  Diagnosis Date  . Diabetes mellitus without complication [facility](HCC)          Past Surgical History:  Procedure Laterality Date  . AMPUTATION Right 08/27/2017   Procedure: RIGHT BELOW KNEE AMPUTATION;  Surgeon: Nadara Mustarduda,  Marcus V, MD;  [facility];  Service: Orthopedics;  Laterality: Right;  . CHOLECYSTECTOMY    . SKIN GRAFT           Family History  Problem Relation Age of Onset  . COPD Father      Social History:  Single. 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 Now plans on staying with mother who works out of home. He reports that he has been smoking cigarettes.  He has been smoking about 0.30 packs per day. He has never used smokeless tobacco. He reports that he does not drink alcohol or use drugs.    Allergies: No Known Allergies    Medications Prior to Admission  Medication Sig Dispense Refill  . insulin aspart (NOVOLOG) 100 UNIT/ML FlexPen Inject 5 Units into the skin 3 (three) times daily.    . Insulin Detemir (LEVEMIR FLEXTOUCH) 100 UNIT/ML Pen Inject 24 Units into the skin 2 (two) times daily.    Marland Kitchen. dicyclomine (BENTYL) 20 MG tablet Take 1 tablet (20 mg total) by mouth 2 (two) times daily as needed for spasms (abdominal pain). (Patient not taking: Reported on 08/24/2017) 10 tablet 0  . metoCLOPramide (REGLAN) 5 MG tablet Take 1 tablet (5 mg total) by mouth every 8 (eight) hours as needed for nausea. (Patient not taking: Reported on 08/24/2017) 15 tablet 0  . promethazine (PHENERGAN) 25 MG tablet Take 1 tablet (25 mg total) by mouth every 6 (six) hours as needed for nausea or vomiting. (Patient not taking: Reported  on 08/24/2017) 30 tablet 0    Home: Home Living Family/patient expects to be discharged to:: Inpatient rehab  Functional History: Prior Function Level of Independence: Independent Comments: lives with his mother Functional Status:  Mobility: Bed Mobility Overal bed mobility: Independent Transfers Overall transfer level: Needs assistance Equipment used: Rolling walker (2 wheeled), 1 person hand held assist Transfers: Sit to/from Stand, Stand Pivot Transfers Sit to Stand: Min assist, Mod assist Stand pivot transfers: Min assist, Mod assist General transfer  comment: reminders for hand placement Ambulation/Gait Ambulation/Gait assistance: Min assist Ambulation Distance (Feet): 6 Feet Assistive device: Rolling walker (2 wheeled), 1 person hand held assist Gait Pattern/deviations: Step-through pattern General Gait Details: hops to L on RW for transition to chair. Gait velocity: reduced Gait velocity interpretation: Below normal speed for age/gender  ADL:  Cognition: Cognition Overall Cognitive Status: Within Functional Limits for tasks assessed Orientation Level: Oriented X4 Cognition Arousal/Alertness: Awake/alert Behavior During Therapy: WFL for tasks assessed/performed Overall Cognitive Status: Within Functional Limits for tasks assessed   Blood pressure 132/84, pulse 93, temperature 97.7 F (36.5 C), temperature source Oral, resp. rate 12, height 6\' 1"  (1.854 m), weight 106.6 kg (235 lb), SpO2 98 %. Physical Exam  Nursing note and vitals reviewed. Constitutional: He is oriented to person, place, and time. He appears well-developed and well-nourished. No distress.  HENT:  Head: Normocephalic and atraumatic.  Eyes: Pupils are equal, round, and reactive to light. Conjunctivae and EOM are normal.  Neck: Normal range of motion.  Cardiovascular: Normal rate and regular rhythm.  No murmur heard. Respiratory: Effort normal and breath sounds normal. No stridor. No respiratory distress. He has no wheezes.  GI: Soft. Bowel sounds are normal. He exhibits no distension. There is no tenderness.  Musculoskeletal:  Long well healed scar on dorsal surface of left foot and callused ulcer on plantar surface under great toe. Keeps RLE rotated outward with knee flexed. R-BKA site with compression sock and VAC in place.   Neurological: He is alert and oriented to person, place, and time.  Speech clear. Able to follow basic commands without difficulty. UE 5/5. LLE 4-5/5. Able to lift right leg against gravity  Skin: Skin is warm and dry. He is not  diaphoretic.  Psychiatric: He has a normal mood and affect. His behavior is normal. Thought content normal.             Assessment/Plan: Diagnosis: right BKA 1. Does the need for close, 24 hr/day medical supervision in concert with the patient's rehab needs make it unreasonable for this patient to be served in a less intensive setting? No 2. Co-Morbidities requiring supervision/potential complications: diabetes, osteomyelitis 3. Due to bladder management, bowel management, safety, skin/wound care, medication administration and pain management, does the patient require 24 hr/day rehab nursing? No and Potentially 4. Does the patient require coordinated care of a physician, rehab nurse, PT (1-2 hrs/day, 5 days/week) and OT (1-2 hrs/day, 5 days/week) to address physical and functional deficits in the context of the above medical diagnosis(es)? No and Potentially Addressing deficits in the following areas: balance, endurance, locomotion, strength, transferring, bowel/bladder control, bathing, dressing, feeding, grooming and toileting 5. Can the patient actively participate in an intensive therapy program of at least 3 hrs of therapy per day at least 5 days per week? Yes 6. The potential for patient to make measurable gains while on inpatient rehab is fair 7. Anticipated functional outcomes upon discharge from inpatient rehab are n/a  with PT, n/a with OT, n/a with  SLP. 8. Estimated rehab length of stay to reach the above functional goals is: n/a 9. Anticipated D/C setting: Home 10. Anticipated post D/C treatments: HH therapy 11. Overall Rehab/Functional Prognosis: good  RECOMMENDATIONS: This patient's condition is appropriate for continued rehabilitative care in the following setting: [facility] Therapy Patient has agreed to participate in recommended program. Yes Note that insurance prior authorization may be required for reimbursement for recommended care.  Comment:   Ranelle Oyster, MD,  [name] Physical Medicine & Rehabilitation 08/29/2017    Jacquelynn Cree, PA-C 08/29/2017          Revision History                        Routing History

## 2017-09-04 NOTE — Care Management Note (Signed)
Inpatient Rehabilitation Center Individual Statement of Services  Patient Name:  Christopher Callahan  Date:  09/04/2017  Welcome to the Inpatient Rehabilitation Center.  Our goal is to provide you with an individualized program based on your diagnosis and situation, designed to meet your specific needs.  With this comprehensive rehabilitation program, you will be expected to participate in at least 3 hours of rehabilitation therapies Monday-Friday, with modified therapy programming on the weekends.  Your rehabilitation program will include the following services:  Physical Therapy (PT), Occupational Therapy (OT), 24 hour per day rehabilitation nursing, Neuropsychology, Case Management (Social Worker), Rehabilitation Medicine, Nutrition Services and Pharmacy Services  Weekly team conferences will be held on Wednesday to discuss your progress.  Your Social Worker will talk with you frequently to get your input and to update you on team discussions.  Team conferences with you and your family in attendance may also be held.  Expected length of stay: 5-7 days  Overall anticipated outcome: mod/i level  Depending on your progress and recovery, your program may change. Your Social Worker will coordinate services and will keep you informed of any changes. Your Social Worker's name and contact numbers are listed  below.  The following services may also be recommended but are not provided by the Inpatient Rehabilitation Center:   Driving Evaluations  Home Health Rehabiltiation Services  Outpatient Rehabilitation Services  Vocational Rehabilitation   Arrangements will be made to provide these services after discharge if needed.  Arrangements include referral to agencies that provide these services.  Your insurance has been verified to be:  [employer] Your primary doctor is:  Jacquelynn Creengela Coleman  Pertinent information will be shared with your doctor and your insurance company.  Social Worker:  Dossie DerBecky Chevelle Durr,  SW [phone] or [phone]) [phone]  Information discussed with and copy given to patient by: Lucy Chrisupree, Jermery Caratachea G, 09/04/2017, 10:55 AM

## 2017-09-04 NOTE — Progress Notes (Signed)
Patient information reviewed and entered into eRehab system by Rayson Rando, RN, CRRN, PPS Coordinator.  Information including medical coding and functional independence measure will be reviewed and updated through discharge.    

## 2017-09-04 NOTE — Evaluation (Signed)
Physical Therapy Assessment and Plan  Patient Details  Name: Christopher Callahan MRN: [id number] Date of Birth: Sep 21, 1990  PT Diagnosis: Difficulty walking, Edema, Impaired sensation, Muscle weakness and Pain in joint Rehab Potential: Good ELOS: 5-7 days   Today's Date: 09/04/2017 PT Individual Time: 1300-1415 PT Individual Time Calculation (min): 75 min    Problem List:  Patient Active Problem List   Diagnosis Date Noted  . Unilateral complete BKA, right, sequela ([place])   . Poorly controlled type 2 diabetes mellitus with peripheral neuropathy ([place])   . Hyperkalemia   . AKI (acute kidney injury) ([place])   . S/P below knee amputation, right ([place]) 09/03/2017  . Drug induced constipation   . Tobacco abuse   . Benign essential HTN   . Type 2 diabetes mellitus with peripheral neuropathy (HCC)   . Acute blood loss anemia   . Post-operative pain   . Subacute osteomyelitis, right ankle and foot ([place])   . Diabetic foot infection ([place])   . Sepsis ([place])   . Severe sepsis ([place]) 08/24/2017  . Essential hypertension 09/04/2016  . Type 1 diabetes mellitus with diabetic autonomic neuropathy, with long-term current use of insulin ([place]) 09/03/2016  . Diabetic gastroparesis associated with type 1 diabetes mellitus ([place]) 09/03/2016    Past Medical History:  Past Medical History:  Diagnosis Date  . Diabetes mellitus without complication [facility])    Past Surgical History:  Past Surgical History:  Procedure Laterality Date  . AMPUTATION Right 08/27/2017   Procedure: RIGHT BELOW KNEE AMPUTATION;  Surgeon: Newt Minion, MD;  Location: [place];  Service: Orthopedics;  Laterality: Right;  . CHOLECYSTECTOMY    . SKIN GRAFT      Assessment & Plan Clinical Impression: Patient is a 27 y.o. right-handedmalewith history of T1DM with neuropathy, tobacco use, chronic diabetic foot ulcers with worsening despite antibiotics.  Per chart review and patient, patient lives with his mother.  One level home with  multiple steps.  Independent prior to admission.  He was admitted on 08/24/17 with ulceration of the right foot and increased drainage, malaise with aches and pain. Patient reports being maintained on oral antibiotics for wound care and inability to manage (not acting right) at home therefore moved by family to [facility].He was started on broad spectrumantibiotics for sepsis due to right foot osteomyelitis with abscess and IV insulin due to BS 856 as well as fluids for AKI. Dr. 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 Post op continues to have poorly controlled BS.  Pain management as directed.  Hyponatremia nad leucocytosis have resolved.  Acute blood loss anemia 10.5 and monitored.  Subcutaneous Lovenox for DVT prophylaxis.  MRSA PCR screening positive with contact precautions.Therapy evaluation done and CIR recommended due to functional deficits . Patient transferred to CIR on 09/03/2017 .   Patient currently requires min with mobility secondary to muscle weakness and muscle joint tightness, decreased cardiorespiratoy endurance and decreased sitting balance, decreased standing balance and decreased balance strategies.  Prior to hospitalization, patient was independent  with mobility and lived with Family in a [place] home.  Home access is 17Stairs to enter.  Patient will benefit from skilled PT intervention to maximize safe functional mobility, minimize fall risk and decrease caregiver burden for planned discharge home with 24 hour supervision.  Anticipate patient will benefit from follow up OP at discharge.  PT Assessment Rehab Potential (ACUTE/IP ONLY): Good PT Barriers to Discharge: Inaccessible home environment;Wound Care PT Barriers to Discharge Comments: 17 stairs to  enter apartment; wound vac PT Patient demonstrates impairments in the following area(s): Balance;Edema;Endurance;Motor;Pain;Skin Integrity;Sensory PT Transfers Functional Problem(s): Bed Mobility;Bed to  Chair;Car;Furniture;Floor PT Locomotion Functional Problem(s): Ambulation;Wheelchair Mobility;Stairs PT Plan PT Intensity: Minimum of 1-2 x/day ,45 to 90 minutes PT Frequency: 5 out of 7 days PT Duration Estimated Length of Stay: 5-7 days PT Treatment/Interventions: Ambulation/gait training;Balance/vestibular training;Community reintegration;Discharge planning;Disease management/prevention;DME/adaptive equipment instruction;Functional mobility training;Neuromuscular re-education;Pain management;Patient/family education;Psychosocial support;Skin care/wound management;Splinting/orthotics;Stair training;Therapeutic Activities;Therapeutic Exercise;UE/LE Strength taining/ROM;UE/LE Coordination activities;Wheelchair propulsion/positioning PT Transfers Anticipated Outcome(s): Mod I basic PT Locomotion Anticipated Outcome(s): mod I w/c amd household gait; min stairs PT Recommendation Recommendations for Other Services: Neuropsych consult Follow Up Recommendations: Outpatient PT Patient destination: Home Equipment Recommended: Wheelchair (measurements);Wheelchair cushion (measurements) Equipment Details: has RW delivered  Skilled Therapeutic Intervention Evaluation completed (see details above and below) with education on PT POC and goals and individual treatment initiated with focus on functional transfers with and without RW with cues for safety and hand placement/technique with steadying to min assist (with RW), education on densitization and phantom limb pain/sensation and importance of ROM in preparation for prosthesis, w/c mobility and parts management training for legrest management with cues during propulsion for efficiency, intro to stair negotiation (unable to attempt to hop up step with rails) but practiced transition from standing to step (min assist) and back up (mod assist) and pt able to bump up the 2 steps on bottom (demonstrated various techniques we can try in stairwell for safe transition  at the top of the stairs to prepare for home entry), gait training with RW x 40' with close supervision to steadying assist with focus on efficient technque, and introduction of HEP for LE strengthening and ROM including quad sets, SLR, knee flexion/extension, and sidelying hip abduction and extension x 10 reps each.    PT Evaluation Precautions/Restrictions Precautions Precautions: Fall Precaution Comments: wound vac, R BKA Restrictions Weight Bearing Restrictions: Yes RLE Weight Bearing: Non weight bearing Pain  Reports pain in residual limb - prefers to wait to take medication due to it making him sleepy Home Living/Prior Functioning Home Living Living Arrangements: Parent Available Help at Discharge: Family;Available 24 hours/day Type of Home: Apartment Home Access: Stairs to enter Entrance Stairs-Number of Steps: 17 Entrance Stairs-Rails: Right;Left Home Layout: One level  Lives With: Family Prior Function Level of Independence: Independent with basic ADLs;Independent with gait;Independent with transfers  Able to Take Stairs?: Yes Driving: Yes Cognition  WFL Sensation Sensation Light Touch: Impaired Detail Light Touch Impaired Details: Impaired LLE(neuropathy in L foot; phantom pain/sensation in RLE) Proprioception: Appears Intact Coordination Gross Motor Movements are Fluid and Coordinated: Yes Motor  Motor Motor: Other (comment)(generalized weakness)     Trunk/Postural Assessment  Cervical Assessment Cervical Assessment: Within Functional Limits Thoracic Assessment Thoracic Assessment: Within Functional Limits Lumbar Assessment Lumbar Assessment: Within Functional Limits Postural Control Postural Control: Within Functional Limits  Balance Balance Balance Assessed: Yes Static Sitting Balance Static Sitting - Level of Assistance: 6: Modified independent (Device/Increase time) Dynamic Sitting Balance Dynamic Sitting - Level of Assistance: 5: Stand by  assistance Static Standing Balance Static Standing - Level of Assistance: 5: Stand by assistance Dynamic Standing Balance Dynamic Standing - Level of Assistance: 4: Min assist Extremity Assessment   see OT eval for UE assessment   RLE Assessment RLE Assessment: Exceptions to [facility] RLE Strength RLE Overall Strength Comments: grossly 3+/5 and ROM WFL for residual limb LLE Assessment LLE Assessment: Within Functional Limits   See Function Navigator for Current Functional Status.   Refer  to Care Plan for Long Term Goals  Recommendations for other services: Neuropsych  Discharge Criteria: Patient will be discharged from PT if patient refuses treatment 3 consecutive times without medical reason, if treatment goals not met, if there is a change in medical status, if patient makes no progress towards goals or if patient is discharged from hospital.  The above assessment, treatment plan, treatment alternatives and goals were discussed and mutually agreed upon: by patient  Juanna Cao, PT, DPT  09/04/2017, 2:40 PM

## 2017-09-04 NOTE — Progress Notes (Signed)
Retta Diones, RN  Rehab Admission Coordinator  Physical Medicine and Rehabilitation  PMR Pre-admission  Signed  Date of Service:  09/01/2017 8:52 PM       Related encounter: ED to Hosp-Admission (Discharged) from 08/24/2017 in [place]            _0 Hide copied text  _1 Hover for details   PMR Admission Coordinator Pre-Admission Assessment  [name] is an 27 y.o., male MRN: [id number] DOB: [birth year] Height: _2  (185.4 cm) Weight: 106.6 kg (235 lb)                                                                                                                   Insurance Information HMO:     PPO: Yes     PCP:       IPA:       80/20:       OTHER:   PRIMARY: BCBS fed emp PPO      Policy#:  [id number]      Subscriber: Cecile Hearing - patient's father CM Name: A My      Phone#: [phone]     Fax#: [phone] Pre-Cert#: [id number] With update on 09/08/17    Employer:  Father works FT Benefits:  Phone #: [phone]     Name:  On line Eff. Date: 09/30/15     Deduct: $5500 (met $458.19)      Out of Pocket Max: $5500 (met $458.19)      Life Max: N/A CIR: $175 per day up to $875 per admission      SNF: No benefits Outpatient: 50 combined visits     Co-Pay: $30/visit Home Health: 25 visits per year      Co-Pay: $30/visit DME: 70%     Co-Pay: 30% Providers: in network  Emergency Contact Information         Contact Information    Name Relation Home Work [place] Mother [phone]     Valerie Salts [phone]       Current Medical History  Patient Admitting Diagnosis:  R BKA  History of Present Illness: A 27 y.o.right-handedmalewith history of T1DM with neuropathy, tobacco use, chronic diabetic foot ulcers with worsening despite antibiotics.Per chart review lives with his mother.  Indepedent prior to admission.He was admitted on 08/24/17 with increase drainage, malaise with  aches and pain. Patient reports taking medications x weeks and inability to manage (not acting right) at home therefore moved by family to [facility].He was started on broad spectrumantibiotics for sepsis due to right foot osteomyelitis with abscess and IV insulin due to BS 856 as well as fluids for AKI. Dr. 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 Post op continues to have poorly controlled BS. Hyponatremiaandleucocytosis have resolved.Acute blood loss anemia 10.5 and monitored. SQLovenox for DVT prophylaxis. MRSA PCR screening positive with contact precautions.Therapy evaluation done and CIR recommended due to functional deficits.Patient wto be admitted for  a comprehensive inpatient rehab program.  Past Medical History      Past Medical History:  Diagnosis Date  . Diabetes mellitus without complication (HCC)     Family History  family history includes COPD in his father.  Prior Rehab/Hospitalizations: No previous rehab.  Has the patient had major surgery during 100 days prior to admission? No  Current Medications   Current Facility-Administered Medications:  .  0.9 %  sodium chloride infusion, , Intravenous, Continuous, Newt Minion, MD .  acetaminophen (TYLENOL) tablet 325-650 mg, 325-650 mg, Oral, Q6H PRN, Newt Minion, MD, 650 mg at 08/29/17 0123 .  amLODipine (NORVASC) tablet 5 mg, 5 mg, Oral, Daily, Starla Link, Kshitiz, MD, 5 mg at 09/03/17 0833 .  bisacodyl (DULCOLAX) suppository 10 mg, 10 mg, Rectal, Daily PRN, Newt Minion, MD, 10 mg at 08/29/17 1939 .  docusate sodium (COLACE) capsule 100 mg, 100 mg, Oral, BID, Newt Minion, MD, 100 mg at 09/03/17 0814 .  enoxaparin (LOVENOX) injection 40 mg, 40 mg, Subcutaneous, Q24H, Newt Minion, MD, 40 mg at 09/02/17 1500 .  feeding supplement (GLUCERNA SHAKE) (GLUCERNA SHAKE) liquid 237 mL, 237 mL, Oral, TID BM, Alekh, Kshitiz, MD, 237 mL at 09/03/17 1305 .  hydrALAZINE (APRESOLINE) injection 5 mg, 5  mg, Intravenous, Q4H PRN, Newt Minion, MD, 5 mg at 08/26/17 1721 .  HYDROmorphone (DILAUDID) injection 0.5-1 mg, 0.5-1 mg, Intravenous, Q4H PRN, Newt Minion, MD, 1 mg at 08/29/17 2201 .  insulin aspart (novoLOG) injection 0-15 Units, 0-15 Units, Subcutaneous, TID WC, Newt Minion, MD, 2 Units at 09/03/17 [phone] .  insulin aspart (novoLOG) injection 0-5 Units, 0-5 Units, Subcutaneous, QHS, Newt Minion, MD, 3 Units at 08/28/17 2213 .  insulin aspart (novoLOG) injection 10 Units, 10 Units, Subcutaneous, TID WC, Alekh, Kshitiz, MD, 10 Units at 09/03/17 1304 .  insulin glargine (LANTUS) injection 25 Units, 25 Units, Subcutaneous, BID, Aline August, MD, 25 Units at 09/03/17 0835 .  lactated ringers infusion, , Intravenous, Continuous, Newt Minion, MD, Last Rate: 10 mL/hr at 08/27/17 2100 .  methocarbamol (ROBAXIN) tablet 500 mg, 500 mg, Oral, Q6H PRN, 500 mg at 08/29/17 1929 **OR** methocarbamol (ROBAXIN) 500 mg in dextrose 5 % 50 mL IVPB, 500 mg, Intravenous, Q6H PRN, Newt Minion, MD .  metoCLOPramide (REGLAN) tablet 5-10 mg, 5-10 mg, Oral, Q8H PRN **OR** metoCLOPramide (REGLAN) injection 5-10 mg, 5-10 mg, Intravenous, Q8H PRN, Newt Minion, MD .  metoprolol tartrate (LOPRESSOR) injection 2.5 mg, 2.5 mg, Intravenous, Q4H PRN, Newt Minion, MD, 2.5 mg at 08/26/17 1545 .  ondansetron (ZOFRAN) tablet 4 mg, 4 mg, Oral, Q6H PRN **OR** ondansetron (ZOFRAN) injection 4 mg, 4 mg, Intravenous, Q6H PRN, Newt Minion, MD .  oxyCODONE (Oxy IR/ROXICODONE) immediate release tablet 10-15 mg, 10-15 mg, Oral, Q4H PRN, Newt Minion, MD, 15 mg at 09/03/17 1304 .  oxyCODONE (Oxy IR/ROXICODONE) immediate release tablet 5-10 mg, 5-10 mg, Oral, Q4H PRN, Newt Minion, MD, 10 mg at 08/27/17 1407 .  polyethylene glycol (MIRALAX / GLYCOLAX) packet 17 g, 17 g, Oral, Daily PRN, Newt Minion, MD .  sodium chloride 0.9 % bolus 1,000 mL, 1,000 mL, Intravenous, Once, Newt Minion, MD .  sodium chloride flush  (NS) 0.9 % injection 3 mL, 3 mL, Intravenous, Q12H, Newt Minion, MD, 3 mL at 08/31/17 0929 .  zolpidem (AMBIEN) tablet 5 mg, 5 mg, Oral, QHS PRN, Starla Link, Kshitiz, MD, 5 mg at 09/02/17 2310  Patients Current Diet: Diet Carb Modified Fluid consistency: Thin; Room service appropriate? Yes Diet - low sodium heart healthy Diet Carb Modified  Precautions / Restrictions Precautions Precautions: Fall Precaution Comments: wound vac, R BKA Other Brace/Splint: splint for R BK Restrictions Weight Bearing Restrictions: Yes   Has the patient had 2 or more falls or a fall with injury in the past year?No  Prior Activity Level Community (5-7x/wk): Worked FT as a bar tender, was driving.  Home Assistive Devices / Equipment Home Equipment: None  Prior Device Use: Indicate devices/aids used by the patient prior to current illness, exacerbation or injury? None  Prior Functional Level Prior Function Level of Independence: Independent Comments: lives with his mother  Self Care: Did the patient need help bathing, dressing, using the toilet or eating?  Independent  Indoor Mobility: Did the patient need assistance with walking from room to room (with or without device)? Independent  Stairs: Did the patient need assistance with internal or external stairs (with or without device)? Independent  Functional Cognition: Did the patient need help planning regular tasks such as shopping or remembering to take medications? Independent  Current Functional Level Cognition  Overall Cognitive Status: Within Functional Limits for tasks assessed Orientation Level: Oriented X4 General Comments: calmer mood and was talking with family when PT arrived    Extremity Assessment (includes Sensation/Coordination)  Upper Extremity Assessment: RUE deficits/detail, LUE deficits/detail RUE Deficits / Details: 4+/5 LUE Deficits / Details: 4+/5  Lower Extremity Assessment: Defer to PT evaluation RLE  Deficits / Details: new R BK amputation RLE Coordination: decreased fine motor, decreased gross motor    ADLs  Overall ADL's : Needs assistance/impaired Eating/Feeding: Independent, Bed level Grooming: Wash/dry hands, Wash/dry face, Oral care, Sitting, Set up Upper Body Bathing: Set up, Sitting Lower Body Bathing: Minimal assistance, Sitting/lateral leans Upper Body Dressing : Set up, Sitting Lower Body Dressing: Minimal assistance, Sitting/lateral leans Toilet Transfer: Minimal assistance, Stand-pivot, RW Toileting- Clothing Manipulation and Hygiene: Minimal assistance, Sitting/lateral lean General ADL Comments: Educated pt in compensatory strategies for ADL.    Mobility  Overal bed mobility: Modified Independent General bed mobility comments: HOB up    Transfers  Overall transfer level: Needs assistance Equipment used: Rolling walker (2 wheeled), 1 person hand held assist Transfers: Sit to/from Stand Sit to Stand: Min guard, Min assist Stand pivot transfers: Min guard General transfer comment: minor help to steady with initial standing    Ambulation / Gait / Stairs / Wheelchair Mobility  Ambulation/Gait Ambulation/Gait assistance: Physicist, medical (Feet): 45 Feet Assistive device: Rolling walker (2 wheeled) Gait Pattern/deviations: Step-to pattern, Trunk flexed, Shuffle, Decreased stride length General Gait Details: improved heel to toe on LLE, smoother and able to maneuver walker around obstacles in his room Gait velocity: reduced Gait velocity interpretation: Below normal speed for age/gender    Posture / Balance Balance Overall balance assessment: Needs assistance Sitting-balance support: Feet supported Sitting balance-Leahy Scale: Good Standing balance support: Bilateral upper extremity supported, During functional activity Standing balance-Leahy Scale: Poor Standing balance comment: UE suppor due to BKA RLE    Special needs/care consideration  BiPAP/CPAP No CPM No Continuous Drip IV KVO Dialysis No       Life Vest No Oxygen No Special Bed No Trach Size No Wound Vac (area) Yes, post op right BKA site with VAC in place     Skin New post op Right BKA site with VAC in place  Bowel mgmt: No BM since 08/24/17 Bladder mgmt: Voiding in urinal Diabetic mgmt Yes, on insulin at home    Previous Home Environment Living Arrangements: Parent Available Help at Discharge: Family, Available 24 hours/day(mom works from home) Type of Home: [place]: One level Home Access: Stairs to enter Entrance Stairs-Rails: Right, Left Entrance Stairs-Number of Steps: 17 Bathroom Shower/Tub: Chiropodist: Standard  Discharge Living Setting Plans for Discharge Living Setting: Lives with (comment), Apartment(Lives with his mother.) Type of Home at Discharge: Apartment(2nd floor apartment) Discharge Home Layout: One level(apartment is on the 2nd level.) Discharge Home Access: Stairs to enter Entrance Stairs-Number of Steps: 10-12 steps Does the patient have any problems obtaining your medications?: No  Social/Family/Support Systems Patient Roles: Other (Comment)(Has a mother.) Contact Information: Augustin Schooling - mother - [phone] Anticipated Caregiver: Mom can assist, she works from home Ability/Limitations of Caregiver: Mom works from home and can assist Caregiver Availability: 24/7 Discharge Plan Discussed with Primary Caregiver: Yes Is Caregiver In Agreement with Plan?: Yes Does Caregiver/Family have Issues with Lodging/Transportation while Pt is in Rehab?: No  Goals/Additional Needs Patient/Family Goal for Rehab: PT/OT mod I goals Expected length of stay: 7-10 days Cultural Considerations: None Dietary Needs: Carb mod, med calorie, thin liquids Equipment Needs: TBD Pt/Family Agrees to Admission and willing to participate: Yes Program Orientation Provided & Reviewed with  Pt/Caregiver Including Roles  & Responsibilities: Yes  Decrease burden of Care through IP rehab admission: N/A  Possible need for SNF placement upon discharge: Not planned  Patient Condition: This patient's medical and functional status has changed since the consult dated: 08/29/17 in which the Rehabilitation Physician determined and documented that the patient's condition is appropriate for intensive rehabilitative care in an inpatient rehabilitation facility. See "History of Present Illness" (above) for medical update. Functional changes are: Currently requiring min guard assist for transfers and min guard assist to ambulate 45 feet RW. Patient's medical and functional status update has been discussed with the Rehabilitation physician and patient remains appropriate for inpatient rehabilitation. Will admit to inpatient rehab today.  Preadmission Screen Completed By:  Retta Diones, 09/03/2017 4:51 PM ______________________________________________________________________   Discussed status with Dr. Posey Pronto on 09/03/17 at 1648 and received telephone approval for admission today.  Admission Coordinator:  Retta Diones, time 1648/Date 09/03/17             Cosigned by: Jamse Arn, MD at 09/03/2017 4:52 PM  Revision History

## 2017-09-04 NOTE — Progress Notes (Signed)
Physical Therapy Session Note  Patient Details  Name: Trevor Macentonio Polcyn MRN: [id number] Date of Birth: 04-29-1991  Today's Date: 09/04/2017 PT Individual Time: 1500-1530 PT Individual Time Calculation (min): 30 min   Short Term Goals: Week 1:  PT Short Term Goal 1 (Week 1): = LTGs  Skilled Therapeutic Interventions/Progress Updates:  Handoff from PT. Completed gait on uneven surface x 10' with min assist for balance with RW for community mobility simulation. Instructed in UE strengthening exercises for functional strengthening to aid with mobility including bicep curls (5# dumbbells), overhead presses (5# dumbbells), and triceps press (9#) x15 reps each x 2 sets. Pt performed squat/stand pivot transfers with close supervision to steadying assist and propelled back to room with supervision.   Therapy Documentation Precautions:  Precautions Precautions: Fall Precaution Comments: wound vac, R BKA Required Braces or Orthoses: Other Brace/Splint Other Brace/Splint: splint for R BK Restrictions Weight Bearing Restrictions: Yes RLE Weight Bearing: Non weight bearing   Pain:  RN notified for pain medication at end of session for residual limb.   See Function Navigator for Current Functional Status.   Therapy/Group: Individual Therapy  Karolee StampsGray, Caspar Favila Darrol PokeBrescia  Demetrio Leighty B. Saylor Sheckler, PT, DPT  09/04/2017, 3:34 PM

## 2017-09-04 NOTE — Progress Notes (Signed)
Zuleima Haser M, RN  Rehab Admission Coordinator  Physical Medicine and Rehabilitation  PMR Pre-admission  Signed  Date of Service:  09/01/2017 8:52 PM       Related encounter: ED to Hosp-Admission (Discharged) from 08/24/2017 in [facility]      Signed            []Hide copied text  []Hover for details   PMR Admission Coordinator Pre-Admission Assessment  [name] is an 26 y.o., male MRN: [id number] DOB: 12/13/1990 Height: 6' 1" (185.4 cm) Weight: 106.6 kg (235 lb)                                                                                                                   Insurance Information HMO:     PPO: Yes     PCP:       IPA:       80/20:       OTHER:   PRIMARY: BCBS fed emp PPO      Policy#:  [id number]      Subscriber: Christopher Callahan - patient's father CM Name: A My      Phone#: [phone]     Fax#: [phone] Pre-Cert#: [id number] With update on 09/08/17    Employer:  Father works FT Benefits:  Phone #: [phone]     Name:  On line Eff. Date: 09/30/15     Deduct: $5500 (met $458.19)      Out of Pocket Max: $5500 (met $458.19)      Life Max: N/A CIR: $175 per day up to $875 per admission      SNF: No benefits Outpatient: 50 combined visits     Co-Pay: $30/visit Home Health: 25 visits per year      Co-Pay: $30/visit DME: 70%     Co-Pay: 30% Providers: in network  Emergency Contact Information         Contact Information    Name Relation Home Work Mobile   Kellin,Kitina Mother [phone]     Brown,Margeut Grandmother [phone]       Current Medical History  Patient Admitting Diagnosis:  R BKA  History of Present Illness: A 26 y.o.right-handedmalewith history of T1DM with neuropathy, tobacco use, chronic diabetic foot ulcers with worsening despite antibiotics.Per chart review lives with his mother.  Indepedent prior to admission.He was admitted on 08/24/17 with increase drainage, malaise with  aches and pain. Patient reports taking medications x weeks and inability to manage (not acting right) at home therefore moved by family to [place].He was started on broad spectrumantibiotics for sepsis due to right foot osteomyelitis with abscess and IV insulin due to BS 856 as well as fluids for AKI. Dr. 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 Post op continues to have poorly controlled BS. Hyponatremiaandleucocytosis have resolved.Acute blood loss anemia 10.5 and monitored. SQLovenox for DVT prophylaxis. MRSA PCR screening positive with contact precautions.Therapy evaluation done and CIR recommended due to functional deficits.Patient wto be admitted for   a comprehensive inpatient rehab program.  Past Medical History      Past Medical History:  Diagnosis Date  . Diabetes mellitus without complication (HCC)     Family History  family history includes COPD in his father.  Prior Rehab/Hospitalizations: No previous rehab.  Has the patient had major surgery during 100 days prior to admission? No  Current Medications   Current Facility-Administered Medications:  .  0.9 %  sodium chloride infusion, , Intravenous, Continuous, Newt Minion, MD .  acetaminophen (TYLENOL) tablet 325-650 mg, 325-650 mg, Oral, Q6H PRN, Newt Minion, MD, 650 mg at 08/29/17 0123 .  amLODipine (NORVASC) tablet 5 mg, 5 mg, Oral, Daily, Starla Link, Kshitiz, MD, 5 mg at 09/03/17 0833 .  bisacodyl (DULCOLAX) suppository 10 mg, 10 mg, Rectal, Daily PRN, Newt Minion, MD, 10 mg at 08/29/17 1939 .  docusate sodium (COLACE) capsule 100 mg, 100 mg, Oral, BID, Newt Minion, MD, 100 mg at 09/03/17 0814 .  enoxaparin (LOVENOX) injection 40 mg, 40 mg, Subcutaneous, Q24H, Newt Minion, MD, 40 mg at 09/02/17 1500 .  feeding supplement (GLUCERNA SHAKE) (GLUCERNA SHAKE) liquid 237 mL, 237 mL, Oral, TID BM, Alekh, Kshitiz, MD, 237 mL at 09/03/17 1305 .  hydrALAZINE (APRESOLINE) injection 5 mg, 5  mg, Intravenous, Q4H PRN, Newt Minion, MD, 5 mg at 08/26/17 1721 .  HYDROmorphone (DILAUDID) injection 0.5-1 mg, 0.5-1 mg, Intravenous, Q4H PRN, Newt Minion, MD, 1 mg at 08/29/17 2201 .  insulin aspart (novoLOG) injection 0-15 Units, 0-15 Units, Subcutaneous, TID WC, Newt Minion, MD, 2 Units at 09/03/17 [phone] .  insulin aspart (novoLOG) injection 0-5 Units, 0-5 Units, Subcutaneous, QHS, Newt Minion, MD, 3 Units at 08/28/17 2213 .  insulin aspart (novoLOG) injection 10 Units, 10 Units, Subcutaneous, TID WC, Alekh, Kshitiz, MD, 10 Units at 09/03/17 1304 .  insulin glargine (LANTUS) injection 25 Units, 25 Units, Subcutaneous, BID, Aline August, MD, 25 Units at 09/03/17 0835 .  lactated ringers infusion, , Intravenous, Continuous, Newt Minion, MD, Last Rate: 10 mL/hr at 08/27/17 2100 .  methocarbamol (ROBAXIN) tablet 500 mg, 500 mg, Oral, Q6H PRN, 500 mg at 08/29/17 1929 **OR** methocarbamol (ROBAXIN) 500 mg in dextrose 5 % 50 mL IVPB, 500 mg, Intravenous, Q6H PRN, Newt Minion, MD .  metoCLOPramide (REGLAN) tablet 5-10 mg, 5-10 mg, Oral, Q8H PRN **OR** metoCLOPramide (REGLAN) injection 5-10 mg, 5-10 mg, Intravenous, Q8H PRN, Newt Minion, MD .  metoprolol tartrate (LOPRESSOR) injection 2.5 mg, 2.5 mg, Intravenous, Q4H PRN, Newt Minion, MD, 2.5 mg at 08/26/17 1545 .  ondansetron (ZOFRAN) tablet 4 mg, 4 mg, Oral, Q6H PRN **OR** ondansetron (ZOFRAN) injection 4 mg, 4 mg, Intravenous, Q6H PRN, Newt Minion, MD .  oxyCODONE (Oxy IR/ROXICODONE) immediate release tablet 10-15 mg, 10-15 mg, Oral, Q4H PRN, Newt Minion, MD, 15 mg at 09/03/17 1304 .  oxyCODONE (Oxy IR/ROXICODONE) immediate release tablet 5-10 mg, 5-10 mg, Oral, Q4H PRN, Newt Minion, MD, 10 mg at 08/27/17 1407 .  polyethylene glycol (MIRALAX / GLYCOLAX) packet 17 g, 17 g, Oral, Daily PRN, Newt Minion, MD .  sodium chloride 0.9 % bolus 1,000 mL, 1,000 mL, Intravenous, Once, Newt Minion, MD .  sodium chloride flush  (NS) 0.9 % injection 3 mL, 3 mL, Intravenous, Q12H, Newt Minion, MD, 3 mL at 08/31/17 0929 .  zolpidem (AMBIEN) tablet 5 mg, 5 mg, Oral, QHS PRN, Starla Link, Kshitiz, MD, 5 mg at 09/02/17 2310  Patients Current Diet: Diet Carb Modified Fluid consistency: Thin; Room service appropriate? Yes Diet - low sodium heart healthy Diet Carb Modified  Precautions / Restrictions Precautions Precautions: Fall Precaution Comments: wound vac, R BKA Other Brace/Splint: splint for R BK Restrictions Weight Bearing Restrictions: Yes   Has the patient had 2 or more falls or a fall with injury in the past year?No  Prior Activity Level Community (5-7x/wk): Worked FT as a bar tender, was driving.  Home Assistive Devices / Equipment Home Equipment: None  Prior Device Use: Indicate devices/aids used by the patient prior to current illness, exacerbation or injury? None  Prior Functional Level Prior Function Level of Independence: Independent Comments: lives with his mother  Self Care: Did the patient need help bathing, dressing, using the toilet or eating?  Independent  Indoor Mobility: Did the patient need assistance with walking from room to room (with or without device)? Independent  Stairs: Did the patient need assistance with internal or external stairs (with or without device)? Independent  Functional Cognition: Did the patient need help planning regular tasks such as shopping or remembering to take medications? Independent  Current Functional Level Cognition  Overall Cognitive Status: Within Functional Limits for tasks assessed Orientation Level: Oriented X4 General Comments: calmer mood and was talking with family when PT arrived    Extremity Assessment (includes Sensation/Coordination)  Upper Extremity Assessment: RUE deficits/detail, LUE deficits/detail RUE Deficits / Details: 4+/5 LUE Deficits / Details: 4+/5  Lower Extremity Assessment: Defer to PT evaluation RLE  Deficits / Details: new R BK amputation RLE Coordination: decreased fine motor, decreased gross motor    ADLs  Overall ADL's : Needs assistance/impaired Eating/Feeding: Independent, Bed level Grooming: Wash/dry hands, Wash/dry face, Oral care, Sitting, Set up Upper Body Bathing: Set up, Sitting Lower Body Bathing: Minimal assistance, Sitting/lateral leans Upper Body Dressing : Set up, Sitting Lower Body Dressing: Minimal assistance, Sitting/lateral leans Toilet Transfer: Minimal assistance, Stand-pivot, RW Toileting- Clothing Manipulation and Hygiene: Minimal assistance, Sitting/lateral lean General ADL Comments: Educated pt in compensatory strategies for ADL.    Mobility  Overal bed mobility: Modified Independent General bed mobility comments: HOB up    Transfers  Overall transfer level: Needs assistance Equipment used: Rolling walker (2 wheeled), 1 person hand held assist Transfers: Sit to/from Stand Sit to Stand: Min guard, Min assist Stand pivot transfers: Min guard General transfer comment: minor help to steady with initial standing    Ambulation / Gait / Stairs / Wheelchair Mobility  Ambulation/Gait Ambulation/Gait assistance: Physicist, medical (Feet): 45 Feet Assistive device: Rolling walker (2 wheeled) Gait Pattern/deviations: Step-to pattern, Trunk flexed, Shuffle, Decreased stride length General Gait Details: improved heel to toe on LLE, smoother and able to maneuver walker around obstacles in his room Gait velocity: reduced Gait velocity interpretation: Below normal speed for age/gender    Posture / Balance Balance Overall balance assessment: Needs assistance Sitting-balance support: Feet supported Sitting balance-Leahy Scale: Good Standing balance support: Bilateral upper extremity supported, During functional activity Standing balance-Leahy Scale: Poor Standing balance comment: UE suppor due to BKA RLE    Special needs/care consideration  BiPAP/CPAP No CPM No Continuous Drip IV KVO Dialysis No       Life Vest No Oxygen No Special Bed No Trach Size No Wound Vac (area) Yes, post op right BKA site with VAC in place     Skin New post op Right BKA site with VAC in place  Bowel mgmt: No BM since 08/24/17 Bladder mgmt: Voiding in urinal Diabetic mgmt Yes, on insulin at home    Previous Home Environment Living Arrangements: Parent Available Help at Discharge: Family, Available 24 hours/day(mom works from home) Type of Home: [place]: One level Home Access: Stairs to enter Entrance Stairs-Rails: Right, Left Entrance Stairs-Number of Steps: 17 Bathroom Shower/Tub: Chiropodist: Standard  Discharge Living Setting Plans for Discharge Living Setting: Lives with (comment), Apartment(Lives with his mother.) Type of Home at Discharge: Apartment(2nd floor apartment) Discharge Home Layout: One level(apartment is on the 2nd level.) Discharge Home Access: Stairs to enter Entrance Stairs-Number of Steps: 10-12 steps Does the patient have any problems obtaining your medications?: No  Social/Family/Support Systems Patient Roles: Other (Comment)(Has a mother.) Contact Information: Augustin Schooling - mother - [phone] Anticipated Caregiver: Mom can assist, she works from home Ability/Limitations of Caregiver: Mom works from home and can assist Caregiver Availability: 24/7 Discharge Plan Discussed with Primary Caregiver: Yes Is Caregiver In Agreement with Plan?: Yes Does Caregiver/Family have Issues with Lodging/Transportation while Pt is in Rehab?: No  Goals/Additional Needs Patient/Family Goal for Rehab: PT/OT mod I goals Expected length of stay: 7-10 days Cultural Considerations: None Dietary Needs: Carb mod, med calorie, thin liquids Equipment Needs: TBD Pt/Family Agrees to Admission and willing to participate: Yes Program Orientation Provided & Reviewed with  Pt/Caregiver Including Roles  & Responsibilities: Yes  Decrease burden of Care through IP rehab admission: N/A  Possible need for SNF placement upon discharge: Not planned  Patient Condition: This patient's medical and functional status has changed since the consult dated: 08/29/17 in which the Rehabilitation Physician determined and documented that the patient's condition is appropriate for intensive rehabilitative care in an inpatient rehabilitation facility. See "History of Present Illness" (above) for medical update. Functional changes are: Currently requiring min guard assist for transfers and min guard assist to ambulate 45 feet RW. Patient's medical and functional status update has been discussed with the Rehabilitation physician and patient remains appropriate for inpatient rehabilitation. Will admit to inpatient rehab today.  Preadmission Screen Completed By:  Retta Diones, 09/03/2017 4:51 PM ______________________________________________________________________   Discussed status with Dr. Posey Pronto on 09/03/17 at 1648 and received telephone approval for admission today.  Admission Coordinator:  Retta Diones, time 1648/Date 09/03/17             Cosigned by: Jamse Arn, MD at 09/03/2017 4:52 PM  Revision History

## 2017-09-04 NOTE — Progress Notes (Signed)
[facility] PHYSICAL MEDICINE & REHABILITATION     PROGRESS NOTE  Subjective/Complaints:  Patient seen up in bed this morning. He states he slept well overnight. He states he is ready to begin therapies.  ROS: denies CP, SOB, nausea, vomiting, diarrhea.  Objective: Vital Signs: Blood pressure 124/85, pulse 98, temperature 98.7 F (37.1 C), temperature source Oral, resp. rate 16, height 6\' 1"  (1.854 m), weight 102 kg (224 lb 13.9 oz), SpO2 100 %. No results found. Recent Labs    09/03/17 1909 09/04/17 0640  WBC 10.3 7.1  HGB 11.2* 10.6*  HCT 34.0* 32.0*  PLT 507* 464*   Recent Labs    09/03/17 1909 09/04/17 0640  NA  --  135  K  --  5.4*  CL  --  101  GLUCOSE  --  166*  BUN  --  24*  CREATININE 1.40* 1.32*  CALCIUM  --  8.7*   CBG (last 3)  Recent Labs    09/03/17 1652 09/03/17 2127 09/04/17 0701  GLUCAP 160* 93 135*    Wt Readings from Last 3 Encounters:  09/03/17 102 kg (224 lb 13.9 oz)  08/27/17 106.6 kg (235 lb)  09/02/16 100.2 kg (221 lb)    Physical Exam:  BP 124/85   Pulse 98   Temp 98.7 F (37.1 C) (Oral)   Resp 16   Ht 6\' 1"  (1.854 m)   Wt 102 kg (224 lb 13.9 oz)   SpO2 100%   BMI 29.67 kg/m  Constitutional: He appears well-developed. Obese  HENT: Normocephalic and atraumatic.  Eyes: EOM are normal. No discharge.  Cardiovascular: Normal rate, regular rhythm and no JVD. Respiratory: Effort normal and breath sounds normal.  GI: Bowel sounds are normal. He exhibits no distension.  Musculoskeletal: RLE stump with tenderness  Neurological: He is alert and oriented.  Speech clear.  Able to follow basic commands without difficulty.  Motor: B/l UE 5/5.  LLE 4-/5 HF, 4/5 KE, ADF.  RLE: HF 4/5  Skin: Right BKA site is dressed with wound VAC in place.  Patient with well-healed scar on dorsal surface of left foot and callus ulcer on plantar surface under great toe.   Assessment/Plan: 1. Functional deficits secondary to right BKA which require 3+  hours per day of interdisciplinary therapy in a comprehensive inpatient rehab setting. Physiatrist is providing close team supervision and 24 hour management of active medical problems listed below. Physiatrist and rehab team continue to assess barriers to discharge/monitor patient progress toward functional and medical goals.  Function:  Bathing Bathing position      Bathing parts      Bathing assist        Upper Body Dressing/Undressing Upper body dressing                    Upper body assist        Lower Body Dressing/Undressing Lower body dressing                                  Lower body assist        Toileting Toileting          Toileting assist     Transfers Chair/bed Optician, dispensing          Cognition Comprehension  Comprehension assist level: Follows complex conversation/direction with no assist  Expression Expression assist level: Expresses complex ideas: With no assist  Social Interaction    Problem Solving Problem solving assist level: Solves complex problems: Recognizes & self-corrects  Memory Memory assist level: Complete Independence: No helper    Medical Problem List and Plan: 1.  Decreased functional mobility secondary to right BKA 08/27/2017   Begin CIR 2.  DVT Prophylaxis/Anticoagulation: Subcutaneous Lovenox.  Monitor for any bleeding episodes 3. Pain Management: Robaxin and oxycodone as needed 4. Mood: Provide emotional support 5. Neuropsych: This patient is capable of making decisions on his own behalf. 6. Skin/Wound Care: Routine skin checks   Will DC VAC today 7. Fluids/Electrolytes/Nutrition: Routine I/O's  8.  Acute blood loss anemia.     Hemoglobin 10.6 on 4/4   Cont to monitor 9.  Diabetes mellitus with peripheral neuropathy.  Hemoglobin A1c 11.1.  NovoLog 10 units 3 times daily, Lantus insulin 25 units twice daily.  Check blood sugars before meals  and at bedtime.  Diabetic teaching  Monitor with increased mobility 10.  Hypertension.  Norvasc 5 mg daily.  Monitor with increased mobility 11.  MRSA PCR screen positive.  Contact precautions 12.  Tobacco abuse.  Counseling 13.  Constipation.  Laxative assistance 14. Hyperkalemia   Labs ordered for tomorrow 15. AKI   Cr 1.32 on 4/4   Encourage fluids  LOS (Days) 1 A FACE TO FACE EVALUATION WAS PERFORMED  Ankit Karis Jubanil Patel 09/04/2017 8:49 AM

## 2017-09-05 ENCOUNTER — Inpatient Hospital Stay (HOSPITAL_COMMUNITY): Payer: Federal, State, Local not specified - PPO | Admitting: Physical Therapy

## 2017-09-05 ENCOUNTER — Inpatient Hospital Stay (HOSPITAL_COMMUNITY): Payer: Federal, State, Local not specified - PPO | Admitting: Occupational Therapy

## 2017-09-05 LAB — BASIC METABOLIC PANEL
Anion gap: 12 (ref 5–15)
BUN: 21 mg/dL — ABNORMAL HIGH (ref 6–20)
CO2: 24 mmol/L (ref 22–32)
Calcium: 9.1 mg/dL (ref 8.9–10.3)
Chloride: 98 mmol/L — ABNORMAL LOW (ref 101–111)
Creatinine, Ser: 1.15 mg/dL (ref 0.61–1.24)
GFR calc Af Amer: >60 mL/min (ref >60)
GFR calc non Af Amer: >60 mL/min (ref >60)
Glucose, Bld: 163 mg/dL — ABNORMAL HIGH (ref 65–99)
Potassium: 4.8 mmol/L (ref 3.5–5.1)
Sodium: 134 mmol/L — ABNORMAL LOW (ref 135–145)

## 2017-09-05 LAB — GLUCOSE, CAPILLARY
Glucose-Capillary: 160 mg/dL — ABNORMAL HIGH (ref 65–99)
Glucose-Capillary: 66 mg/dL (ref 65–99)
Glucose-Capillary: 120 mg/dL — ABNORMAL HIGH (ref 65–99)
Glucose-Capillary: 133 mg/dL — ABNORMAL HIGH (ref 65–99)

## 2017-09-05 MED ORDER — INSULIN GLARGINE 100 UNIT/ML ~~LOC~~ SOLN
25.0000 [IU] | Freq: Every day | SUBCUTANEOUS | Status: DC
  Administered 2017-09-05 – 2017-09-10 (×6): 25 [IU] via SUBCUTANEOUS
  Filled 2017-09-05 (×6): qty 0.25

## 2017-09-05 NOTE — Progress Notes (Signed)
Physical Therapy Session Note  Patient Details  Name: Christopher Callahan MRN: [id number] Date of Birth: 01/20/1991  Today's Date: 09/05/2017 PT Individual Time: 0800-0900 PT Individual Time Calculation (min): 60 min   Short Term Goals: Week 1:  PT Short Term Goal 1 (Week 1): = LTGs  Skilled Therapeutic Interventions/Progress Updates:    Pt seated EOB, agreeable to participate in therapy session. Pt reports minimal pain in R residual limb, not rated. Stand pivot transfer bed to w/c with CGA for steadying. Pt performs ADLs at sink with setup assist. Manual w/c propulsion 2 x 150 ft with BUE with Supervision. Floor transfer to/from mat with CGA to min assist. Floor transfer to/from w/c with min assist to lower, min assist to come back to 6" step then from 6" step to w/c. Ambulation 2 x 30 ft with RW and CGA. Seated BLE therex x 10 reps: marches, LAQ, hip add squeeze. Pt left seated EOB at end of therapy session with needs in reach.  Therapy Documentation Precautions:  Precautions Precautions: Fall Precaution Comments: wound vac, R BKA Required Braces or Orthoses: Other Brace/Splint Other Brace/Splint: splint for R BK Restrictions Weight Bearing Restrictions: Yes RLE Weight Bearing: Non weight bearing  See Function Navigator for Current Functional Status.   Therapy/Group: Individual Therapy   Peter Congoaylor Lemario Chaikin, PT, DPT  09/05/2017, 9:08 AM

## 2017-09-05 NOTE — Progress Notes (Signed)
Physical Therapy Session Note  Patient Details  Name: Christopher Callahan MRN: [id number] Date of Birth: [birth year]  Today's Date: 09/05/2017 PT Individual Time: 1400-1430 PT Individual Time Calculation (min): 30 min   Short Term Goals: Week 1:  PT Short Term Goal 1 (Week 1): = LTGs  Skilled Therapeutic Interventions/Progress Updates: Pt received seated on EOB, denies pain and agreeable to treatment. Pt dons RLE limb guard with min cues. Stand pivot transfer bed>w/c, w/c <>mat table with close S. Requires increased time to manage leg rests and cues for technique. Sit <>supine<>prone on mat modI. Performed BLE hip extension 2x15 reps. Prone thoracic extension 2x15 reps. 2x15 reps swimmers contralateral UE/LE lifts. Returned to w/c as above, modI w/c propulsion to return to room. Remained in w/c, all needs in reach at end of session.      Therapy Documentation Precautions:  Precautions Precautions: Fall Precaution Comments: R BKA Required Braces or Orthoses: Other Brace/Splint Other Brace/Splint: splint for R BK Restrictions Weight Bearing Restrictions: Yes RLE Weight Bearing: Non weight bearing   See Function Navigator for Current Functional Status.   Therapy/Group: Individual Therapy  Vista Lawmanlizabeth J Tygielski 09/05/2017, 2:24 PM

## 2017-09-05 NOTE — Progress Notes (Signed)
Occupational Therapy Session Note  Patient Details  Name: Christopher Callahan MRN: [id number] Date of Birth: 1991-05-17  Today's Date: 09/05/2017 OT Individual Time: 1446-1531 OT Individual Time Calculation (min): 45 min    Short Term Goals: Week 1:  OT Short Term Goal 1 (Week 1): STGs equal to LTGs based on ELOS  Skilled Therapeutic Interventions/Progress Updates:    Began session with pt rolling the wheelchair down to the ortho gym with supervision.  Therapist made modifications to pt's limb guard as it was contacting his residual limb at the distal end and needed to be trimmed.  Therapist trimmed the end to allow for a more comfortable fit without digging into the limb.  Once finished, had pt roll himself to the therapy gym where he worked on standing balance while alternating UE use to complete PVC pipe puzzle task.  Therapist moved objects around for reaching lower and to the right and left, but pt was able to maintain standing balance with unilateral UE use with supervision.  Returned to room at end of session with transfer back to the bed and call button in reach.  Supervision for squat pivot transfer back to the bed.  Call button and phone in reach.    Therapy Documentation Precautions:  Precautions Precautions: Fall Precaution Comments: R BKA Required Braces or Orthoses: Other Brace/Splint Other Brace/Splint: splint for R BK Restrictions Weight Bearing Restrictions: Yes RLE Weight Bearing: Non weight bearing   Pain:  Pt with only report of minimal pain less than 2/10 in the right residual limb Repositioned for comfort, no meds needed ADL: See Function Navigator for Current Functional Status.   Therapy/Group: Individual Therapy  Renaud Celli OTR/L 09/05/2017, 4:18 PM

## 2017-09-05 NOTE — Progress Notes (Signed)
Hypoglycemic Event  CBG: 62  Treatment: snack & glucerna shake  Symptoms: light headed  Follow-up CBG: Time:2200 CBG Result:74  Possible Reasons for Event: unknown  Comments/MD notified:[name]    Azalee CourseKeesha L Schneur Crowson

## 2017-09-05 NOTE — Progress Notes (Signed)
Christopher Callahan PHYSICAL MEDICINE & REHABILITATION     PROGRESS NOTE  Subjective/Complaints:   No issues overnite.  Pain fairly well controlled  ROS: denies CP, SOB, nausea, vomiting, diarrhea.  Objective: Vital Signs: Blood pressure 135/81, pulse 100, temperature 98.4 F (36.9 C), resp. rate 16, height 6\' 1"  (1.854 m), weight 102 kg (224 lb 13.9 oz), SpO2 98 %. No results found. Recent Labs    09/03/17 1909 09/04/17 0640  WBC 10.3 7.1  HGB 11.2* 10.6*  HCT 34.0* 32.0*  PLT 507* 464*   Recent Labs    09/03/17 1909 09/04/17 0640  NA  --  135  K  --  5.4*  CL  --  101  GLUCOSE  --  166*  BUN  --  24*  CREATININE 1.40* 1.32*  CALCIUM  --  8.7*   CBG (last 3)  Recent Labs    09/04/17 2133 09/04/17 2244 09/05/17 0701  GLUCAP 74 216* 160*    Wt Readings from Last 3 Encounters:  09/03/17 102 kg (224 lb 13.9 oz)  08/27/17 106.6 kg (235 lb)  09/02/16 100.2 kg (221 lb)    Physical Exam:  BP 135/81 (BP Location: Right Arm)   Pulse 100   Temp 98.4 F (36.9 C)   Resp 16   Ht 6\' 1"  (1.854 m)   Wt 102 kg (224 lb 13.9 oz)   SpO2 98%   BMI 29.67 kg/m  Constitutional: He appears well-developed. Obese  HENT: Normocephalic and atraumatic.  Eyes: EOM are normal. No discharge.  Cardiovascular: Normal rate, regular rhythm and no JVD. Respiratory: Effort normal and breath sounds normal.  GI: Bowel sounds are normal. He exhibits no distension.  Musculoskeletal: RLE stump with tenderness  Neurological: He is alert and oriented.  Speech clear.  Able to follow basic commands without difficulty.  Motor: B/l UE 5/5.  LLE 4-/5 HF, 4/5 KE, ADF.  RLE: HF 4/5  Skin: Right BKA site is dressed with wound VAC in place.  Patient with well-healed scar on dorsal surface of left foot and callus ulcer on plantar surface under great toe.   Assessment/Plan: 1. Functional deficits secondary to right BKA which require 3+ hours per day of interdisciplinary therapy in a comprehensive  inpatient rehab setting. Physiatrist is providing close team supervision and 24 hour management of active medical problems listed below. Physiatrist and rehab team continue to assess barriers to discharge/monitor patient progress toward functional and medical goals.  Function:  Bathing Bathing position   Position: Sitting EOB  Bathing parts Body parts bathed by patient: Right arm, Left arm, Chest, Abdomen, Buttocks, Front perineal area, Right upper leg, Left upper leg, Left lower leg, Back    Bathing assist Assist Level: Touching or steadying assistance(Pt > 75%)      Upper Body Dressing/Undressing Upper body dressing   What is the patient wearing?: Pull over shirt/dress     Pull over shirt/dress - Perfomed by patient: Thread/unthread right sleeve, Thread/unthread left sleeve, Put head through opening, Pull shirt over trunk          Upper body assist Assist Level: Set up      Lower Body Dressing/Undressing Lower body dressing   What is the patient wearing?: Underwear, Pants, Non-skid slipper socks Underwear - Performed by patient: Thread/unthread right underwear leg, Thread/unthread left underwear leg, Pull underwear up/down   Pants- Performed by patient: Thread/unthread right pants leg, Thread/unthread left pants leg, Pull pants up/down   Non-skid slipper socks- Performed by patient: Don/doff  left sock(right sock NA secondary to amputation)                    Lower body assist Assist for lower body dressing: Touching or steadying assistance (Pt > 75%)      Toileting Toileting   Toileting steps completed by patient: Adjust clothing prior to toileting, Performs perineal hygiene, Adjust clothing after toileting      Toileting assist Assist level: Touching or steadying assistance (Pt.75%)   Transfers Chair/bed transfer   Chair/bed transfer method: Stand pivot Chair/bed transfer assist level: Touching or steadying assistance (Pt > 75%) Chair/bed transfer  assistive device: Armrests     Locomotion Ambulation     Max distance: 69' Assist level: Touching or steadying assistance (Pt > 75%)   Wheelchair   Type: Manual Max wheelchair distance: 150' Assist Level: Supervision or verbal cues  Cognition Comprehension Comprehension assist level: Follows complex conversation/direction with no assist  Expression Expression assist level: Expresses complex ideas: With no assist  Social Interaction Social Interaction assist level: Interacts appropriately with others - No medications needed.  Problem Solving Problem solving assist level: Solves complex problems: Recognizes & self-corrects  Memory Memory assist level: Complete Independence: No helper    Medical Problem List and Plan: 1.  Decreased functional mobility secondary to right BKA 08/27/2017   CIR PT, OT, SLP 2.  DVT Prophylaxis/Anticoagulation: Subcutaneous Lovenox.  Monitor for any bleeding episodes 3. Pain Management: Robaxin and oxycodone as needed 4. Mood: Provide emotional support 5. Neuropsych: This patient is capable of making decisions on his own behalf. 6. Skin/Wound Care: Routine skin checks   Stump shrinker 7. Fluids/Electrolytes/Nutrition: Routine I/O's  8.  Acute blood loss anemia.     Hemoglobin 10.6 on 4/4   Cont to monitor 9.  Diabetes mellitus with peripheral neuropathy.  Hemoglobin A1c 11.1.  NovoLog 10 units 3 times daily, Lantus insulin 25 units twice daily.  Check blood sugars before meals and at bedtime.  Diabetic teaching   CBG (last 3)  Recent Labs    09/04/17 2133 09/04/17 2244 09/05/17 0701  GLUCAP 74 216* 160*  Fluctuating, am CBG in range no change in lantus 10.  Hypertension.  Norvasc 5 mg daily.  Monitor with increased mobility 11.  MRSA PCR screen positive.  Contact precautions 12.  Tobacco abuse.  Counseling 13.  Constipation.  Laxative assistance 14. Hyperkalemia   Borderline 5.4 on 4/4 recheck today 15. AKI   Cr 1.32 on 4/4   Encourage  fluids, mild no need for IVF at this point  LOS (Days) 2 A FACE TO FACE EVALUATION WAS PERFORMED  Erick Colace 09/05/2017 8:15 AM

## 2017-09-05 NOTE — Progress Notes (Addendum)
Occupational Therapy Session Note  Patient Details  Name: Christopher Callahan MRN: [id number] Date of Birth: [birth year]  Today's Date: 09/05/2017 OT Individual Time: 1001-1103 OT Individual Time Calculation (min): 62 min    Short Term Goals: Week 1:  OT Short Term Goal 1 (Week 1): STGs equal to LTGs based on ELOS  Skilled Therapeutic Interventions/Progress Updates:    Pt completed shower and dressing during session.  Min guard assist for functional mobility to the shower from the EOB using the RW.  Once in the shower, he was able to complete all bathing with supervision.  He performed lateral leans for washing peri area.  He ambulated back out to the bed for dressing sit to stand from the EOB.  Grooming tasks completed at the sink from the wheelchair.  Finished session with squat pivot transfer to the bed from the wheelchair with supervision. Call button in reach.    Therapy Documentation Precautions:  Precautions Precautions: Fall Precaution Comments: R BKA Required Braces or Orthoses: Other Brace/Splint Other Brace/Splint: splint for R BK Restrictions Weight Bearing Restrictions: Yes RLE Weight Bearing: Non weight bearing   Pain: Pain Assessment Pain Scale: 0-10 Pain Score: 2  Pain Type: Surgical pain Pain Location: Leg Pain Descriptors / Indicators: Aching Pain Onset: On-going Pain Intervention(s): Repositioned;Emotional support ADL: See Function Navigator for Current Functional Status.   Therapy/Group: Individual Therapy  [name] OTR/L 09/05/2017, 11:23 AM

## 2017-09-05 NOTE — Progress Notes (Signed)
Patient had a hypoglycemic event last night & was given a snack. His blood sugar went back up to 74. Delle Reiningamela Love was called & was given orders to recheck his blood sugars before giving lantus, it was 216. Lantus was given, ordered also to change lantus orders to decrease morning dose to 20 units. Order was transcribed. No acute distress noted.

## 2017-09-06 ENCOUNTER — Inpatient Hospital Stay (HOSPITAL_COMMUNITY): Payer: Federal, State, Local not specified - PPO | Admitting: Physical Therapy

## 2017-09-06 ENCOUNTER — Inpatient Hospital Stay (HOSPITAL_COMMUNITY): Payer: Federal, State, Local not specified - PPO

## 2017-09-06 LAB — GLUCOSE, CAPILLARY
Glucose-Capillary: 90 mg/dL (ref 65–99)
Glucose-Capillary: 124 mg/dL — ABNORMAL HIGH (ref 65–99)
Glucose-Capillary: 117 mg/dL — ABNORMAL HIGH (ref 65–99)
Glucose-Capillary: 128 mg/dL — ABNORMAL HIGH (ref 65–99)
Glucose-Capillary: 166 mg/dL — ABNORMAL HIGH (ref 65–99)

## 2017-09-06 NOTE — Progress Notes (Signed)
Physical Therapy Note  Patient Details  Name: Christopher Callahan MRN: [id number] Date of Birth: Mar 12, 1991 Today's Date: 09/06/2017  1345-1445, 60 min individual tx Pain: 3/10 residual limb' premedicated  Sit> stand pivot without RW bed> w/c to R with supervsion.  Pt stated he needed to use toilet for BM.  Toilet transfer for continent B and B.  W/c propulsion using bil UEs on level tile x 250' until fatigued, with supervison.  PT cued pt for increased efficiency of propulsion and techniques for turns. Outdoors, w/c propulsion/gait planned on slightly uneven terrain.  Pt rested 1-2 minutes afer w/c propulsion.   Seated L heel raises/toe raises x 15 each. Pt stated that he felt "bad, dizzy", so PT returned pt to unit quickly.  See vitals.  Clydie BraunKaren, RN informed. Dizziness deceased but still present in bed. Pt willing to proceed with bed level strengthening.   Therapeutic exercise performed with LEs to increase strength for functional mobility, with rest breaks after each set. In L side lying- 15  X 1 each R hip abduction, R hip extension; in supine: 10 x 1 each L unilateral bridging, L straight leg raises, cervical flexion, 2 x 10  bil shoulder protraction .  BP slightly increased in supine; see vitals in supine.  PT educated pt on desensitizing residual limb/phantom sensation.   Pt left resting in bed with alarm set, and needs at hand.  See function navigator for current status.   Jolena Kittle 09/06/2017, 12:39 PM

## 2017-09-06 NOTE — Progress Notes (Signed)
Christopher Callahan is a 27 y.o. male 1991-01-09 [id number]  Subjective: Patient napping at time of my visit.  Easily awoken and very pleasant.  Reports continued sensation in brain of presence of leg despite BKA.  Denies pain or other problem  Objective: Vital signs in last 24 hours: Temp:  [97.7 F (36.5 C)-98.4 F (36.9 C)] 98.4 F (36.9 C) (04/06 0606) Pulse Rate:  [96-103] 96 (04/06 0606) Resp:  [16-18] 18 (04/06 0606) BP: (125-135)/(80-83) 125/83 (04/06 0606) SpO2:  [99 %-100 %] 100 % (04/06 0606) Weight change:  Last BM Date: 09/03/17  Intake/Output from previous day: 04/05 0701 - 04/06 0700 In: 720 [P.O.:720] Out: 1400 [Urine:1400]  Physical Exam General: No apparent distress   lying in lateral decubitus in bed Lungs: Normal effort. Lungs clear to auscultation, no crackles or wheezes. Cardiovascular: Regular rate and rhythm, no edema Wounds: R BKA with dressing clean dry and intact  Lab Results: BMET    Component Value Date/Time   NA 134 (L) 09/05/2017 0716   K 4.8 09/05/2017 0716   CL 98 (L) 09/05/2017 0716   CO2 24 09/05/2017 0716   GLUCOSE 163 (H) 09/05/2017 0716   BUN 21 (H) 09/05/2017 0716   CREATININE 1.15 09/05/2017 0716   CALCIUM 9.1 09/05/2017 0716   GFRNONAA >60 09/05/2017 0716   GFRAA >60 09/05/2017 0716   CBC    Component Value Date/Time   WBC 7.1 09/04/2017 0640   RBC 3.82 (L) 09/04/2017 0640   HGB 10.6 (L) 09/04/2017 0640   HCT 32.0 (L) 09/04/2017 0640   PLT 464 (H) 09/04/2017 0640   MCV 83.8 09/04/2017 0640   MCH 27.7 09/04/2017 0640   MCHC 33.1 09/04/2017 0640   RDW 13.0 09/04/2017 0640   LYMPHSABS 2.5 09/04/2017 0640   MONOABS 0.5 09/04/2017 0640   EOSABS 0.1 09/04/2017 0640   BASOSABS 0.0 09/04/2017 0640   CBG's (last 3):   Recent Labs    09/05/17 1124 09/05/17 1714 09/05/17 2206  GLUCAP 66 120* 133*   LFT's Lab Results  Component Value Date   ALT 14 (L) 09/04/2017   AST 15 09/04/2017   ALKPHOS 92 09/04/2017   BILITOT  0.5 09/04/2017    Studies/Results: No results found.  Medications:  I have reviewed the patient's current medications. Scheduled Medications: . amLODipine  5 mg Oral Daily  . docusate sodium  100 mg Oral BID  . enoxaparin (LOVENOX) injection  40 mg Subcutaneous Q24H  . feeding supplement (GLUCERNA SHAKE)  237 mL Oral TID BM  . insulin aspart  0-15 Units Subcutaneous TID WC  . insulin aspart  10 Units Subcutaneous TID WC  . insulin glargine  20 Units Subcutaneous Daily  . insulin glargine  25 Units Subcutaneous QHS   PRN Medications: acetaminophen, bisacodyl, methocarbamol **OR** methocarbamol (ROBAXIN)  IV, ondansetron **OR** ondansetron (ZOFRAN) IV, oxyCODONE, polyethylene glycol, sorbitol  Assessment/Plan: Active Problems:   S/P below knee amputation, right (HCC)   Unilateral complete BKA, right, sequela (HCC)   Poorly controlled type 2 diabetes mellitus with peripheral neuropathy (HCC)   Hyperkalemia   AKI (acute kidney injury) (HCC)   Length of stay, days: 3  1.  Right BKA March 27.  Continue ongoing wound/dressing care as ongoing and CIR therapies for PT and OT as ordered.  No changes in management recommended at this time. 2.  Uncontrolled type 2 diabetes prior to admission with peripheral neuropathy.  Currently with mild but symptomatic hypoglycemia prompting a reduction in basal insulin dosing.  Continue titration as needed with sliding scale coverage as ordered. 3.  Hypertension.  BP is well controlled on current therapy.  Continue same Acute blood loss anemia related to surgery.  Hemodynamically stable and no signs of ongoing loss.  Follow CBC for hemoglobin trend as needed  Christopher Callahan A. Felicity CoyerLeschber, MD 09/06/2017, 9:40 AM

## 2017-09-06 NOTE — Progress Notes (Signed)
Physical Therapy Session Note  Patient Details  Name: Christopher Callahan MRN: [id number] Date of Birth: [birth year]  Today's Date: 09/06/2017 PT Individual Time: 0900-1000 PT Individual Time Calculation (min): 60 min   Short Term Goals: Week 1:  PT Short Term Goal 1 (Week 1): = LTGs  Skilled Therapeutic Interventions/Progress Updates:    Pt seated in w/c in room completing ADLs at sink, agreeable to participate in therapy session. Pt reports no pain this AM. Pt is able to dress himself independently from w/c level. Manual w/c propulsio x 150 ft with Supervision. Transfer w/c to floor with min assist. Pt is able to bump himself up 10 stairs on his bottom with SBA for safety. Pt is able to stand once near the top of the stairs with use of R handrail and min assist, but is unable to hop up remaining two stairs and unable to stand from top landing with use of handrails. Pt is able to bump himself back down 10 stairs with SBA for safety. Pt is able to stand once he reaches bottom of stairs with use of R handrail and RW and mod assist for balance. Will continue to practice stairs and assess safest method for pt to perform stairs at home. Transfer w/c to floor mat with CGA. Pt is able to transfer floor to w/c from 6" step with min assist and from 4" step with mod assist. Discussed pt having a step-stool at home to use to transfer to w/c once he reaches top of stairs at home. Ambulation x 40 ft with RW and SBA. Supine BLE therex x 15 reps: SLR, hip abd, quad sets. Prone hip flexor stretch x 10 min. Pt left seated in bed at end of therapy session with needs in reach.  Therapy Documentation Precautions:  Precautions Precautions: Fall Precaution Comments: R BKA Required Braces or Orthoses: Other Brace/Splint Other Brace/Splint: splint for R BK Restrictions Weight Bearing Restrictions: Yes RLE Weight Bearing: Non weight bearing  See Function Navigator for Current Functional Status.   Therapy/Group:  Individual Therapy  Peter Congoaylor Ryder Man, PT, DPT  09/06/2017, 10:51 AM

## 2017-09-06 NOTE — Progress Notes (Signed)
Occupational Therapy Session Note  Patient Details  Name: Trevor Macentonio Tinoco MRN: [id number] Date of Birth: 16-Jan-1991  Today's Date: 09/06/2017 OT Individual Time: 1530-1615 OT Individual Time Calculation (min): 45 min    Short Term Goals: Week 1:  OT Short Term Goal 1 (Week 1): STGs equal to LTGs based on ELOS  Skilled Therapeutic Interventions/Progress Updates:    1;1. Pt missed 30 min skilled OT at beginning d/t dizziness and not feeling well. RN alerted. Vitals assessed and WFL. Blood sugar normal. Checked back in and pt willing to participate however stil dizzy. Vitals reassessed and WFL again. Pt propels w/c to tx gym with supervision. Pt completes UB and core exercises 1x15 as follows:  Shoulder flexion/extension, horizontal ab/aduction, internal external rotation, shoulder press,  Chest press 6# medicine ball W/c push  Up 3x5 total Trunk rotation, flexion/extension 6# med ball  PT ambulates to bathroom with CGA with RW and transfers onto toilet for BM. Pt left seated on toilet with cord in hand. RN aware of positioning.  Therapy Documentation Precautions:  Precautions Precautions: Fall Precaution Comments: R BKA Required Braces or Orthoses: Other Brace/Splint Other Brace/Splint: splint for R BK Restrictions Weight Bearing Restrictions: Yes RLE Weight Bearing: Non weight bearing  See Function Navigator for Current Functional Status.   Therapy/Group: Individual Therapy  Shon HaleStephanie M Jovoni Borkenhagen 09/06/2017, 3:46 PM

## 2017-09-07 ENCOUNTER — Inpatient Hospital Stay (HOSPITAL_COMMUNITY): Payer: Federal, State, Local not specified - PPO

## 2017-09-07 LAB — GLUCOSE, CAPILLARY
Glucose-Capillary: 159 mg/dL — ABNORMAL HIGH (ref 65–99)
Glucose-Capillary: 122 mg/dL — ABNORMAL HIGH (ref 65–99)
Glucose-Capillary: 117 mg/dL — ABNORMAL HIGH (ref 65–99)
Glucose-Capillary: 102 mg/dL — ABNORMAL HIGH (ref 65–99)

## 2017-09-07 NOTE — Progress Notes (Signed)
Occupational Therapy Session Note  Patient Details  Name: Christopher Callahan MRN: [id number] Date of Birth: 1990-10-07  Today's Date: 09/07/2017 OT Individual Time: 6924-9324 OT Individual Time Calculation (min): 30 min    Short Term Goals: Week 1:  OT Short Term Goal 1 (Week 1): STGs equal to LTGs based on ELOS  Skilled Therapeutic Interventions/Progress Updates:    1:1. Pt completes w/c propulsion to outside courtyard with supervision and 3 rest breaks d/t decreased endurance. Pt ambulates 30 feet with RW hopping on uneven surfaces in prep for community mobility. Pt requires min A during ambualtion and toe nail so long on great toe that it cuts through sock. Recommend pt have family or podiatrist come trim toe nails. Pt propels w/c up ramp with cues to lean forward for better propulsion. Exited session with pt seated in w/c in room call light in reach and all needs met.  Therapy Documentation Precautions:  Precautions Precautions: Fall Precaution Comments: R BKA Required Braces or Orthoses: Other Brace/Splint Other Brace/Splint: splint for R BK Restrictions Weight Bearing Restrictions: Yes RLE Weight Bearing: Non weight bearing  See Function Navigator for Current Functional Status.   Therapy/Group: Individual Therapy  [name] 09/07/2017, 4:41 PM

## 2017-09-07 NOTE — Progress Notes (Signed)
Christopher Callahan is a 27 y.o. male May 27, 1991 [id number]  Subjective: No new complaints. No new problems. Feeling OK.  Objective: Vital signs in last 24 hours: Temp:  [98.2 F (36.8 C)-98.7 F (37.1 C)] 98.2 F (36.8 C) (04/07 0421) Pulse Rate:  [101-107] 101 (04/07 0421) Resp:  [18] 18 (04/07 0421) BP: (117-136)/(79-89) 132/79 (04/07 0421) SpO2:  [97 %-100 %] 100 % (04/07 0421) Weight change:  Last BM Date: 09/05/17  Intake/Output from previous day: 04/06 0701 - 04/07 0700 In: 684 [P.O.:684] Out: 1200 [Urine:1200]  Physical Exam General: No apparent distress    Lungs: Normal effort. Lungs clear to auscultation, no crackles or wheezes. Cardiovascular: Regular rate and rhythm, no edema Musculoskeletal:  R BKA -  Wounds: dressing clean, dry, intact. No signs of infection.  Lab Results: BMET    Component Value Date/Time   NA 134 (L) 09/05/2017 0716   K 4.8 09/05/2017 0716   CL 98 (L) 09/05/2017 0716   CO2 24 09/05/2017 0716   GLUCOSE 163 (H) 09/05/2017 0716   BUN 21 (H) 09/05/2017 0716   CREATININE 1.15 09/05/2017 0716   CALCIUM 9.1 09/05/2017 0716   GFRNONAA >60 09/05/2017 0716   GFRAA >60 09/05/2017 0716   CBC    Component Value Date/Time   WBC 7.1 09/04/2017 0640   RBC 3.82 (L) 09/04/2017 0640   HGB 10.6 (L) 09/04/2017 0640   HCT 32.0 (L) 09/04/2017 0640   PLT 464 (H) 09/04/2017 0640   MCV 83.8 09/04/2017 0640   MCH 27.7 09/04/2017 0640   MCHC 33.1 09/04/2017 0640   RDW 13.0 09/04/2017 0640   LYMPHSABS 2.5 09/04/2017 0640   MONOABS 0.5 09/04/2017 0640   EOSABS 0.1 09/04/2017 0640   BASOSABS 0.0 09/04/2017 0640   CBG's (last 3):   Recent Labs    09/06/17 2121 09/07/17 0642 09/07/17 1115  GLUCAP 166* 159* 122*   LFT's Lab Results  Component Value Date   ALT 14 (L) 09/04/2017   AST 15 09/04/2017   ALKPHOS 92 09/04/2017   BILITOT 0.5 09/04/2017    Studies/Results: No results found.  Medications:  I have reviewed the patient's current  medications. Scheduled Medications: . amLODipine  5 mg Oral Daily  . docusate sodium  100 mg Oral BID  . enoxaparin (LOVENOX) injection  40 mg Subcutaneous Q24H  . feeding supplement (GLUCERNA SHAKE)  237 mL Oral TID BM  . insulin aspart  0-15 Units Subcutaneous TID WC  . insulin aspart  10 Units Subcutaneous TID WC  . insulin glargine  20 Units Subcutaneous Daily  . insulin glargine  25 Units Subcutaneous QHS   PRN Medications: acetaminophen, bisacodyl, methocarbamol **OR** methocarbamol (ROBAXIN)  IV, ondansetron **OR** ondansetron (ZOFRAN) IV, oxyCODONE, polyethylene glycol, sorbitol  Assessment/Plan: Active Problems:   S/P below knee amputation, right (HCC)   Unilateral complete BKA, right, sequela (HCC)   Poorly controlled type 2 diabetes mellitus with peripheral neuropathy (HCC)   Hyperkalemia   AKI (acute kidney injury) (HCC)   Length of stay, days: 4  See problems as listed 4/6 - no changes Continue ongoing CIR with mgmt of chronic medical co morbidity as ongoing  Chlora Mcbain A. Felicity CoyerLeschber, MD 09/07/2017, 11:54 AM

## 2017-09-08 ENCOUNTER — Inpatient Hospital Stay (HOSPITAL_COMMUNITY): Payer: Federal, State, Local not specified - PPO | Admitting: Occupational Therapy

## 2017-09-08 ENCOUNTER — Encounter (HOSPITAL_COMMUNITY): Payer: Federal, State, Local not specified - PPO | Admitting: Psychology

## 2017-09-08 ENCOUNTER — Inpatient Hospital Stay (HOSPITAL_COMMUNITY): Payer: Federal, State, Local not specified - PPO | Admitting: Physical Therapy

## 2017-09-08 ENCOUNTER — Inpatient Hospital Stay (HOSPITAL_COMMUNITY): Payer: Federal, State, Local not specified - PPO

## 2017-09-08 LAB — GLUCOSE, CAPILLARY
GLUCOSE-CAPILLARY: 124 mg/dL — AB (ref 65–99)
GLUCOSE-CAPILLARY: 129 mg/dL — AB (ref 65–99)
Glucose-Capillary: 103 mg/dL — ABNORMAL HIGH (ref 65–99)
Glucose-Capillary: 154 mg/dL — ABNORMAL HIGH (ref 65–99)

## 2017-09-08 MED ORDER — BISACODYL 5 MG PO TBEC
5.0000 mg | DELAYED_RELEASE_TABLET | Freq: Every day | ORAL | Status: DC
  Administered 2017-09-08 – 2017-09-11 (×4): 5 mg via ORAL
  Filled 2017-09-08 (×4): qty 1

## 2017-09-08 NOTE — Progress Notes (Signed)
Physical Therapy Session Note  Patient Details  Name: Christopher Callahan MRN: [id number] Date of Birth: [birth year]  Today's Date: 09/08/2017 PT Individual Time: 0900-1000 PT Individual Time Calculation (min): 60 min   Short Term Goals: Week 1:  PT Short Term Goal 1 (Week 1): = LTGs  Skilled Therapeutic Interventions/Progress Updates: Pt received seated in w/c, denies pain and agreeable to treatment. Dons RLE limbguard with setupA. W/c propulsion modI throughout session. Performed ascent/descent 12 steps 6" height in stairwell with use of railing for stand pivot to sit on 2nd step from bottom. Discussed options for home entry as pt has difficulty getting up from floor and has trialed several different methods. Pt plans to bump into doorway and over to couch, which is very close to top of steps. Gait with RW x50' with S; min cues for technique to slow lower to floor to protect L foot. Gait second trial x10 with RW lowered one notch; improved control of LLE lowering and decreased energy expenditure per pt report. Pt reports checking skin on L foot daily and with nursing assistance. Mat core exercises including semi-reclined situps, russian twist, crunches, bicycle crunches, heel touches. Prone lying x3 min for hip flexor stretch. Prone "superman" hold 2x1 min, swimmers 1x15 each side. Seated RLE hamstring stretch x2 min. Returned to w/c S stand pivot, w/c propulsion to room modI. Remained in w/c, all needs in reach.      Therapy Documentation Precautions:  Precautions Precautions: Fall Precaution Comments: R BKA Required Braces or Orthoses: Other Brace/Splint Other Brace/Splint: splint for R BK Restrictions Weight Bearing Restrictions: Yes RLE Weight Bearing: Non weight bearing Pain: Pain Assessment Pain Score: Asleep  See Function Navigator for Current Functional Status.   Therapy/Group: Individual Therapy  Vista Lawmanlizabeth J Tygielski 09/08/2017, 9:53 AM

## 2017-09-08 NOTE — Consult Note (Signed)
Neuropsychological Consultation   Patient:   Christopher Callahan   DOB:   12/12/1990  MR Number:  [id number]  [facility]  [facility] [address] [facility] [address] [id number] [place] [id number] Dept: [phone] Loc: [phone]           Date of Service:   09/08/2017  Start Time:   8 AM End Time:   9 AM  Provider/Observer:  Arley PhenixJohn Rodenbough, Psy.D.       Clinical Neuropsychologist       Billing Code/Service: [id number] 4 Units  Chief Complaint:    Christopher Macentonio Mcgath is a 27 year old male with history of T1DM with neuropathy.  Tobacco use, chronic diabetic foot ulcers with worsening despite antibiotics.  Admitted 08/24/2017 with ulceration of right foot, malaise with aches and pains.  Patient reported (including from self) that he was non-compliant with insulin and monitoring blood glucose.  Patient was reported to be encephalopathic prior prompting his [place] get [place] [facility]  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   Patient admitted for comprehensive rehab program.    Reason for Service:  Christopher Macentonio Janney was referred for neuropsychological consultation due to coping and adjustment issues.  Below is the HPI for the current admission.    HPI: Christopher Keelingis a 27 y.o. right-handedmalewith history of T1DM with neuropathy, tobacco use, chronic diabetic foot ulcers with worsening despite antibiotics.  Per chart review and patient, patient lives with his mother.  One level home with multiple steps.  Independent prior to admission.  He was admitted on 08/24/17 with ulceration of the right foot and increased drainage, malaise with aches and pain. Patient reports being maintained on oral antibiotics for wound care and inability to manage (not acting right) at home [place] [facility] [place] [facility]He was started on broad spectrumantibiotics for sepsis due to right foot osteomyelitis with abscess and IV  insulin due to BS 856 as well as fluids for AKI. Dr. 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 Post op continues to have poorly controlled BS.  Pain management as directed.  Hyponatremia nad leucocytosis have resolved.  Acute blood loss anemia 10.5 and monitored.  Subcutaneous Lovenox for DVT prophylaxis.  MRSA PCR screening positive with contact precautions.Therapy evaluation done and CIR recommended due to functional deficits .  Patient was admitted for a comprehensive rehab program  Current Status:  Patient reports that he is lonely being in hospital.  Admits that he was not compliant with meds and felt that "they were not helpful."  He was not able to explain why he was needing insulin.  Patient did have good mental status.  Worked on coping and understanding need for compliance with care due to T1DM.  Behavioral Observation: Christopher Macentonio Sak  presents as a 27 y.o.-year-old Right African American Male who appeared his stated age. his dress was Appropriate and he was Well Groomed and his manners were Appropriate to the situation.  his participation was indicative of Appropriate and Attentive behaviors.  There were any physical disabilities noted.  he displayed an appropriate level of cooperation and motivation.     Interactions:    Active Appropriate and Attentive  Attention:   within normal limits and attention span and concentration were age appropriate  Memory:   within normal limits; recent and remote memory intact  Visuo-spatial:  not examined  Speech (Volume):  low  Speech:   normal;  normal  Thought Process:  Coherent and Relevant  Though Content:  WNL; not suicidal and not homicidal  Orientation:   person, place and time/date  Judgment:   Poor  Planning:   Fair  Affect:    Depressed and Flat  Mood:    Dysphoric  Insight:   Fair  Intelligence:   normal  Medical History:   Past Medical History:  Diagnosis Date  . Diabetes mellitus without  complication (HCC)    Family Med/Psych History:  Family History  Problem Relation Age of Onset  . COPD Father     Risk of Suicide/Violence: low Patient denies SI or HI although there was life threating lack of compliance with insulin and monitoring of BG.    Impression/DX:  Christopher Callahan is a 27 year old male with history of T1DM with neuropathy.  Tobacco use, chronic diabetic foot ulcers with worsening despite antibiotics.  Admitted 08/24/2017 with ulceration of right foot, malaise with aches and pains.  Patient reported (including from self) that he was non-compliant with insulin and monitoring blood glucose.  Patient was reported to be encephalopathic prior prompting his mother to get him from his home in [place] and bring him to [facility].  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   Patient admitted for comprehensive rehab program.    Patient reports that he is lonely being in hospital.  Admits that he was not compliant with meds and felt that "they were not helpful."  He was not able to explain why he was needing insulin.  Patient did have good mental status.  Worked on coping and understanding need for compliance with care due to T1DM.  Diagnosis:    R-BKA        Electronically Signed   _______________________ Arley Phenix, Psy.D.

## 2017-09-08 NOTE — Progress Notes (Signed)
Physical Therapy Session Note  Patient Details  Name: Christopher Callahan MRN: [id number] Date of Birth: Aug 07, 1990  Today's Date: 09/08/2017 PT Individual Time: [id number] PT Individual Time Calculation (min): 42 min   Short Term Goals: Week 1:  PT Short Term Goal 1 (Week 1): = LTGs  Skilled Therapeutic Interventions/Progress Updates:    Session focused on functional transfer training including bed <> w/c, w/c <> floor, and floor <> couch transfer, w/c mobility and parts management, and short distance gait training with RW for functional mobility and general strengthening. Pt able to complete transfers at overall supervision level including floor transfers (practiced to address need to get from floor once at top of stairs onto couch or w/c). Discussed options for his and agreed that pt would bump up on bottom, transition on floor into apartment and then transfer to couch (pt reports right inside of doorway which is right at top of stairs) and then could use RW in the home. During gait training, provided verbal cues for increased efficiency using UE's vs hopping with LLE - x 50' with supervision.   Therapy Documentation Precautions:  Precautions Precautions: Fall Precaution Comments: R BKA Required Braces or Orthoses: Other Brace/Splint Other Brace/Splint: splint for R BK Restrictions Weight Bearing Restrictions: Yes RLE Weight Bearing: Non weight bearing  Pain: Denies pain.    See Function Navigator for Current Functional Status.   Therapy/Group: Individual Therapy  Karolee StampsGray, Rohit Deloria Darrol PokeBrescia  Terryn Redner B. Herchel Hopkin, PT, DPT  09/08/2017, 3:13 PM

## 2017-09-08 NOTE — Progress Notes (Signed)
Occupational Therapy Session Note  Patient Details  Name: Nester Bachus MRN: [id number] Date of Birth: 1991-03-22  Today's Date: 09/08/2017 OT Individual Time: 1130-1158 and 1300-1359 OT Individual Time Calculation (min): 28 min and 59 min  Short Term Goals: Week 1:  OT Short Term Goal 1 (Week 1): STGs equal to LTGs based on ELOS  Skilled Therapeutic Interventions/Progress Updates:    Pt greeted supine in bed. ADL needs met. Supervision stand pivot<w/c without device. He self propelled to therapy apartment, and we collaborated regarding w/c level meal prep for home. Pt participating in simulated cooking tasks, including retrieving plates, items from fridge, and pans from oven. He was mindful of locking w/c brakes at appropriate times. Had him practice sit<stands at countertop for retrieving items in higher shelves and for managing stovetop controls. He completed w/c parts mgt throughout, including retrieving parts from floor. Discussed kitchen modifications to implement in home to increase ease of w/c access. Pt then self propelled back to room and transferred to EOB to eat lunch. Left him with all needs within reach.    2nd Session 1:1 tx (59 min) Pt greeted EOB after lunch, requesting to take a shower. Tx focus on balance, functional transfers, pt education, and endurance during bathing and dressing tasks. Taught pt how to wrap his residual limb for showering at home and he assisted therapist with this at start of session. He bathed and dressed at supervision level using RW as needed. Functional transfers at ambulatory level completed with RW and supervision as well. At end of tx pt was left EOB with all needs within reach.    Therapy Documentation Precautions:  Precautions Precautions: Fall Precaution Comments: R BKA Required Braces or Orthoses: Other Brace/Splint Other Brace/Splint: splint for R BK Restrictions Weight Bearing Restrictions: Yes RLE Weight Bearing: Non weight  bearing Pain: No c/o pain during session    ADL:     See Function Navigator for Current Functional Status.  Therapy/Group: Individual Therapy  Kyri Dai A Umeka Wrench 09/08/2017, 12:35 PM

## 2017-09-08 NOTE — Progress Notes (Signed)
[place] PHYSICAL MEDICINE & REHABILITATION     PROGRESS NOTE  Subjective/Complaints:   No issues overnite except poor sleep, no pain.  No bowel or bladder issues at noc  ROS: denies CP, SOB, nausea, vomiting, diarrhea.  Objective: Vital Signs: Blood pressure (!) 142/95, pulse 97, temperature 98.5 F (36.9 C), temperature source Oral, resp. rate 20, height 6\' 1"  (1.854 m), weight 102 kg (224 lb 13.9 oz), SpO2 100 %. No results found. No results for input(s): WBC, HGB, HCT, PLT in the last 72 hours. No results for input(s): NA, K, CL, GLUCOSE, BUN, CREATININE, CALCIUM in the last 72 hours.  Invalid input(s): CO CBG (last 3)  Recent Labs    09/07/17 1659 09/07/17 2101 09/08/17 0641  GLUCAP 117* 102* 124*    Wt Readings from Last 3 Encounters:  09/03/17 102 kg (224 lb 13.9 oz)  08/27/17 106.6 kg (235 lb)  09/02/16 100.2 kg (221 lb)    Physical Exam:  BP (!) 142/95 (BP Location: Left Arm)   Pulse 97   Temp 98.5 F (36.9 C) (Oral)   Resp 20   Ht 6\' 1"  (1.854 m)   Wt 102 kg (224 lb 13.9 oz)   SpO2 100%   BMI 29.67 kg/m  Constitutional: He appears well-developed. Obese  HENT: Normocephalic and atraumatic.  Eyes: EOM are normal. No discharge.  Cardiovascular: Normal rate, regular rhythm and no JVD. Respiratory: Effort normal and breath sounds normal.  GI: Bowel sounds are normal. He exhibits no distension.  Musculoskeletal: RLE stump with tenderness  Neurological: He is alert and oriented.  Speech clear.  Able to follow basic commands without difficulty.  Motor: B/l UE 5/5.  LLE 4-/5 HF, 4/5 KE, ADF.  RLE: HF 4/5  Skin: Right BKA site is dressed with wound VAC in place.  Patient with well-healed scar on dorsal surface of left foot and callus ulcer on plantar surface under great toe.   Assessment/Plan: 1. Functional deficits secondary to right BKA which require 3+ hours per day of interdisciplinary therapy in a comprehensive inpatient rehab setting. Physiatrist  is providing close team supervision and 24 hour management of active medical problems listed below. Physiatrist and rehab team continue to assess barriers to discharge/monitor patient progress toward functional and medical goals.  Function:  Bathing Bathing position   Position: Shower  Bathing parts Body parts bathed by patient: Right arm, Left arm, Chest, Abdomen, Buttocks, Front perineal area, Right upper leg, Left upper leg, Left lower leg, Back    Bathing assist Assist Level: Touching or steadying assistance(Pt > 75%)      Upper Body Dressing/Undressing Upper body dressing   What is the patient wearing?: Pull over shirt/dress     Pull over shirt/dress - Perfomed by patient: Thread/unthread right sleeve, Thread/unthread left sleeve, Put head through opening, Pull shirt over trunk          Upper body assist Assist Level: Set up      Lower Body Dressing/Undressing Lower body dressing   What is the patient wearing?: Underwear, Pants, Non-skid slipper socks Underwear - Performed by patient: Thread/unthread right underwear leg, Thread/unthread left underwear leg, Pull underwear up/down   Pants- Performed by patient: Thread/unthread right pants leg, Thread/unthread left pants leg, Pull pants up/down   Non-skid slipper socks- Performed by patient: Don/doff left sock                    Lower body assist Assist for lower body dressing: Supervision or verbal  cues      Toileting Toileting   Toileting steps completed by patient: Adjust clothing prior to toileting, Performs perineal hygiene, Adjust clothing after toileting      Toileting assist Assist level: Touching or steadying assistance (Pt.75%)   Transfers Chair/bed transfer   Chair/bed transfer method: Stand pivot Chair/bed transfer assist level: Supervision or verbal cues Chair/bed transfer assistive device: Armrests     Locomotion Ambulation     Max distance: 20 Assist level: Supervision or verbal cues    Wheelchair   Type: Manual Max wheelchair distance: 250 Assist Level: Supervision or verbal cues  Cognition Comprehension Comprehension assist level: Follows complex conversation/direction with no assist  Expression Expression assist level: Expresses complex ideas: With no assist  Social Interaction Social Interaction assist level: Interacts appropriately with others - No medications needed.  Problem Solving Problem solving assist level: Solves complex problems: Recognizes & self-corrects  Memory Memory assist level: Complete Independence: No helper    Medical Problem List and Plan: 1.  Decreased functional mobility secondary to right BKA 08/27/2017   CIR PT, OT, team conf in am 2.  DVT Prophylaxis/Anticoagulation: Subcutaneous Lovenox.  Monitor for any bleeding episodes 3. Pain Management: Robaxin and oxycodone as needed 4. Mood: Provide emotional support 5. Neuropsych: This patient is capable of making decisions on his own behalf. 6. Skin/Wound Care: Routine skin checks   Stump shrinker 7. Fluids/Electrolytes/Nutrition: Routine I/O's  8.  Acute blood loss anemia.     Hemoglobin 10.6 on 4/4   Cont to monitor 9.  Diabetes mellitus with peripheral neuropathy.  Hemoglobin A1c 11.1.  NovoLog 10 units 3 times daily, Lantus insulin 25 units twice daily.  Check blood sugars before meals and at bedtime.  Diabetic teaching   CBG (last 3)  Recent Labs    09/07/17 1659 09/07/17 2101 09/08/17 0641  GLUCAP 117* 102* 124*  Controlled 4/8 10.  Hypertension.  Norvasc 5 mg daily.  Monitor with increased mobility 11.  MRSA PCR screen positive.  Contact precautions 12.  Tobacco abuse.  Counseling 13.  Constipation.  Laxative assistance 14. Hyperkalemia   Borderline 5.4 on 4/4 recheck 4/5 normal at 4.8 15. AKI- resolved   Cr 1.32 on 4/4, down to 1.15 on 4/5   LOS (Days) 5 A FACE TO FACE EVALUATION WAS PERFORMED  Christopher Callahan 09/08/2017 7:27 AM

## 2017-09-09 ENCOUNTER — Inpatient Hospital Stay (HOSPITAL_COMMUNITY): Payer: Federal, State, Local not specified - PPO | Admitting: Physical Therapy

## 2017-09-09 ENCOUNTER — Inpatient Hospital Stay (HOSPITAL_COMMUNITY): Payer: Federal, State, Local not specified - PPO | Admitting: Occupational Therapy

## 2017-09-09 DIAGNOSIS — E108 Type 1 diabetes mellitus with unspecified complications: Secondary | ICD-10-CM

## 2017-09-09 DIAGNOSIS — E1065 Type 1 diabetes mellitus with hyperglycemia: Secondary | ICD-10-CM

## 2017-09-09 LAB — GLUCOSE, CAPILLARY
Glucose-Capillary: 106 mg/dL — ABNORMAL HIGH (ref 65–99)
Glucose-Capillary: 83 mg/dL (ref 65–99)
Glucose-Capillary: 127 mg/dL — ABNORMAL HIGH (ref 65–99)
Glucose-Capillary: 92 mg/dL (ref 65–99)

## 2017-09-09 NOTE — Progress Notes (Signed)
[place] PHYSICAL MEDICINE & REHABILITATION     PROGRESS NOTE  Subjective/Complaints:  Pt seen lying in bed this AM.  He slept well overnight.  He denies complaints.   ROS: Denies CP, SOB, nausea, vomiting, diarrhea.  Objective: Vital Signs: Blood pressure 124/80, pulse 91, temperature 98.4 F (36.9 C), temperature source Oral, resp. rate 17, height 6\' 1"  (1.854 m), weight 102 kg (224 lb 13.9 oz), SpO2 99 %. No results found. No results for input(s): WBC, HGB, HCT, PLT in the last 72 hours. No results for input(s): NA, K, CL, GLUCOSE, BUN, CREATININE, CALCIUM in the last 72 hours.  Invalid input(s): CO CBG (last 3)  Recent Labs    09/08/17 1652 09/08/17 2124 09/09/17 0703  GLUCAP 154* 129* 106*    Wt Readings from Last 3 Encounters:  09/03/17 102 kg (224 lb 13.9 oz)  08/27/17 106.6 kg (235 lb)  09/02/16 100.2 kg (221 lb)    Physical Exam:  BP 124/80 (BP Location: Left Arm)   Pulse 91   Temp 98.4 F (36.9 C) (Oral)   Resp 17   Ht 6\' 1"  (1.854 m)   Wt 102 kg (224 lb 13.9 oz)   SpO2 99%   BMI 29.67 kg/m  Constitutional: He appears well-developed. Obese  HENT: Normocephalic and atraumatic.  Eyes: EOM are normal. No discharge.  Cardiovascular: RRR and no JVD. Respiratory: Effort normal and breath sounds normal.  GI: Bowel sounds are normal. He exhibits no distension.  Musculoskeletal: RLE stump with tenderness  Neurological: He is alert and oriented.  Speech clear.  Able to follow basic commands without difficulty.  Motor: B/l UE 5/5.  LLE 4--4/5 HF, 4/5 KE, ADF.  RLE: HF 4/5  Skin: Right BKA site with incision with mild drainage along medial aspect  Assessment/Plan: 1. Functional deficits secondary to right BKA which require 3+ hours per day of interdisciplinary therapy in a comprehensive inpatient rehab setting. Physiatrist is providing close team supervision and 24 hour management of active medical problems listed below. Physiatrist and rehab team continue  to assess barriers to discharge/monitor patient progress toward functional and medical goals.  Function:  Bathing Bathing position   Position: Shower  Bathing parts Body parts bathed by patient: Right arm, Left arm, Chest, Abdomen, Buttocks, Front perineal area, Right upper leg, Left upper leg, Left lower leg, Back    Bathing assist Assist Level: Supervision or verbal cues      Upper Body Dressing/Undressing Upper body dressing   What is the patient wearing?: Pull over shirt/dress     Pull over shirt/dress - Perfomed by patient: Thread/unthread right sleeve, Thread/unthread left sleeve, Put head through opening, Pull shirt over trunk          Upper body assist Assist Level: Set up      Lower Body Dressing/Undressing Lower body dressing   What is the patient wearing?: Pants, Non-skid slipper socks Underwear - Performed by patient: Thread/unthread right underwear leg, Thread/unthread left underwear leg, Pull underwear up/down   Pants- Performed by patient: Thread/unthread right pants leg, Thread/unthread left pants leg, Pull pants up/down   Non-skid slipper socks- Performed by patient: Don/doff left sock                    Lower body assist Assist for lower body dressing: Supervision or verbal cues      Toileting Toileting   Toileting steps completed by patient: Adjust clothing prior to toileting, Performs perineal hygiene, Adjust clothing after toileting  Toileting assist Assist level: Touching or steadying assistance (Pt.75%)   Transfers Chair/bed transfer   Chair/bed transfer method: Stand pivot Chair/bed transfer assist level: Supervision or verbal cues Chair/bed transfer assistive device: Armrests     Locomotion Ambulation     Max distance: 50 Assist level: Supervision or verbal cues   Wheelchair   Type: Manual Max wheelchair distance: 150 Assist Level: No help, No cues, assistive device, takes more than reasonable amount of time   Cognition Comprehension Comprehension assist level: Follows complex conversation/direction with no assist  Expression Expression assist level: Expresses complex ideas: With no assist  Social Interaction Social Interaction assist level: Interacts appropriately with others - No medications needed.  Problem Solving Problem solving assist level: Solves complex problems: Recognizes & self-corrects  Memory Memory assist level: Complete Independence: No helper    Medical Problem List and Plan: 1.  Decreased functional mobility secondary to right BKA 08/27/2017     Cont CIR    Notes reviewed 2.  DVT Prophylaxis/Anticoagulation: Subcutaneous Lovenox.  Monitor for any bleeding episodes 3. Pain Management: Robaxin and oxycodone as needed 4. Mood: Provide emotional support 5. Neuropsych: This patient is capable of making decisions on his own behalf. 6. Skin/Wound Care: Routine skin checks   Stump shrinker 7. Fluids/Electrolytes/Nutrition: Routine I/O's  8.  Acute blood loss anemia.     Hemoglobin 10.6 on 4/4   Cont to monitor 9.  Diabetes mellitus with peripheral neuropathy.  Hemoglobin A1c 11.1.  NovoLog 10 units 3 times daily, Lantus insulin 25 units twice daily.  Check blood sugars before meals and at bedtime.  Diabetic teaching   CBG (last 3)  Recent Labs    09/08/17 1652 09/08/17 2124 09/09/17 0703  GLUCAP 154* 129* 106*    Relatively controlled 4/9 10.  Hypertension.  Norvasc 5 mg daily.  Relatively controlled on 4/9 11.  MRSA PCR screen positive.  Contact precautions 12.  Tobacco abuse.  Counseling 13.  Constipation.  Laxative assistance 14. Hyperkalemia   K+ 4.8 on 4/5 15. AKI- resolved   Cr 1.15 on 4/5   LOS (Days) 6 A FACE TO FACE EVALUATION WAS PERFORMED  Jenny Lai Karis Juba 09/09/2017 8:30 AM

## 2017-09-09 NOTE — Progress Notes (Signed)
Occupational Therapy Session Note  Patient Details  Name: Christopher Callahan MRN: [id number] Date of Birth: 03/31/1991  Today's Date: 09/09/2017 OT Individual Time: 0830-0930 OT Individual Time Calculation (min): 60 min    Short Term Goals: No short term goals set  Skilled Therapeutic Interventions/Progress Updates:    Pt seen for ADL training with a focus on strategies for set up to allow pt to be as independent as possible at home. Pt believes w/c will fit into bathroom at home but practiced getting in and out of bathroom with RW as another option. Discussed safety with making sure L foot is fully dry or slipping on a secure slipper to avoid slipping on tile and use of a memory foam bath mat.   Discussed tub bench options and pt stated he would prefer to order one from here, reviewed cost.   Pt practiced covering his residual limb with plastic bag and wrapping it with tape prior to shower. Pt transferred with RW with S to bench in shower and then doffed clothing and bathed on bench with mod I. He then ambulated from shower to wc in room to complete grooming and dressing independently.  Discussed how to perform grooming at the sink by opening cabinet doors so he could put his leg underneath sink or sitting perpendicular to cabinet in w/c.   Reviewed and practiced stretches for shoulder mobility for injury prevention and RTC exercise with theraband. Pt in room with all needs met.      Therapy Documentation Precautions:  Precautions Precautions: Fall Precaution Comments: R BKA Required Braces or Orthoses: Other Brace/Splint Other Brace/Splint: splint for R BK Restrictions Weight Bearing Restrictions: Yes RLE Weight Bearing: Non weight bearing    Vital Signs: Therapy Vitals Temp: 98.4 F (36.9 C) Temp Source: Oral Pulse Rate: 91 Resp: 17 BP: 124/80 Patient Position (if appropriate): Lying Oxygen Therapy SpO2: 99 % O2 Device: Room Air Pain:   ADL:    See Function  Navigator for Current Functional Status.   Therapy/Group: Individual Therapy  SAGUIER,JULIA 09/09/2017, 8:27 AM  

## 2017-09-09 NOTE — Progress Notes (Signed)
Physical Therapy Session Note  Patient Details  Name: Christopher Callahan MRN: [id number] Date of Birth: [birth year]  Today's Date: 09/09/2017 PT Individual Time: 1415-1500 AND 1635-1700 PT Individual Time Calculation (min): 45 min AND 25 min  Short Term Goals: Week 1:  PT Short Term Goal 1 (Week 1): = LTGs  Skilled Therapeutic Interventions/Progress Updates:   Session 1: Pt in supine and agreeable to therapy, no c/o pain. Transferred to w/c w/ set-up assist only as w/c was on other side of room. Focused on global strengthening and endurance this session. Self-propelled w/c around unit at modified independent level and performed arm ergometer @ level 2.0, 5 min x1 bout in each direction, to work on UE strengthening. Performed strengthening exercises while seated at edge of mat as listed below for overall core and UE strengthening for improved strength w/ functional tasks including stair bumping. Returned to room in w/c, ended session in w/c, call bell within reach and all needs met.   Strengthening exercises: -trunk rotations w/ 2 kg ball, 2x20 -2 kg UE press ups, 2x10 -5# dowel rod press out, 2x10  Session 2:  Pt in w/c and agreeable to therapy, no co pain. Worked on functional transfers including multiple floor<>couch transfers and w/c<>couch w/ supervision and verbal cues for technique. Brief rest break in between reps 2/2 UE fatigue. Discussed energy conservation techniques while at home, especially w/ bumping himself up and down steps. Pt verbalized understanding and in agreement. Returned to room and ended session sitting EOB, call bell within reach and all needs met.   Therapy Documentation Precautions:  Precautions Precautions: Fall Precaution Comments: R BKA Required Braces or Orthoses: Other Brace/Splint Other Brace/Splint: splint for R BK Restrictions Weight Bearing Restrictions: Yes RLE Weight Bearing: Non weight bearing Vital Signs: Therapy Vitals Temp: 98.3 F (36.8  C) Temp Source: Oral Pulse Rate: 95 Resp: 18 BP: 132/77 Patient Position (if appropriate): Lying Oxygen Therapy SpO2: 99 % O2 Device: Room Air  See Function Navigator for Current Functional Status.   Therapy/Group: Individual Therapy  Shemaiah Round K Arnette 09/09/2017, 5:05 PM

## 2017-09-09 NOTE — Progress Notes (Signed)
Physical Therapy Session Note  Patient Details  Name: Christopher Callahan MRN:Trevor Mace [id number] Date of Birth: 27-Nov-1990  Today's Date: 09/09/2017 PT Individual Time: 1100-1200 PT Individual Time Calculation (min): 60 min   Short Term Goals: Week 1:  PT Short Term Goal 1 (Week 1): = LTGs  Skilled Therapeutic Interventions/Progress Updates: Pt received seated in w/c, c/o pain as below and agreeable to treatment. Dons R limbguard setupA. W/c propulsion >300' indoors/outdoors on level/unlevel surfaces and slight incline/decline. Provided education regarding propulsion technique for efficiency and shoulder integrity. Gait 4x60' with RW and S on outdoor uneven surfaces; rest breaks between d/t fatigue. Standing LLE heel raises, RLE hip extension x15 reps each. Reviewed education for RLE ROM/strengthening to reduce risk of contracture and prepare for prosthetic training. Discussed RLE desensitization in weight tolerant areas of distal limb that will be utilized in prosthetic, as well as strategies for managing phantom limb pain including touch/massage, and introduced concept of mirror therapy. Pt reports phantom limb pain tolerable currently, has more phantom limb sensations compared to pain. Discussed local amputee support group, providing handout, and discussed with pt/CSW potential for peer support person to come visit pt and pt agreeable. Performed ascent backward/forward descent with RW on 4" curb step min guard, min cues for technique. Returned to room modI w/c propulsion; remained seated in w/c, all needs in reach.      Therapy Documentation Precautions:  Precautions Precautions: Fall Precaution Comments: R BKA Required Braces or Orthoses: Other Brace/Splint Other Brace/Splint: splint for R BK Restrictions Weight Bearing Restrictions: Yes RLE Weight Bearing: Non weight bearing Pain: Pain Assessment Pain Scale: 0-10 Pain Score: 6  Pain Type: Acute pain Pain Location: Leg Pain Orientation:  Right Pain Intervention(s): Medication (See eMAR)   See Function Navigator for Current Functional Status.   Therapy/Group: Individual Therapy  Vista Lawmanlizabeth J Tygielski 09/09/2017, 11:58 AM

## 2017-09-10 ENCOUNTER — Inpatient Hospital Stay (HOSPITAL_COMMUNITY): Payer: Federal, State, Local not specified - PPO | Admitting: Physical Therapy

## 2017-09-10 ENCOUNTER — Inpatient Hospital Stay (HOSPITAL_COMMUNITY): Payer: Federal, State, Local not specified - PPO | Admitting: *Deleted

## 2017-09-10 ENCOUNTER — Inpatient Hospital Stay (HOSPITAL_COMMUNITY): Payer: Federal, State, Local not specified - PPO | Admitting: Occupational Therapy

## 2017-09-10 DIAGNOSIS — E871 Hypo-osmolality and hyponatremia: Secondary | ICD-10-CM

## 2017-09-10 LAB — GLUCOSE, CAPILLARY
GLUCOSE-CAPILLARY: 123 mg/dL — AB (ref 65–99)
GLUCOSE-CAPILLARY: 127 mg/dL — AB (ref 65–99)
GLUCOSE-CAPILLARY: 130 mg/dL — AB (ref 65–99)
GLUCOSE-CAPILLARY: 83 mg/dL (ref 65–99)
Glucose-Capillary: 92 mg/dL (ref 65–99)

## 2017-09-10 NOTE — Discharge Instructions (Signed)
Inpatient Rehab Discharge Instructions  [facility] Discharge date and time: No discharge date for patient encounter.   Activities/Precautions/ Functional Status: Activity: activity as tolerated Diet: diabetic diet Wound Care: keep wound clean and dry Functional status:  ___ No restrictions     ___ Walk up steps independently ___ 24/7 supervision/assistance   ___ Walk up steps with assistance ___ Intermittent supervision/assistance  ___ Bathe/dress independently ___ Walk with walker     _x__ Bathe/dress with assistance ___ Walk Independently    ___ Shower independently ___ Walk with assistance    ___ Shower with assistance ___ No alcohol     ___ Return to work/school ________  Special Instructions: No smoking    COMMUNITY REFERRALS UPON DISCHARGE:    Home Health:   PT & RN   Agency:[facility]   [phone]   Date of last service:09/11/2017  Medical Equipment/Items Ordered:WHEELCHAIR, DROP-ARM BEDSIDE COMMODE & TUB BENCH  Agency/Supplier:[facility]   [phone]   GENERAL COMMUNITY RESOURCES FOR PATIENT/FAMILY: Support Groups:[facility] THE SECOND Tuesday OF EACH MONTH @ [facility] FROM 7:00-8:30 PM. QUESTIONS CONTACT ROBIN [phone]  My questions have been answered and I understand these instructions. I will adhere to these goals and the provided educational materials after my discharge from the hospital.  Patient/Caregiver Signature _______________________________ Date __________  Clinician Signature _______________________________________ Date __________  Please bring this form and your medication list with you to all your follow-up doctor's appointments.

## 2017-09-10 NOTE — Progress Notes (Signed)
Social Work Patient ID: Christopher Callahan, male   DOB: [birth year], 27 y.o.   MRN: [id number]  Met with pt to inform team conference goals mod/i and target discharge tomorrow. He is pleased with this plan and feels ready to go home.

## 2017-09-10 NOTE — Progress Notes (Signed)
Occupational Therapy Session Note  Patient Details  Name: Christopher Callahan MRN: [id number] Date of Birth: [birth year]  Today's Date: 09/10/2017 OT Individual Time: 1300-1410 OT Individual Time Calculation (min): 70 min   Short Term Goals: Week 1:  OT Short Term Goal 1 (Week 1): STGs equal to LTGs based on ELOS  Skilled Therapeutic Interventions/Progress Updates:    Pt greeted in bed and agreeable to OT treatment session. Pt donned limb guard without assist, then OT set-up wc,  pt completed stand-pivot transfer mod I.  BUE strength/coordination with wc propulsion to dayroom. Standing balance/endurance with wii bowling game. Pt tolerated standing for ~15 mins while playing game. Progressed to Wii tennis game to increase challenge by swinging remotes harder on on both sides of body. Pt able to utilize hip, knee, and ankle strategies to maintain standing balance on L LE with unilateral UE support. Discussed home set-up and modifications for safe BADL participation. Also discussed energy conservation techniques. Pt feels confident in plans for dc. Pt propelled wc back to room and set-up wc to transfer back to bed without cues and Mod I. Pt left semi-reclined with needs met.   Therapy Documentation Precautions:  Precautions Precautions: Fall Precaution Comments: R BKA Required Braces or Orthoses: Other Brace/Splint Other Brace/Splint: splint for R BK Restrictions Weight Bearing Restrictions: Yes RLE Weight Bearing: Non weight bearing Pain:  none/denies pain  See Function Navigator for Current Functional Status.   Therapy/Group: Individual Therapy  Valma Cava 09/10/2017, 2:13 PM

## 2017-09-10 NOTE — Progress Notes (Signed)
[facility]ICINE & REHABILITATION     PROGRESS NOTE  Subjective/Complaints:  Patient seen lying in bed this morning. He states he slept well overnight. He hopes to be discharged soon.  ROS: denies CP, SOB, nausea, vomiting, diarrhea.  Objective: Vital Signs: Blood pressure 126/73, pulse 89, temperature (!) 97.5 F (36.4 C), temperature source Oral, resp. rate 18, height 6\' 1"  (1.854 m), weight 102 kg (224 lb 13.9 oz), SpO2 100 %. No results found. No results for input(s): WBC, HGB, HCT, PLT in the last 72 hours. No results for input(s): NA, K, CL, GLUCOSE, BUN, CREATININE, CALCIUM in the last 72 hours.  Invalid input(s): CO CBG (last 3)  Recent Labs    09/09/17 2053 09/10/17 0013 09/10/17 0638  GLUCAP 92 127* 92    Wt Readings from Last 3 Encounters:  09/03/17 102 kg (224 lb 13.9 oz)  08/27/17 106.6 kg (235 lb)  09/02/16 100.2 kg (221 lb)    Physical Exam:  BP 126/73 (BP Location: Left Arm)   Pulse 89   Temp (!) 97.5 F (36.4 C) (Oral)   Resp 18   Ht 6\' 1"  (1.854 m)   Wt 102 kg (224 lb 13.9 oz)   SpO2 100%   BMI 29.67 kg/m  Constitutional: He appears well-developed. Obese  HENT: Normocephalic and atraumatic.  Eyes: EOM are normal. No discharge.  Cardiovascular: RRR and no JVD. Respiratory: Effort normal and breath sounds normal.  GI: Bowel sounds are normal. He exhibits no distension.  Musculoskeletal: RLE stump with tenderness  Neurological: He is alert and oriented.  Speech clear.  Able to follow basic commands without difficulty.  Motor: B/l UE 5/5.  LLE 4--4/5 HF, 4/5 KE, ADF.  RLE: HF 4/5 (stable) Skin: Right BKA with dressing C/D/I  Assessment/Plan: 1. Functional deficits secondary to right BKA which require 3+ hours per day of interdisciplinary therapy in a comprehensive inpatient rehab setting. Physiatrist is providing close team supervision and 24 hour management of active medical problems listed below. Physiatrist and rehab team  continue to assess barriers to discharge/monitor patient progress toward functional and medical goals.  Function:  Bathing Bathing position   Position: Shower  Bathing parts Body parts bathed by patient: Right arm, Left arm, Chest, Abdomen, Buttocks, Front perineal area, Right upper leg, Left upper leg, Left lower leg, Back    Bathing assist Assist Level: More than reasonable time      Upper Body Dressing/Undressing Upper body dressing   What is the patient wearing?: Pull over shirt/dress     Pull over shirt/dress - Perfomed by patient: Thread/unthread right sleeve, Thread/unthread left sleeve, Put head through opening, Pull shirt over trunk          Upper body assist Assist Level: No help, No cues      Lower Body Dressing/Undressing Lower body dressing   What is the patient wearing?: Underwear, Pants, Non-skid slipper socks Underwear - Performed by patient: Thread/unthread right underwear leg, Thread/unthread left underwear leg, Pull underwear up/down   Pants- Performed by patient: Thread/unthread right pants leg, Thread/unthread left pants leg, Pull pants up/down   Non-skid slipper socks- Performed by patient: Don/doff left sock                    Lower body assist Assist for lower body dressing: No Help, No cues      Toileting Toileting   Toileting steps completed by patient: Adjust clothing prior to toileting, Performs perineal hygiene, Adjust clothing after toileting  Toileting steps completed by helper: Performs perineal hygiene, Adjust clothing after toileting(per [facility], NT)    Toileting assist Assist level: Touching or steadying assistance (Pt.75%)   Transfers Chair/bed transfer   Chair/bed transfer method: Stand pivot Chair/bed transfer assist level: Supervision or verbal cues Chair/bed transfer assistive device: Armrests     Locomotion Ambulation     Max distance: 60 Assist level: Supervision or verbal cues   Wheelchair   Type:  Manual Max wheelchair distance: 150 Assist Level: No help, No cues, assistive device, takes more than reasonable amount of time  Cognition Comprehension Comprehension assist level: Follows complex conversation/direction with no assist  Expression Expression assist level: Expresses complex ideas: With no assist  Social Interaction Social Interaction assist level: Interacts appropriately with others - No medications needed.  Problem Solving Problem solving assist level: Solves complex problems: Recognizes & self-corrects  Memory Memory assist level: Complete Independence: No helper    Medical Problem List and Plan: 1.  Decreased functional mobility secondary to right BKA 08/27/2017     Cont CIR  2.  DVT Prophylaxis/Anticoagulation: Subcutaneous Lovenox.  Monitor for any bleeding episodes 3. Pain Management: Robaxin and oxycodone as needed 4. Mood: Provide emotional support 5. Neuropsych: This patient is capable of making decisions on his own behalf. 6. Skin/Wound Care: Routine skin checks   Stump shrinker 7. Fluids/Electrolytes/Nutrition: Routine I/O's  8.  Acute blood loss anemia.     Hemoglobin 10.6 on 4/4   Labs ordered for tomorrow   Cont to monitor 9.  Diabetes mellitus with peripheral neuropathy.  Hemoglobin A1c 11.1.  NovoLog 10 units 3 times daily, Lantus insulin 25 units twice daily.  Check blood sugars before meals and at bedtime.  Diabetic teaching   CBG (last 3)  Recent Labs    09/09/17 2053 09/10/17 0013 09/10/17 0638  GLUCAP 92 127* 92    Relatively controlled 4/10 10.  Hypertension.  Norvasc 5 mg daily.  Relatively controlled on 4/10 11.  MRSA PCR screen positive.  Contact precautions 12.  Tobacco abuse.  Counseling 13.  Constipation.  Laxative assistance 14. Hyperkalemia   K+ 4.8 on 4/5   Labs ordered for tomorrow 15. AKI- resolved   Cr 1.15 on 4/5   Labs ordered for tomorrow 16. Hyponatremia   Sodium 134 on 4/5   Labs ordered for tomorrow   LOS (Days)  7 A FACE TO FACE EVALUATION WAS PERFORMED  Ankit Karis Juba 09/10/2017 8:11 AM

## 2017-09-10 NOTE — Progress Notes (Signed)
Physical Therapy Discharge Summary  Patient Details  Name: Christopher Callahan MRN: [id number] Date of Birth: September 01, 1990   Patient has met 10 of 10 long term goals due to improved activity tolerance, improved balance, improved postural control, increased strength, decreased pain and ability to compensate for deficits.  Patient to discharge at an ambulatory level Modified Independent.   Patient's care partner is independent to provide the necessary physical assistance at discharge.  Reasons goals not met: All goals met  Recommendation:  Patient will benefit from ongoing skilled PT services in home health setting to continue to advance safe functional mobility, address ongoing impairments in strength, balance, activity tolerance, ROM, and minimize fall risk.  Equipment: W/c  Reasons for discharge: treatment goals met and discharge from hospital  Patient/family agrees with progress made and goals achieved: Yes  PT Discharge Precautions/Restrictions Precautions Precautions: Fall Precaution Comments: R BKA Required Braces or Orthoses: Other Brace/Splint Other Brace/Splint: limbguard Restrictions Weight Bearing Restrictions: Yes RLE Weight Bearing: Non weight bearing Vital Signs Therapy Vitals Temp: 98.8 F (37.1 C) Temp Source: Oral Pulse Rate: 90 Resp: 18 BP: 132/77 Patient Position (if appropriate): Lying Oxygen Therapy SpO2: 100 % O2 Device: Room Air Vision/Perception    WFL Cognition Overall Cognitive Status: Within Functional Limits for tasks assessed Arousal/Alertness: Awake/alert Orientation Level: Oriented X4 Attention: Sustained Sustained Attention: Appears intact Memory: Appears intact Awareness: Appears intact Problem Solving: Appears intact Safety/Judgment: Appears intact Sensation Sensation Light Touch: Appears Intact Stereognosis: Appears Intact Hot/Cold: Appears Intact Proprioception: Appears Intact Additional Comments: Sensation intact in BUEs.   Reports neuropathy in his LEs Coordination Gross Motor Movements are Fluid and Coordinated: Yes Fine Motor Movements are Fluid and Coordinated: Yes Motor  Motor Motor: Within Functional Limits Motor - Skilled Clinical Observations: generalized weakness  Mobility Bed Mobility Bed Mobility: Sit to Supine;Supine to Sit Supine to Sit: 6: Modified independent (Device/Increase time) Sit to Supine: 6: Modified independent (Device/Increase time) Transfers Transfers: Yes Sit to Stand: 6: Modified independent (Device/Increase time);With armrests Stand to Sit: 6: Modified independent (Device/Increase time);With armrests Stand Pivot Transfers: 6: Modified independent (Device/Increase time);With armrests Squat Pivot Transfers: 6: Modified independent (Device/Increase time);With upper extremity assistance Locomotion  Ambulation Ambulation: Yes Ambulation/Gait Assistance: 6: Modified independent (Device/Increase time) Ambulation Distance (Feet): 75 Feet Assistive device: Rolling walker Gait Gait: Yes Gait Pattern: Impaired Gait Pattern: Poor foot clearance - left Stairs / Additional Locomotion Stairs: Yes Stairs Assistance: 5: Supervision Stair Management Technique: Seated/boosting Number of Stairs: 12 Height of Stairs: 6 Ramp: 5: Supervision Curb: 5: Psychiatric nurse: Yes Wheelchair Assistance: 6: Modified independent (Device/Increase time) Environmental health practitioner: Both upper extremities Wheelchair Parts Management: Independent Distance: >300'  Trunk/Postural Assessment  Cervical Assessment Cervical Assessment: Within Functional Limits Thoracic Assessment Thoracic Assessment: Within Functional Limits Lumbar Assessment Lumbar Assessment: Within Functional Limits Postural Control Postural Control: Within Functional Limits  Balance Balance Balance Assessed: Yes Static Sitting Balance Static Sitting - Balance Support: No upper extremity  supported Static Sitting - Level of Assistance: 6: Modified independent (Device/Increase time) Dynamic Sitting Balance Dynamic Sitting - Balance Support: During functional activity Dynamic Sitting - Level of Assistance: 6: Modified independent (Device/Increase time) Static Standing Balance Static Standing - Balance Support: During functional activity Dynamic Standing Balance Dynamic Standing - Balance Support: During functional activity Dynamic Standing - Level of Assistance: 6: Modified independent (Device/Increase time) Extremity Assessment  RUE Assessment RUE Assessment: Within Functional Limits LUE Assessment LUE Assessment: Within Functional Limits RLE Assessment RLE Assessment: Exceptions to [facility] RLE Strength RLE  Overall Strength Comments: grossly 4/5 and ROM WFL for residual limb LLE Assessment LLE Assessment: Within Functional Limits   See Function Navigator for Current Functional Status.  Christopher Callahan 09/11/2017, 7:55 AM

## 2017-09-10 NOTE — Progress Notes (Signed)
Occupational Therapy Discharge Summary  Patient Details  Name: Christopher Callahan MRN: [id number] Date of Birth: Dec 25, 1990  Today's Date: 09/10/2017 OT Individual Time: 1300-1410 OT Individual Time Calculation (min): 70 min    Patient has met 9 of 9 long term goals due to improved balance and ability to compensate for deficits.  Patient to discharge at overall Modified Independent level.  Patient can direct care as needed and did not require family education.  Reasons goals not met: NA  Recommendation:  Patient will benefit from ongoing skilled OT services in home health setting to continue to advance functional skills in the area of BADL and Vocation.  Pt will benefit from [place] eval for safety.    Equipment: tub bench, drop arm commode  Reasons for discharge: treatment goals met and discharge from hospital  Patient/family agrees with progress made and goals achieved: Yes  OT Discharge Precautions/Restrictions   fall, L BKA  Pain  No report of pain during session  ADL  See Function Section of chart for details  Vision Baseline Vision/History: No visual deficits Patient Visual Report: No change from baseline Perception  Perception: Within Functional Limits Praxis Praxis: Intact Cognition Overall Cognitive Status: Within Functional Limits for tasks assessed Arousal/Alertness: Awake/alert Orientation Level: Oriented X4 Attention: Sustained Sustained Attention: Appears intact Memory: Appears intact Awareness: Appears intact Problem Solving: Appears intact Safety/Judgment: Appears intact Sensation Sensation Light Touch: Appears Intact Stereognosis: Appears Intact Hot/Cold: Appears Intact Proprioception: Appears Intact Additional Comments: Sensation intact in BUEs.  Reports neuropathy in his LEs Coordination Gross Motor Movements are Fluid and Coordinated: Yes Fine Motor Movements are Fluid and Coordinated: Yes Motor  Motor Motor: Within Functional  Limits Mobility  Transfers Transfers: Sit to Stand;Stand to Sit Sit to Stand: 6: Modified independent (Device/Increase time);With upper extremity assist;From chair/3-in-1 Stand to Sit: 6: Modified independent (Device/Increase time);With upper extremity assist;To chair/3-in-1  Trunk/Postural Assessment  Cervical Assessment Cervical Assessment: Within Functional Limits Thoracic Assessment Thoracic Assessment: Within Functional Limits Lumbar Assessment Lumbar Assessment: Within Functional Limits Postural Control Postural Control: Within Functional Limits  Balance Static Sitting Balance Static Sitting - Balance Support: No upper extremity supported Static Sitting - Level of Assistance: 6: Modified independent (Device/Increase time) Dynamic Sitting Balance Dynamic Sitting - Balance Support: During functional activity Dynamic Sitting - Level of Assistance: 6: Modified independent (Device/Increase time) Static Standing Balance Static Standing - Balance Support: During functional activity Static Standing - Level of Assistance: 6: Modified independent (Device/Increase time) Dynamic Standing Balance Dynamic Standing - Balance Support: During functional activity Extremity/Trunk Assessment RUE Assessment RUE Assessment: Within Functional Limits LUE Assessment LUE Assessment: Within Functional Limits   See Function Navigator for Current Functional Status.  Edel Rivero OTR/L 09/10/2017, 4:21 PM

## 2017-09-10 NOTE — Patient Care Conference (Signed)
Inpatient RehabilitationTeam Conference and Plan of Care Update Date: 09/10/2017   Time: 2:10 PM    Patient Name: Christopher Callahan      Medical Record Number: [id number]  Date of Birth: 30-Apr-1991 Sex: Male         Room/Bed: 4M05C/4M05C-01 Payor Info: Payor: BLUE CROSS BLUE [employer]IELD / Plan: BCBS/[employer] PPO / Product Type: *No Product type* /    Admitting Diagnosis: BKA  Admit Date/Time:  09/03/2017  5:54 PM Admission Comments: No comment available   Primary Diagnosis:  <principal problem not specified> Principal Problem: <principal problem not specified>  Patient Active Problem List   Diagnosis Date Noted  . Hyponatremia   . Unilateral complete BKA, right, sequela (HCC)   . Poorly controlled type 2 diabetes mellitus with peripheral neuropathy (HCC)   . Hyperkalemia   . AKI (acute kidney injury) (HCC)   . S/P below knee amputation, right (HCC) 09/03/2017  . Drug induced constipation   . Tobacco abuse   . Benign essential HTN   . Type 2 diabetes mellitus with peripheral neuropathy (HCC)   . Acute blood loss anemia   . Post-operative pain   . Subacute osteomyelitis, right ankle and foot (HCC)   . Diabetic foot infection (HCC)   . Sepsis (HCC)   . Severe sepsis (HCC) 08/24/2017  . Essential hypertension 09/04/2016  . Poorly controlled type 1 diabetes mellitus with complication (HCC) 09/03/2016  . Diabetic gastroparesis associated with type 1 diabetes mellitus (HCC) 09/03/2016    Expected Discharge Date: Expected Discharge Date: 09/11/17  Team Members Present: Physician leading conference: Dr. Maryla MorrowAnkit Patel Nurse Present: Vincente PoliAwndrea Troxler, RN PT Present: Alyson ReedyElizabeth Tygielski, PT OT Present: Perrin MalteseJames McGuire, OT PPS Coordinator present : Tora DuckMarie Noel, RN, CRRN     Current Status/Progress Goal Weekly Team Focus  Medical   Decreased functional mobility secondary to right BKA 08/27/2017  Improve mobility, transfers, safety, electrolytes, DM  See above   Bowel/Bladder   LBM 09/08/2017;  Continent of B/B  Remain continent of B/B  Assess LBM daily on each shift and continue taking Colace 100mg  twice daily    Swallow/Nutrition/ Hydration             ADL's   supervision with transfers with RW, mod I with self care  mod I   dynamic balance, RW training, pt education   Mobility   modI overall, S floor>couch transfers for home/stair access  modI overall  activity tolerance, LE ROM/strengthening, ongoing amputee education   Communication             Safety/Cognition/ Behavioral Observations            Pain   No c/o pain at this time   Pain level below 2.  Assess pain on every shift and give prn medications as directed    Skin   Right BKA with staples and scant drainage   Remain free of infection  Assess every shift and as needed; Notify MD of any changes       *See Care Plan and progress notes for long and short-term goals.     Barriers to Discharge  Current Status/Progress Possible Resolutions Date Resolved   Physician    Medical stability;Wound Care;Weight bearing restrictions     See above  Therapies, follow labs, optimize DM meds      Nursing                  PT  OT                  SLP                SW                Discharge Planning/Teaching Needs:  HOme with Mom who works form hom and can prvodie supervision level. Pt needs much educaiton regarding his diabetes-nursing providing      Team Discussion:  reachhing goals of mod/ I level. Strengthenign and activity tolerance is improved. Nursing educating regardign diabetes type 1 and importance of being compliant. Pain controlled and has shrinker. Will have home health therapies  Revisions to Treatment Plan:  DC 4/11    Continued Need for Acute Rehabilitation Level of Care: The patient requires daily medical management by a physician with specialized training in physical medicine and rehabilitation for the following conditions: Daily direction of a multidisciplinary physical  rehabilitation program to ensure safe treatment while eliciting the highest outcome that is of practical value to the patient.: Yes Daily medical management of patient stability for increased activity during participation in an intensive rehabilitation regime.: Yes Daily analysis of laboratory values and/or radiology reports with any subsequent need for medication adjustment of medical intervention for : Post surgical problems;Wound care problems;Diabetes problems  Lucy Chris 09/10/2017, 2:38 PM

## 2017-09-10 NOTE — Progress Notes (Signed)
Physical Therapy Session Note  Patient Details  Name: [name] MRN: [id number] Date of Birth: 16-Sep-1990  Today's Date: 09/10/2017 PT Concurrent Time: 1100-1200 PT Concurrent Time Calculation (min): 60 min  Short Term Goals: Week 1:  PT Short Term Goal 1 (Week 1): = LTGs  Skilled Therapeutic Interventions/Progress Updates: Pt received seated in w/c, denies pain and agreeable to treatment. W/c propulsion modI throughout session BUE. Stand pivot transfers modI w/c <>mat table, bed. Performed prone lying x3 min for hip flexor stretch. Prone lying trunk extension "superman" x15 reps. Performed car transfer with S stand pivot. Gait training x45' with RW and S. During rest break, pt discussed with recreational therapist strategies for managing community outings, including accessing community bathroom, energy conservation, w/c access, etc. Reviewed recommendations for residual limb and sound limb care at d/c, including skin inspection, wearing shoe on intact limb for skin protection, use of shrinker and performing ROM/strengthening HEP to prepare for prosthetic fit/training. W/c propulsion relay 3x150' each with BUE for strengthening and aerobic endurance. Remained seated in w/c at end of session.      Therapy Documentation Precautions:  Precautions Precautions: Fall Precaution Comments: R BKA Required Braces or Orthoses: Other Brace/Splint Other Brace/Splint: splint for R BK Restrictions Weight Bearing Restrictions: Yes RLE Weight Bearing: Non weight bearing Pain: Pain Assessment Pain Score: 0-No pain  See Function Navigator for Current Functional Status.   Therapy/Group: Concurrent  Vista Lawmanlizabeth J Tygielski 09/10/2017, 12:05 PM

## 2017-09-10 NOTE — Progress Notes (Signed)
CBG 92; Snack provided; CBG rechecked resulted Lantus administered continue to monitor

## 2017-09-10 NOTE — Progress Notes (Signed)
Occupational Therapy Session Note  Patient Details  Name: Christopher Callahan MRN: [id number] Date of Birth: 04-13-1991  Today's Date: 09/10/2017 OT Individual Time: 0900-1000 OT Individual Time Calculation (min): 60 min    Short Term Goals: Week 1:  OT Short Term Goal 1 (Week 1): STGs equal to LTGs based on ELOS  Skilled Therapeutic Interventions/Progress Updates:    Pt completed bathing and dressing sit to stand during session.  Supervision for transfers into the shower and out of the shower with use of the RW and the tub bench.  He was able to complete all bathing in sitting on the shower seat with modified independence.  He completed dressing sit to stand from the wheelchair with modified independence as well.  Had pt ambulate with the RW and supervision to clean up towels and washcloths as well at end of session and dirty clothing that he removed.  Therapist applied padding to limb guard at distal end for comfort to complete session.  Pt left in wheelchair with call button and phone in reach.  Therapy Documentation Precautions:  Precautions Precautions: Fall Precaution Comments: R BKA Required Braces or Orthoses: Other Brace/Splint Other Brace/Splint: splint for R BK Restrictions Weight Bearing Restrictions: Yes RLE Weight Bearing: Non weight bearing  Pain: Pain Assessment Pain Scale: 0-10 Pain Score: 0-No pain Pain Type: Acute pain Pain Location: Leg Pain Orientation: Right Pain Intervention(s): Medication (See eMAR) ADL: See Function Navigator for Current Functional Status.   Therapy/Group: Individual Therapy  Anila Bojarski OTR/L 09/10/2017, 10:00 AM

## 2017-09-11 ENCOUNTER — Inpatient Hospital Stay (HOSPITAL_COMMUNITY): Payer: Federal, State, Local not specified - PPO | Admitting: Physical Therapy

## 2017-09-11 ENCOUNTER — Inpatient Hospital Stay (HOSPITAL_COMMUNITY): Payer: Federal, State, Local not specified - PPO | Admitting: Occupational Therapy

## 2017-09-11 LAB — CBC WITH DIFFERENTIAL/PLATELET
BASOS ABS: 0.1 10*3/uL (ref 0.0–0.1)
BASOS PCT: 2 %
EOS PCT: 3 %
Eosinophils Absolute: 0.2 10*3/uL (ref 0.0–0.7)
HCT: 30.8 % — ABNORMAL LOW (ref 39.0–52.0)
Hemoglobin: 10.2 g/dL — ABNORMAL LOW (ref 13.0–17.0)
Lymphocytes Relative: 44 %
Lymphs Abs: 2.7 10*3/uL (ref 0.7–4.0)
MCH: 27 pg (ref 26.0–34.0)
MCHC: 33.1 g/dL (ref 30.0–36.0)
MCV: 81.5 fL (ref 78.0–100.0)
MONOS PCT: 6 %
Monocytes Absolute: 0.3 10*3/uL (ref 0.1–1.0)
Neutro Abs: 2.8 10*3/uL (ref 1.7–7.7)
Neutrophils Relative %: 45 %
PLATELETS: 325 10*3/uL (ref 150–400)
RBC: 3.78 MIL/uL — ABNORMAL LOW (ref 4.22–5.81)
RDW: 12.8 % (ref 11.5–15.5)
WBC: 5.7 10*3/uL (ref 4.0–10.5)

## 2017-09-11 LAB — BASIC METABOLIC PANEL
Anion gap: 10 (ref 5–15)
BUN: 14 mg/dL (ref 6–20)
CALCIUM: 8.9 mg/dL (ref 8.9–10.3)
CO2: 24 mmol/L (ref 22–32)
CREATININE: 0.91 mg/dL (ref 0.61–1.24)
Chloride: 105 mmol/L (ref 101–111)
GFR calc Af Amer: >60 mL/min (ref >60)
Glucose, Bld: 69 mg/dL (ref 65–99)
Potassium: 4.1 mmol/L (ref 3.5–5.1)
Sodium: 139 mmol/L (ref 135–145)

## 2017-09-11 LAB — GLUCOSE, CAPILLARY: Glucose-Capillary: 73 mg/dL (ref 65–99)

## 2017-09-11 MED ORDER — METHOCARBAMOL 500 MG PO TABS: 500.0000 mg | ORAL_TABLET | Freq: Four times a day (QID) | ORAL | 0 refills | Status: DC | PRN

## 2017-09-11 MED ORDER — BLOOD GLUCOSE METER KIT: PACK | 0 refills | Status: DC

## 2017-09-11 MED ORDER — OXYCODONE HCL 5 MG PO TABS: 5.0000 mg | ORAL_TABLET | ORAL | 0 refills | Status: DC | PRN

## 2017-09-11 MED ORDER — INSULIN ASPART 100 UNIT/ML FLEXPEN: 10.0000 [IU] | PEN_INJECTOR | Freq: Three times a day (TID) | SUBCUTANEOUS | 11 refills | Status: AC

## 2017-09-11 MED ORDER — INSULIN DETEMIR 100 UNIT/ML FLEXPEN: 24.0000 [IU] | PEN_INJECTOR | Freq: Two times a day (BID) | SUBCUTANEOUS | 0 refills | Status: AC

## 2017-09-11 MED ORDER — BISACODYL 5 MG PO TBEC: 5.0000 mg | DELAYED_RELEASE_TABLET | Freq: Every day | ORAL | 0 refills | Status: DC

## 2017-09-11 MED ORDER — AMLODIPINE BESYLATE 5 MG PO TABS: 5.0000 mg | ORAL_TABLET | Freq: Every day | ORAL | 0 refills | Status: DC

## 2017-09-11 NOTE — Progress Notes (Signed)
Patient and family were informed about discharged instructions before they left the hospital.

## 2017-09-11 NOTE — Discharge Summary (Signed)
NAME:  Christopher Callahan, Christopher Callahan                  ACCOUNT NO.:  MEDICAL RECORD NO.:  [id number]  LOCATION:                                 FACILITY:  PHYSICIAN:  Maryla Morrow, MD        DATE OF BIRTH:  Feb 23, 1991  DATE OF ADMISSION:  09/03/2017 DATE OF DISCHARGE:  09/11/2017                              DISCHARGE SUMMARY   DISCHARGE DIAGNOSES: 1. Right below-knee amputation, August 27, 2017.  Subcutaneous Lovenox     for DVT prophylaxis. 2. Acute blood loss anemia. 3. Diabetes mellitus. 4. Peripheral neuropathy. 5. Hypertension. 6. Methicillin-resistant Staphylococcus aureus, but PCR screening     positive. 7. Tobacco abuse. 8. Constipation. 9. Acute kidney injury.  HISTORY OF PRESENT ILLNESS:  This is a 27 year old right-handed male with history of hypertension, diabetes mellitus, tobacco abuse, chronic diabetic foot ulcers.  He lives with his mother in 1-level home.  He was admitted on August 24, 2017, with ulceration of the right foot, increased drainage.  Maintained on oral antibiotics.  No change with conservative care.  Suspect sepsis, osteomyelitis of the right foot with abscess. Noted elevated blood sugars, 856.  Orthopedic Services, Dr. Lajoyce Corners, consulted.  Underwent right BKA, August 27, 2017.  Postoperative pain management.  Acute blood loss anemia, 10.5.  Subcutaneous Lovenox for DVT prophylaxis.  MRSA PCR screening positive with contact precautions. The patient was admitted for a comprehensive rehab program.  PAST MEDICAL HISTORY:  See discharge diagnoses.  SOCIAL HISTORY:  Lives with mother.  Independent prior to admission.  Functional status upon admission to Rehab Services was minimal guard 45 feet rolling walker, minimal guard stand pivot transfers, min-mod assist activities of daily living.  PHYSICAL EXAMINATION:  VITAL SIGNS:  Blood pressure 140/82, pulse 95, temperature 98, respirations 18. GENERAL:  Alert male in no acute distress. HEENT:  EOMs intact. NECK:   Supple.  Nontender.  No JVD. CARDIAC:  Rate controlled. ABDOMEN:  Soft, nontender.  Good bowel sounds. LUNGS:  Clear to auscultation without wheeze. EXTREMITIES:  Right BKA site was dressed with a wound VAC in place.  REHABILITATION HOSPITAL COURSE:  The patient was admitted to Inpatient Rehab Services with therapies initiated on a 3-hour daily basis consisting of physical therapy, occupational therapy, and rehabilitation nursing.  The following issues were addressed during the patient's rehabilitation stay.  Pertaining to Mr. Sadlon, right BKA surgical site healing nicely.  He would follow up with Orthopedic Services, Dr. Lajoyce Corners. Subcutaneous Lovenox for DVT prophylaxis.  No bleeding episodes.  Pain management with the use of Robaxin and oxycodone as needed.  Diabetes mellitus, peripheral neuropathy, poorly controlled.  Hemoglobin A1c of 11.  Insulin therapy as directed with Lantus and Levemir was resumed at discharge secondary to patient's insurance.  Full diabetic teaching.  He would follow up with his primary MD.  Blood pressures remained controlled on Norvasc.  Noted acute kidney injury.  Creatinine stable at 1.15.  Bouts of constipation resolved with laxative assistance.  The patient received weekly collaborative interdisciplinary team conferences to discuss estimated length of stay, family teaching, any barriers to discharge. Performed car transfers with supervision and stand pivot, ambulating 45 feet, rolling walker supervision.  He could gather his belongings for activities of daily living and homemaking.  Performed prone lying x3 minimal assistance.  He was discharged to home.  DISCHARGE MEDICATIONS:  Included: 1. Norvasc 5 mg p.o. daily. 2. Dulcolax tablet daily.  Hold for loose stools. 3. NovoLog insulin 10 units t.i.d. 4.  Levemir 24 units twice daily 5. Robaxin 500 mg p.o. every 6 hours as needed, muscle spasms. 6. Oxycodone 5-10 mg every 4 hours as needed pain.  DIET:   His diet was a diabetic diet.  FOLLOWUP:  He would follow up with Dr. Maryla MorrowAnkit Patel at the Outpatient Rehab Service Office as directed; Dr. Aldean BakerMarcus Duda, Orthopedic Services, call for appointment; Dr. Jacquelynn CreeAngela Coleman, medical management.  SPECIAL INSTRUCTIONS:  No smoking or driving.     Christopher Callahan, P.A.   ______________________________ Maryla MorrowAnkit Patel, MD    DA/MEDQ  D:  09/11/2017  T:  09/11/2017  Job:  [id number]  cc:   Nadara MustardMarcus V. Duda, MD Dr. Jacquelynn CreeAngela Coleman Maryla MorrowAnkit Patel, MD

## 2017-09-11 NOTE — Progress Notes (Signed)
Social Work  Discharge Note  The overall goal for the admission was met for:   Discharge location: Yes-HOME WITH MOM WHO CAN PROVIDE 24 HR SUPERVISION   Length of Stay: Yes-8 DAYS  Discharge activity level: Yes-MOD/I LEVEL  Home/community participation: Yes  Services provided included: MD, RD, PT, OT, RN, CM, TR, Pharmacy, Neuropsych and SW  Financial Services: Private Insurance: [employer] BCBS  Follow-up services arranged: Home Health: [facility], DME: [facility] and Patient/Family has no preference for HH/DME agencies  Comments (or additional information):PT REACHED MOD/I LEVEL AND IS READY TO [place] MOM. AWARE NEEDS TO BE COMPLIANT WITH HIS DIABETES AND MEDICINES.  Patient/Family verbalized understanding of follow-up arrangements: Yes  Individual responsible for coordination of the follow-up plan: SELF & KITINA-MOM  Confirmed correct DME delivered: Elease Hashimoto 09/11/2017    Elease Hashimoto

## 2017-09-11 NOTE — Discharge Summary (Signed)
Discharge summary job [id number]

## 2017-09-11 NOTE — Progress Notes (Signed)
[facility] PHYSICAL MEDICINE & REHABILITATION     PROGRESS NOTE  Subjective/Complaints:  Patient seen sitting up in his chair this morning. He states he slept well overnight. He states he is ready for discharge.  ROS: Denies CP, SOB, nausea, vomiting, diarrhea.  Objective: Vital Signs: Blood pressure 132/77, pulse 90, temperature 98.8 F (37.1 C), temperature source Oral, resp. rate 18, height 6\' 1"  (1.854 m), weight 102 kg (224 lb 13.9 oz), SpO2 100 %. No results found. Recent Labs    09/11/17 0618  WBC 5.7  HGB 10.2*  HCT 30.8*  PLT 325   No results for input(s): NA, K, CL, GLUCOSE, BUN, CREATININE, CALCIUM in the last 72 hours.  Invalid input(s): CO CBG (last 3)  Recent Labs    09/10/17 1652 09/10/17 2115 09/11/17 0615  GLUCAP 123* 130* 73    Wt Readings from Last 3 Encounters:  09/03/17 102 kg (224 lb 13.9 oz)  08/27/17 106.6 kg (235 lb)  09/02/16 100.2 kg (221 lb)    Physical Exam:  BP 132/77 (BP Location: Left Arm)   Pulse 90   Temp 98.8 F (37.1 C) (Oral)   Resp 18   Ht 6\' 1"  (1.854 m)   Wt 102 kg (224 lb 13.9 oz)   SpO2 100%   BMI 29.67 kg/m  Constitutional: He appears well-developed. Obese  HENT: Normocephalic and atraumatic.  Eyes: EOM are normal. No discharge.  Cardiovascular: RRR and no JVD. Respiratory: Effort normal and breath sounds normal.  GI: Bowel sounds are normal. He exhibits no distension.  Musculoskeletal: RLE stump with tenderness  Neurological: He is alert and oriented.  Speech clear.  Able to follow basic commands without difficulty.  Motor: B/l UE 5/5.  LLE 4--4/5 HF, 4/5 KE, ADF.  RLE: HF 4/5 (unchanged) Skin: Right BKA with dressing C/D/I  Assessment/Plan: 1. Functional deficits secondary to right BKA which require 3+ hours per day of interdisciplinary therapy in a comprehensive inpatient rehab setting. Physiatrist is providing close team supervision and 24 hour management of active medical problems listed  below. Physiatrist and rehab team continue to assess barriers to discharge/monitor patient progress toward functional and medical goals.  Function:  Bathing Bathing position   Position: Shower  Bathing parts Body parts bathed by patient: Right arm, Left arm, Chest, Abdomen, Buttocks, Front perineal area, Right upper leg, Left upper leg, Left lower leg, Back    Bathing assist Assist Level: More than reasonable time      Upper Body Dressing/Undressing Upper body dressing   What is the patient wearing?: Pull over shirt/dress     Pull over shirt/dress - Perfomed by patient: Thread/unthread right sleeve, Thread/unthread left sleeve, Put head through opening, Pull shirt over trunk          Upper body assist Assist Level: No help, No cues      Lower Body Dressing/Undressing Lower body dressing   What is the patient wearing?: Underwear, Pants, Non-skid slipper socks Underwear - Performed by patient: Thread/unthread right underwear leg, Thread/unthread left underwear leg, Pull underwear up/down   Pants- Performed by patient: Thread/unthread right pants leg, Thread/unthread left pants leg, Pull pants up/down   Non-skid slipper socks- Performed by patient: Don/doff left sock                    Lower body assist Assist for lower body dressing: No Help, No cues      Toileting Toileting   Toileting steps completed by patient: Adjust clothing prior  to toileting, Performs perineal hygiene, Adjust clothing after toileting Toileting steps completed by helper: Performs perineal hygiene, Adjust clothing after toileting(per [facility], NT)    Toileting assist Assist level: More than reasonable time   Transfers Chair/bed transfer   Chair/bed transfer method: Stand pivot Chair/bed transfer assist level: No Help, no cues, assistive device, takes more than a reasonable amount of time Chair/bed transfer assistive device: Armrests     Locomotion Ambulation     Max distance:  50 Assist level: Supervision or verbal cues   Wheelchair   Type: Manual Max wheelchair distance: 150 Assist Level: No help, No cues, assistive device, takes more than reasonable amount of time  Cognition Comprehension Comprehension assist level: Follows complex conversation/direction with no assist  Expression Expression assist level: Expresses complex ideas: With no assist  Social Interaction Social Interaction assist level: Interacts appropriately with others - No medications needed.  Problem Solving Problem solving assist level: Solves complex problems: Recognizes & self-corrects  Memory Memory assist level: Complete Independence: No helper    Medical Problem List and Plan: 1.  Decreased functional mobility secondary to right BKA 08/27/2017     DC today  Will see patient for transitional care management in 1-2 weeks  2.  DVT Prophylaxis/Anticoagulation: Subcutaneous Lovenox.  Monitor for any bleeding episodes 3. Pain Management: Robaxin and oxycodone as needed 4. Mood: Provide emotional support 5. Neuropsych: This patient is capable of making decisions on his own behalf. 6. Skin/Wound Care: Routine skin checks   Stump shrinker 7. Fluids/Electrolytes/Nutrition: Routine I/O's  8.  Acute blood loss anemia.     Hemoglobin 10.2 on 4/11   Cont to monitor 9.  Diabetes mellitus with peripheral neuropathy.  Hemoglobin A1c 11.1.  NovoLog 10 units 3 times daily, Lantus insulin 25 units twice daily.  Check blood sugars before meals and at bedtime.  Diabetic teaching   CBG (last 3)  Recent Labs    09/10/17 1652 09/10/17 2115 09/11/17 0615  GLUCAP 123* 130* 73    Relatively controlled on 4/11 10.  Hypertension.  Norvasc 5 mg daily.  Relatively controlled on 4/11 11.  MRSA PCR screen positive.  Contact precautions 12.  Tobacco abuse.  Counseling 13.  Constipation.  Laxative assistance 14. Hyperkalemia   K+ 4.8 on 4/5   Labs pending 15. AKI- resolved   Cr 1.15 on 4/5   Labs  pending 16. Hyponatremia   Sodium 134 on 4/5   Labs pending   LOS (Days) 8 A FACE TO FACE EVALUATION WAS PERFORMED  Hawa Henly Karis Juba 09/11/2017 8:52 AM

## 2017-09-12 ENCOUNTER — Telehealth: Payer: Self-pay

## 2017-09-12 NOTE — Telephone Encounter (Signed)
[facility] pharmacy called needing a diagnosis for pt Oxycodone 5mg . Diagnosis given M86.271 and Z89.511

## 2017-09-15 ENCOUNTER — Telehealth: Payer: Self-pay | Admitting: Registered Nurse

## 2017-09-15 ENCOUNTER — Telehealth: Payer: Self-pay | Admitting: *Deleted

## 2017-09-15 NOTE — Telephone Encounter (Signed)
Transitional Care call Transitional Care Call Completed, Appointment Confirmed, Address Confirmed, New Patient Packet Mailed  Patient name: Trevor Macentonio Zadrozny DOB: 01/05/1991 1. Are you/is patient experiencing any problems since coming home? No a. Are there any questions regarding any aspect of care? No 2. Are there any questions regarding medications administration/dosing? No a. Are meds being taken as prescribed? Yes b. "Patient should review meds with caller to confirm"  3. Have there been any falls? No 4. Has Home Health been to the house and/or have they contacted you? Yes, [facility] a. If not, have you tried to contact them? NA b. Can we help you contact them? NA 5. Are bowels and bladder emptying properly? Yes a. Are there any unexpected incontinence issues? No b. If applicable, is patient following bowel/bladder programs? NA 6. Any fevers, problems with breathing, unexpected pain? No 7. Are there any skin problems or new areas of breakdown? No 8. Has the patient/family member arranged specialty MD follow up (ie cardiology/neurology/renal/surgical/etc.)?  Mr. Hilda LiasKeeling given Dr. Lajoyce Cornersuda number he states he will call in the morning. [place] his caseworker helping him to find a new PCP he states.  a. Can we help arrange? No 9. Does the patient need any other services or support that we can help arrange? No 10. Are caregivers following through as expected in assisting the patient? Yes 11. Has the patient quit smoking, drinking alcohol, or using drugs as recommended? Mr. Hilda LiasKeeling states he hasn't smoke or had any alcohol since hospitalization, he denies illicit drugs.   Appointment date/time 09/25/2017 arrival time 1:20 for 1:40 appointment with Dr. Allena KatzPatel at [phone]

## 2017-09-15 NOTE — Telephone Encounter (Signed)
Eric PT with [facility] called to get approval for PT 3wk2 and then 2wk2.  He also needs an order faxed for a 3 n 1 commode sent to DME company or faxed to his office fx [phone]). I notified Minerva Areolaric he may have the POC frequency and that the DME was [facility] at [facility].  He will follow up because it has not arrived.

## 2017-09-15 NOTE — Telephone Encounter (Signed)
Pam RN with [facility] called to request orders for R BKA.  She did not specify what orders she was requesting.  I spoke with someone in [facility] office and she is requesting 1wk1, 2wk3, 1wk2.  Approval given.

## 2017-09-22 ENCOUNTER — Ambulatory Visit (INDEPENDENT_AMBULATORY_CARE_PROVIDER_SITE_OTHER): Payer: Federal, State, Local not specified - PPO | Admitting: Orthopedic Surgery

## 2017-09-22 ENCOUNTER — Encounter (INDEPENDENT_AMBULATORY_CARE_PROVIDER_SITE_OTHER): Payer: Self-pay | Admitting: Orthopedic Surgery

## 2017-09-22 DIAGNOSIS — Z89511 Acquired absence of right leg below knee: Secondary | ICD-10-CM

## 2017-09-22 NOTE — Progress Notes (Signed)
Office Visit Note   [name]           Date of Birth: 11-07-1990           MRN: [id number] Visit Date: 09/22/2017              Requested by: Jacquelynn Cree, PA-C [address] [place], [place] [address] PCP: Jacquelynn Cree, PA-C  Chief Complaint  Patient presents with  . Right Leg - Routine Post Op      HPI: Patient is a 27 year old gentleman who presents status post right transtibial amputation.  He is about 3-1/2 weeks out from surgery.  He has no complaints he has a stump shrinker from biotech.  Assessment & Plan: Visit Diagnoses:  1. Acquired absence of right leg below knee [facility])     Plan: Patient was given a prescription for a [employer] for a K3 level prosthesis.  Patient will follow up with biotech for a smaller shrinker.  Follow-Up Instructions: Return in about 1 month (around 10/20/2017).   Ortho Exam  Patient is alert, oriented, no adenopathy, well-dressed, normal affect, normal respiratory effort. Examination patient has minimal swelling the surgical incision is well-healed there is no drainage no cellulitis no wound dehiscence.  Imaging: No results found. No images are attached to the encounter.  Labs: Lab Results  Component Value Date   HGBA1C 11.1 (H) 08/25/2017   ESRSEDRATE 107 (H) 08/24/2017   CRP 27.6 (H) 08/27/2017   CRP 29.1 (H) 08/24/2017   REPTSTATUS 08/25/2017 FINAL 08/24/2017   CULT  08/24/2017    NO GROWTH Performed at [facility], [address] [place], [place] [address]     @LABSALLVALUES (HGBA1)@  There is no height or weight on file to calculate BMI.  Orders:  No orders of the defined types were placed in this encounter.  No orders of the defined types were placed in this encounter.    Procedures: No procedures performed  Clinical Data: No additional findings.  ROS:  All other systems negative, except as noted in the HPI. Review of Systems  Objective: Vital Signs: There were no vitals taken  for this visit.  Specialty Comments:  No specialty comments available.  PMFS History: Patient Active Problem List   Diagnosis Date Noted  . Hyponatremia   . Unilateral complete BKA, right, sequela (HCC)   . Poorly controlled type 2 diabetes mellitus with peripheral neuropathy (HCC)   . Hyperkalemia   . AKI (acute kidney injury) (HCC)   . S/P below knee amputation, right (HCC) 09/03/2017  . Drug induced constipation   . Tobacco abuse   . Benign essential HTN   . Type 2 diabetes mellitus with peripheral neuropathy (HCC)   . Acute blood loss anemia   . Post-operative pain   . Subacute osteomyelitis, right ankle and foot (HCC)   . Diabetic foot infection (HCC)   . Sepsis (HCC)   . Severe sepsis (HCC) 08/24/2017  . Essential hypertension 09/04/2016  . Poorly controlled type 1 diabetes mellitus with complication (HCC) 09/03/2016  . Diabetic gastroparesis associated with type 1 diabetes mellitus (HCC) 09/03/2016   Past Medical History:  Diagnosis Date  . Diabetes mellitus without complication (HCC)     Family History  Problem Relation Age of Onset  . COPD Father     Past Surgical History:  Procedure Laterality Date  . AMPUTATION Right 08/27/2017   Procedure: RIGHT BELOW KNEE AMPUTATION;  Surgeon: Nadara Mustard, MD;  Location: [facility] OR;  Service:  Orthopedics;  Laterality: Right;  . CHOLECYSTECTOMY    . SKIN GRAFT     Social History   Occupational History  . Not on file  Tobacco Use  . Smoking status: Current Every Day Smoker    Packs/day: 0.30    Types: Cigarettes  . Smokeless tobacco: Never Used  Substance and Sexual Activity  . Alcohol use: No  . Drug use: No  . Sexual activity: Not on file

## 2017-09-23 ENCOUNTER — Telehealth (INDEPENDENT_AMBULATORY_CARE_PROVIDER_SITE_OTHER): Payer: Self-pay

## 2017-09-23 NOTE — Telephone Encounter (Signed)
Patient would like to know if an order can be put in for his HHN, [facility] to remove a staple that was left in his right leg.  Lorin PicketScott works with [facility] [employer].  CB# for patient is [phone].  Please advise.  Thank You.

## 2017-09-24 ENCOUNTER — Other Ambulatory Visit (INDEPENDENT_AMBULATORY_CARE_PROVIDER_SITE_OTHER): Payer: Self-pay

## 2017-09-24 NOTE — Telephone Encounter (Signed)
Order faxed to [facility] to remove retained staple.

## 2017-09-25 ENCOUNTER — Encounter: Payer: Self-pay | Admitting: Physical Medicine & Rehabilitation

## 2017-09-25 ENCOUNTER — Encounter
Payer: Federal, State, Local not specified - PPO | Attending: Physical Medicine & Rehabilitation | Admitting: Physical Medicine & Rehabilitation

## 2017-09-25 VITALS — BP 141/91 | HR 104 | Ht 72.0 in | Wt 224.0 lb

## 2017-09-25 DIAGNOSIS — G8918 Other acute postprocedural pain: Secondary | ICD-10-CM | POA: Diagnosis not present

## 2017-09-25 DIAGNOSIS — Z89511 Acquired absence of right leg below knee: Secondary | ICD-10-CM | POA: Diagnosis not present

## 2017-09-25 DIAGNOSIS — S88111S Complete traumatic amputation at level between knee and ankle, right lower leg, sequela: Secondary | ICD-10-CM | POA: Diagnosis not present

## 2017-09-25 DIAGNOSIS — E1142 Type 2 diabetes mellitus with diabetic polyneuropathy: Secondary | ICD-10-CM | POA: Insufficient documentation

## 2017-09-25 DIAGNOSIS — E1165 Type 2 diabetes mellitus with hyperglycemia: Secondary | ICD-10-CM

## 2017-09-25 DIAGNOSIS — R269 Unspecified abnormalities of gait and mobility: Secondary | ICD-10-CM | POA: Diagnosis not present

## 2017-09-25 DIAGNOSIS — Z720 Tobacco use: Secondary | ICD-10-CM

## 2017-09-25 DIAGNOSIS — Z9049 Acquired absence of other specified parts of digestive tract: Secondary | ICD-10-CM | POA: Insufficient documentation

## 2017-09-25 DIAGNOSIS — D62 Acute posthemorrhagic anemia: Secondary | ICD-10-CM | POA: Diagnosis not present

## 2017-09-25 DIAGNOSIS — F17210 Nicotine dependence, cigarettes, uncomplicated: Secondary | ICD-10-CM | POA: Diagnosis not present

## 2017-09-25 DIAGNOSIS — I10 Essential (primary) hypertension: Secondary | ICD-10-CM | POA: Insufficient documentation

## 2017-09-25 NOTE — Progress Notes (Signed)
Subjective:    Patient ID: Christopher Callahan, male    DOB: 1990-08-08, 27 y.o.   MRN: [id number]  HPI 27 year old right-handed male with history of hypertension, diabetes mellitus, tobacco abuse, chronic diabetic foot ulcers presents for transitional care management after receiving CIR for right BKA.  DATE OF ADMISSION:  09/03/2017 DATE OF DISCHARGE:  09/11/2017  Since discharge, pt states he is doing well, especially after staple removal.  He saw Ortho.  He saw his PCP. He had his HbA1c drawn.  He had routine labs in a couple weeks.  He states CBGs have been relatively controlled. BP is slightly elevated. He states he has not smoked since discharge.   Therapies: 2/week Mobility: Walker in home, wheelchair in community DME: Shower chair, toilet chair.  Pain Inventory Average Pain 2 Pain Right Now 0 My pain is intermittent  In the last 24 hours, has pain interfered with the following? General activity 0 Relation with others 0 Enjoyment of life 0 What TIME of day is your pain at its worst? night Sleep (in general) Fair  Pain is worse with: unsure Pain improves with: medication Relief from Meds: 10  Mobility walk with assistance use a walker how many minutes can you walk? 5 ability to climb steps?  yes do you drive?  no use a wheelchair transfers alone Do you have any goals in this area?  yes  Function disabled: date disabled applied for disability  Neuro/Psych trouble walking spasms  Prior Studies transitional care  Physicians involved in your care transitional care   Family History  Problem Relation Age of Onset  . COPD Father    Social History   Socioeconomic History  . Marital status: Single    Spouse name: Not on file  . Number of children: Not on file  . Years of education: Not on file  . Highest education level: Not on file  Occupational History  . Not on file  Social Needs  . Financial resource strain: Not on file  . Food insecurity:   Worry: Not on file    Inability: Not on file  . Transportation needs:    Medical: Not on file    Non-medical: Not on file  Tobacco Use  . Smoking status: Current Every Day Smoker    Packs/day: 0.30    Types: Cigarettes  . Smokeless tobacco: Never Used  Substance and Sexual Activity  . Alcohol use: No  . Drug use: No  . Sexual activity: Not on file  Lifestyle  . Physical activity:    Days per week: Not on file    Minutes per session: Not on file  . Stress: Not on file  Relationships  . Social connections:    Talks on phone: Not on file    Gets together: Not on file    Attends religious service: Not on file    Active member of club or organization: Not on file    Attends meetings of clubs or organizations: Not on file    Relationship status: Not on file  Other Topics Concern  . Not on file  Social History Narrative  . Not on file   Past Surgical History:  Procedure Laterality Date  . AMPUTATION Right 08/27/2017   Procedure: RIGHT BELOW KNEE AMPUTATION;  Surgeon: Nadara Mustard, MD;  Location: [facility] OR;  Service: Orthopedics;  Laterality: Right;  . CHOLECYSTECTOMY    . SKIN GRAFT     Past Medical History:  Diagnosis Date  . Diabetes mellitus  without complication (HCC)    BP (!) 141/91 (BP Location: Right Arm, Patient Position: Sitting, Cuff Size: Normal)   Pulse (!) 104   Ht 6' (1.829 m)   Wt 224 lb (101.6 kg)   SpO2 98%   BMI 30.38 kg/m   Opioid Risk Score:   Fall Risk Score:  `1  Depression screen PHQ 2/9  Depression screen PHQ 2/9 09/25/2017  Decreased Interest 0  Down, Depressed, Hopeless 1  PHQ - 2 Score 1  Altered sleeping 2  Tired, decreased energy 1  Change in appetite 0  Feeling bad or failure about yourself  2  Trouble concentrating 0  Moving slowly or fidgety/restless 0  Suicidal thoughts 0  PHQ-9 Score 6  Difficult doing work/chores Not difficult at all    Review of Systems  Constitutional: Positive for diaphoresis.  HENT: Negative.     Eyes: Negative.   Respiratory: Negative.   Gastrointestinal: Negative.   Endocrine:       High blood sugar  Genitourinary: Negative.   Musculoskeletal: Positive for gait problem.  Skin: Negative.   Allergic/Immunologic: Negative.   Psychiatric/Behavioral: Negative.   All other systems reviewed and are negative.      Objective:   Physical Exam Constitutional: He appears well-developed. Obese  HENT: Normocephalic and atraumatic.  Eyes: EOM are normal. No discharge.  Cardiovascular: RRR and no JVD. Respiratory: Effort normal and breath sounds normal.  GI: Bowel sounds are normal. He exhibits no distension.  Musculoskeletal: RLE stump with tenderness  Neurological: He is alert and oriented.  Speech clear.  Able to follow basic commands without difficulty.  Motor: B/l UE 5/5.  LLE 4+/5 HF, 4+/5 KE, ADF.  RLE: HF 4+/5  Skin: Right BKA healing    Assessment & Plan:  27 year old right-handed male with history of hypertension, diabetes mellitus, tobacco abuse, chronic diabetic foot ulcers presents for transitional care management after receiving CIR for right BKA.  1. Decreased functional mobility secondary to right BKA 08/27/2017  Cont therapies  Cont follow up with Ortho  Cont shrinker   2. Pain Management  Controlled at present, wean oxy, no UDS needed as will not refill  3.  Acute blood loss anemia.    Cont follow up with PCP, labs to be drawn in a few weeks per pt  4.  Diabetes mellitus with peripheral neuropathy.    Cont meds  Controlled at present per pt  Follow up with PCP for further adjustments  5.  Hypertension.    Slightly elevated today  Cont meds  Encouraged follow up with PCP  6.  Tobacco abuse.    Cont abstinence  7. Gait abnormality  Cont walker/wheelchair for safety  Meds reviewed Referrals reviewed All questions answered  Retained, imbedded suture removed along medial aspect of incision with staple removal and forceps.  Patient tolerated  procedure.  Minimal bleeding, self-resolved.

## 2017-10-07 ENCOUNTER — Emergency Department (HOSPITAL_COMMUNITY)
Admission: EM | Admit: 2017-10-07 | Discharge: 2017-10-07 | Disposition: A | Discharge status: Home / Self Care | Payer: Federal, State, Local not specified - PPO | Attending: Emergency Medicine | Admitting: Emergency Medicine

## 2017-10-07 ENCOUNTER — Encounter (HOSPITAL_COMMUNITY): Payer: Self-pay

## 2017-10-07 ENCOUNTER — Other Ambulatory Visit: Payer: Self-pay

## 2017-10-07 DIAGNOSIS — I10 Essential (primary) hypertension: Secondary | ICD-10-CM | POA: Diagnosis not present

## 2017-10-07 DIAGNOSIS — Z79899 Other long term (current) drug therapy: Secondary | ICD-10-CM | POA: Diagnosis not present

## 2017-10-07 DIAGNOSIS — F17210 Nicotine dependence, cigarettes, uncomplicated: Secondary | ICD-10-CM | POA: Diagnosis not present

## 2017-10-07 DIAGNOSIS — R112 Nausea with vomiting, unspecified: Secondary | ICD-10-CM | POA: Diagnosis present

## 2017-10-07 DIAGNOSIS — K3184 Gastroparesis: Secondary | ICD-10-CM

## 2017-10-07 DIAGNOSIS — E1043 Type 1 diabetes mellitus with diabetic autonomic (poly)neuropathy: Secondary | ICD-10-CM | POA: Insufficient documentation

## 2017-10-07 LAB — URINALYSIS, ROUTINE W REFLEX MICROSCOPIC
BACTERIA UA: NONE SEEN
BILIRUBIN URINE: NEGATIVE
Glucose, UA: NEGATIVE mg/dL
Hgb urine dipstick: NEGATIVE
Ketones, ur: NEGATIVE mg/dL
Leukocytes, UA: NEGATIVE
Nitrite: NEGATIVE
Protein, ur: 100 mg/dL — AB
SPECIFIC GRAVITY, URINE: 1.024 (ref 1.005–1.030)
pH: 7 (ref 5.0–8.0)

## 2017-10-07 LAB — COMPREHENSIVE METABOLIC PANEL
ALBUMIN: 4.2 g/dL (ref 3.5–5.0)
ALT: 13 U/L — ABNORMAL LOW (ref 17–63)
ANION GAP: 9 (ref 5–15)
AST: 14 U/L — ABNORMAL LOW (ref 15–41)
Alkaline Phosphatase: 69 U/L (ref 38–126)
BUN: 27 mg/dL — ABNORMAL HIGH (ref 6–20)
CHLORIDE: 106 mmol/L (ref 101–111)
CO2: 25 mmol/L (ref 22–32)
Calcium: 9.6 mg/dL (ref 8.9–10.3)
Creatinine, Ser: 1 mg/dL (ref 0.61–1.24)
GFR calc non Af Amer: >60 mL/min (ref >60)
GLUCOSE: 188 mg/dL — AB (ref 65–99)
Potassium: 4.2 mmol/L (ref 3.5–5.1)
SODIUM: 140 mmol/L (ref 135–145)
Total Bilirubin: 1.2 mg/dL (ref 0.3–1.2)
Total Protein: 7.8 g/dL (ref 6.5–8.1)

## 2017-10-07 LAB — CBC
HCT: 35.3 % — ABNORMAL LOW (ref 39.0–52.0)
HEMOGLOBIN: 12 g/dL — AB (ref 13.0–17.0)
MCH: 26.8 pg (ref 26.0–34.0)
MCHC: 34 g/dL (ref 30.0–36.0)
MCV: 79 fL (ref 78.0–100.0)
Platelets: 218 10*3/uL (ref 150–400)
RBC: 4.47 MIL/uL (ref 4.22–5.81)
RDW: 13.5 % (ref 11.5–15.5)
WBC: 6.1 10*3/uL (ref 4.0–10.5)

## 2017-10-07 LAB — CBG MONITORING, ED: GLUCOSE-CAPILLARY: 183 mg/dL — AB (ref 65–99)

## 2017-10-07 LAB — LIPASE, BLOOD: LIPASE: 24 U/L (ref 11–51)

## 2017-10-07 MED ORDER — PROMETHAZINE HCL 25 MG PO TABS: 25.0000 mg | ORAL_TABLET | Freq: Four times a day (QID) | ORAL | 0 refills | Status: DC | PRN

## 2017-10-07 MED ORDER — HALOPERIDOL LACTATE 5 MG/ML IJ SOLN
2.0000 mg | Freq: Once | INTRAMUSCULAR | Status: AC
  Administered 2017-10-07: 2 mg via INTRAVENOUS
  Filled 2017-10-07: qty 1

## 2017-10-07 MED ORDER — ONDANSETRON HCL 4 MG/2ML IJ SOLN
4.0000 mg | Freq: Once | INTRAMUSCULAR | Status: AC
  Administered 2017-10-07: 4 mg via INTRAVENOUS
  Filled 2017-10-07: qty 2

## 2017-10-07 MED ORDER — METOCLOPRAMIDE HCL 10 MG PO TABS: 10.0000 mg | ORAL_TABLET | Freq: Four times a day (QID) | ORAL | 0 refills | Status: DC | PRN

## 2017-10-07 MED ORDER — MORPHINE SULFATE (PF) 4 MG/ML IV SOLN
4.0000 mg | Freq: Once | INTRAVENOUS | Status: AC
  Administered 2017-10-07: 4 mg via INTRAVENOUS
  Filled 2017-10-07: qty 1

## 2017-10-07 MED ORDER — SODIUM CHLORIDE 0.9 % IV BOLUS
1000.0000 mL | Freq: Once | INTRAVENOUS | Status: AC
  Administered 2017-10-07: 1000 mL via INTRAVENOUS

## 2017-10-07 NOTE — ED Notes (Signed)
Patient given discharge instructions and verbalized understanding.  Patient stable to discharge at this time.  Patient is alert and oriented to baseline.  No distressed noted at this time.  All belongings taken with the patient at discharge.   

## 2017-10-07 NOTE — Discharge Instructions (Addendum)
Your blood work was reassuring today.  I have written you a prescription for Reglan and Phenergan should you needed at home.  If you take Reglan, take it 30 minutes before meals.  Please go to your schedule appointment with Dr. Duanne Guess for follow-up of your ER visit today.  Return to the emergency department if you have any new or concerning symptoms like vomiting that is not controlled by medication, fever greater than 100.4 F, abdominal pain that is new or worse than what you have had in the past.

## 2017-10-07 NOTE — ED Triage Notes (Signed)
Pt reports generalized aches and pains as well as vomiting since last Wednesday. Pt has hx of type 1 diabetes. Pt reports he was feeling better over the weekend but has progressively gotten worse again.

## 2017-10-07 NOTE — ED Provider Notes (Signed)
[facility] EMERGENCY DEPARTMENT Provider Note   CSN: [id number] Arrival date & time: 10/07/17  9562     History   Chief Complaint Chief Complaint  Patient presents with  . Emesis    HPI Christopher Callahan is a 27 y.o. male.  HPI  Christopher Callahan is a 27 year old male with a history of type 1 diabetes, diabetic gastroparesis, hypertension who resents the emergency department for evaluation of generalized abdominal pain and nausea/vomiting.  Patient reports that his symptoms began about a week ago.  States that he has 10/10 severity generalized abdominal pain which is constant and feels sharp in nature.  He also reports having about 3 episodes of nonbloody emesis daily.  States that he has had these symptoms in the past, with flareups of his gastroparesis and this feels similar to prior exacerbations.  Reports that he normally has to have IV fluids, pain management and antiemetics before feeling better.  Is also reporting that he has generalized body aches in his arms and legs.  He had a below the knee amputation of his right leg March, 2019 after diagnosed with osteomyelitis in the right foot. He denies fevers, chills, dysuria, urinary frequency, flank pain, diarrhea, melena, hematochezia, chest pain, shortness of breath.  Reports that he has had a cholecystectomy in the past, denies any other previous abdominal surgeries.  Had a bowel movement 2 days ago which was normal for him.  Past Medical History:  Diagnosis Date  . Diabetes mellitus without complication [facility])     Patient Active Problem List   Diagnosis Date Noted  . Hyponatremia   . Unilateral complete BKA, right, sequela ([place])   . Poorly controlled type 2 diabetes mellitus with peripheral neuropathy ([place])   . Hyperkalemia   . AKI (acute kidney injury) ([place])   . S/P below knee amputation, right ([place]) 09/03/2017  . Drug induced constipation   . Tobacco abuse   . Benign essential HTN   . Type 2 diabetes  mellitus with peripheral neuropathy (HCC)   . Acute blood loss anemia   . Post-operative pain   . Subacute osteomyelitis, right ankle and foot ([place])   . Diabetic foot infection ([place])   . Sepsis ([place])   . Severe sepsis ([place]) 08/24/2017  . Essential hypertension 09/04/2016  . Poorly controlled type 1 diabetes mellitus with complication ([place]) 13/01/6577  . Diabetic gastroparesis associated with type 1 diabetes mellitus ([place]) 09/03/2016    Past Surgical History:  Procedure Laterality Date  . AMPUTATION Right 08/27/2017   Procedure: RIGHT BELOW KNEE AMPUTATION;  Surgeon: Newt Minion, MD;  Location: [facility];  Service: Orthopedics;  Laterality: Right;  . CHOLECYSTECTOMY    . SKIN GRAFT          Home Medications    Prior to Admission medications   Medication Sig Start Date End Date Taking? Authorizing Provider  amLODipine (NORVASC) 5 MG tablet Take 1 tablet (5 mg total) by mouth daily. 09/11/17   Angiulli, Lavon Paganini, PA-C  bisacodyl (DULCOLAX) 5 MG EC tablet Take 1 tablet (5 mg total) by mouth daily. 09/11/17   Angiulli, Lavon Paganini, PA-C  blood glucose meter kit and supplies Dispense based on patient and insurance preference. Use up to 3 times daily. (FOR ICD-10 E10.9, E11.9). 09/11/17   Angiulli, Lavon Paganini, PA-C  insulin aspart (NOVOLOG FLEXPEN) 100 UNIT/ML FlexPen Inject 10 Units into the skin 3 (three) times daily with meals. 09/11/17   Angiulli, Lavon Paganini, PA-C  Insulin Detemir (LEVEMIR  FLEXTOUCH) 100 UNIT/ML Pen Inject 24 Units into the skin 2 (two) times daily. 09/11/17   Angiulli, Lavon Paganini, PA-C  methocarbamol (ROBAXIN) 500 MG tablet Take 1 tablet (500 mg total) by mouth every 6 (six) hours as needed for muscle spasms. 09/11/17   Angiulli, Lavon Paganini, PA-C  oxyCODONE (OXY IR/ROXICODONE) 5 MG immediate release tablet Take 1-2 tablets (5-10 mg total) by mouth every 4 (four) hours as needed for moderate pain (pain score 4-6). 09/11/17   Angiulli, Lavon Paganini, PA-C  polyethylene glycol (MIRALAX /  GLYCOLAX) packet Take 17 g by mouth daily as needed for mild constipation. 08/29/17   Aline August, MD    Family History Family History  Problem Relation Age of Onset  . COPD Father     Social History Social History   Tobacco Use  . Smoking status: Current Every Day Smoker    Packs/day: 0.30    Types: Cigarettes  . Smokeless tobacco: Never Used  Substance Use Topics  . Alcohol use: No  . Drug use: No     Allergies   Patient has no known allergies.   Review of Systems Review of Systems  Constitutional: Negative for chills and fever.  HENT: Negative for congestion and sore throat.   Eyes: Negative for visual disturbance.  Respiratory: Negative for shortness of breath.   Cardiovascular: Negative for chest pain.  Gastrointestinal: Positive for abdominal pain (generalized), nausea and vomiting. Negative for abdominal distention, blood in stool and diarrhea.  Genitourinary: Negative for dysuria, flank pain, frequency, hematuria and testicular pain.  Musculoskeletal: Negative for back pain and gait problem.  Skin: Negative for wound.  Neurological: Negative for weakness, numbness and headaches.  Psychiatric/Behavioral: Negative for agitation.     Physical Exam Updated Vital Signs BP 134/78   Pulse 94   Temp 98.6 F (37 C) (Oral)   Resp 18   SpO2 98%   Physical Exam  Constitutional: He appears well-developed and well-nourished. No distress.  Sitting at bedside in no apparent distress, nontoxic-appearing.  HENT:  Head: Normocephalic and atraumatic.  Mouth/Throat: Oropharynx is clear and moist. No oropharyngeal exudate.  Mucous membranes moist.  Eyes: Pupils are equal, round, and reactive to light. Conjunctivae are normal. Right eye exhibits no discharge. Left eye exhibits no discharge.  Neck: Normal range of motion. Neck supple.  Cardiovascular: Normal rate, regular rhythm and intact distal pulses. Exam reveals no friction rub.  No murmur heard. Pulmonary/Chest:  Effort normal and breath sounds normal. No stridor. No respiratory distress. He has no wheezes. He has no rales.  Abdominal:  Abdomen soft and non-distended.  Mild diffuse abdominal tenderness.  No guarding or rigidity.  No rebound tenderness.  McBurney's point negative.  No CVA tenderness.  Musculoskeletal:  Right below the knee amputation.  Well-healing scar tissue.  No erythema, warmth or swelling appreciated over the stump.  Neurological: He is alert. Coordination normal.  Skin: Skin is warm and dry. He is not diaphoretic.  Psychiatric: He has a normal mood and affect. His behavior is normal.  Nursing note and vitals reviewed.    ED Treatments / Results  Labs (all labs ordered are listed, but only abnormal results are displayed) Labs Reviewed  COMPREHENSIVE METABOLIC PANEL - Abnormal; Notable for the following components:      Result Value   Glucose, Bld 188 (*)    BUN 27 (*)    AST 14 (*)    ALT 13 (*)    All other components within normal limits  CBC - Abnormal; Notable for the following components:   Hemoglobin 12.0 (*)    HCT 35.3 (*)    All other components within normal limits  URINALYSIS, ROUTINE W REFLEX MICROSCOPIC - Abnormal; Notable for the following components:   Protein, ur 100 (*)    All other components within normal limits  CBG MONITORING, ED - Abnormal; Notable for the following components:   Glucose-Capillary 183 (*)    All other components within normal limits  LIPASE, BLOOD    EKG None  Radiology No results found.  Procedures Procedures (including critical care time)  Medications Ordered in ED Medications  sodium chloride 0.9 % bolus 1,000 mL (0 mLs Intravenous Stopped 10/07/17 1615)  haloperidol lactate (HALDOL) injection 2 mg (2 mg Intravenous Given 10/07/17 1351)  morphine 4 MG/ML injection 4 mg (4 mg Intravenous Given 10/07/17 1351)  ondansetron (ZOFRAN) injection 4 mg (4 mg Intravenous Given 10/07/17 1351)     Initial Impression / Assessment  and Plan / ED Course  I have reviewed the triage vital signs and the nursing notes.  Pertinent labs & imaging results that were available during my care of the patient were reviewed by me and considered in my medical decision making (see chart for details).    Patient presents to the emergency department for evaluation of nausea/vomiting and generalized abdominal pain.  He reports this is similar to prior exacerbations of his gastroparesis.  Denies fever/chills, diarrhea. On exam he is afebrile and nontoxic-appearing.  He has diffuse abdominal tenderness in all 4 quadrants and over the umbilicus.  No guarding or rigidity.  Lab work reviewed.  CBC unremarkable, no leukocytosis.  CMP without any major electrolyte abnormalities.  His glucose is mildly elevated at 188, patient is a known diabetic.  No anion gap or concern for DKA.  Kidney function normal, liver enzymes within normal.  UA without evidence of infection.  Lipase negative.  Given reassuring lab work and report of this being similar to prior flares of gastroparesis, plan to treat patient with IV fluids, Haldol and pain management.  Doubt acute appendicitis, diverticulitis, bowel obstruction, perforated viscus, nephrolithiasis or deep space infection given presentation, exam and lab work.  On recheck patient reports his pain is much improved.  He is able to tolerate p.o. fluids at the bedside.  Repeat abdominal exam soft and nontender to palpation.  Plan to discharge patient with Reglan and Phenergan for symptommatic control at home.  Counseled him on return precautions and he agrees and voiced understanding to the above plan and appears reliable for follow-up.  Discussed this patient with Dr. Sherry Ruffing who agrees with plan and discharge home.  Final Clinical Impressions(s) / ED Diagnoses   Final diagnoses:  Diabetic gastroparesis associated with type 1 diabetes mellitus [facility])    ED Discharge Orders    None       Bernarda Caffey 10/07/17 2228    Tegeler, Gwenyth Allegra, MD 10/07/17 2350

## 2017-10-23 ENCOUNTER — Encounter (INDEPENDENT_AMBULATORY_CARE_PROVIDER_SITE_OTHER): Payer: Self-pay | Admitting: Orthopedic Surgery

## 2017-10-23 ENCOUNTER — Ambulatory Visit (INDEPENDENT_AMBULATORY_CARE_PROVIDER_SITE_OTHER): Payer: Federal, State, Local not specified - PPO | Admitting: Orthopedic Surgery

## 2017-10-23 DIAGNOSIS — Z89511 Acquired absence of right leg below knee: Secondary | ICD-10-CM

## 2017-10-23 NOTE — Progress Notes (Signed)
Office Visit Note   [name]           Date of Birth: Dec 01, 1990           MRN: [id number] Visit Date: 10/23/2017              Requested by: Jacquelynn Cree, PA-C [address] [place], [place] [address] PCP: Lewis Moccasin, MD  Chief Complaint  Patient presents with  . 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  Assessment & Plan: Visit Diagnoses:  1. Acquired absence of right leg below knee [facility])     Plan: Examination patient has excellent consolidation of the residual wound he will Tuesday start working with Zella Ball with gait training follow-up in the office in 2 months  Follow-Up Instructions: Return in about 2 months (around 12/23/2017).   Ortho Exam  Patient is alert, oriented, no adenopathy, well-dressed, normal affect, normal respiratory effort. Examination the patient has full range of motion his knee he has good consolidation there is no redness no cellulitis no signs of infection.  Imaging: No results found. No images are attached to the encounter.  Labs: Lab Results  Component Value Date   HGBA1C 11.1 (H) 08/25/2017   ESRSEDRATE 107 (H) 08/24/2017   CRP 27.6 (H) 08/27/2017   CRP 29.1 (H) 08/24/2017   REPTSTATUS 08/25/2017 FINAL 08/24/2017   CULT  08/24/2017    NO GROWTH Performed at [facility], [address] [place], [place] [address]      Lab Results  Component Value Date   ALBUMIN 4.2 10/07/2017   ALBUMIN 2.5 (L) 09/04/2017   ALBUMIN 1.9 (L) 08/25/2017    There is no height or weight on file to calculate BMI.  Orders:  No orders of the defined types were placed in this encounter.  No orders of the defined types were placed in this encounter.    Procedures: No procedures performed  Clinical Data: No additional findings.  ROS:  All other systems negative, except as noted  in the HPI. Review of Systems  Objective: Vital Signs: There were no vitals taken for this visit.  Specialty Comments:  No specialty comments available.  PMFS History: Patient Active Problem List   Diagnosis Date Noted  . Hyponatremia   . Unilateral complete BKA, right, sequela (HCC)   . Poorly controlled type 2 diabetes mellitus with peripheral neuropathy (HCC)   . Hyperkalemia   . AKI (acute kidney injury) (HCC)   . S/P below knee amputation, right (HCC) 09/03/2017  . Drug induced constipation   . Tobacco abuse   . Benign essential HTN   . Type 2 diabetes mellitus with peripheral neuropathy (HCC)   . Acute blood loss anemia   . Post-operative pain   . Subacute osteomyelitis, right ankle and foot (HCC)   . Diabetic foot infection (HCC)   . Sepsis (HCC)   . Severe sepsis (HCC) 08/24/2017  . Essential hypertension 09/04/2016  . Poorly controlled type 1 diabetes mellitus with complication (HCC) 09/03/2016  . Diabetic gastroparesis associated with type 1 diabetes mellitus (HCC) 09/03/2016   Past Medical History:  Diagnosis Date  . Diabetes mellitus without complication (HCC)     Family History  Problem Relation Age of Onset  . COPD Father     Past Surgical History:  Procedure Laterality Date  . AMPUTATION  Right 08/27/2017   Procedure: RIGHT BELOW KNEE AMPUTATION;  Surgeon: Nadara Mustard, MD;  Location: [facility] OR;  Service: Orthopedics;  Laterality: Right;  . CHOLECYSTECTOMY    . SKIN GRAFT     Social History   Occupational History  . Not on file  Tobacco Use  . Smoking status: Current Every Day Smoker    Packs/day: 0.30    Types: Cigarettes  . Smokeless tobacco: Never Used  Substance and Sexual Activity  . Alcohol use: No  . Drug use: No  . Sexual activity: Not on file

## 2017-11-07 ENCOUNTER — Other Ambulatory Visit (INDEPENDENT_AMBULATORY_CARE_PROVIDER_SITE_OTHER): Payer: Self-pay

## 2017-11-07 DIAGNOSIS — Z89511 Acquired absence of right leg below knee: Secondary | ICD-10-CM

## 2017-11-24 ENCOUNTER — Encounter: Payer: Self-pay | Admitting: Physical Therapy

## 2017-11-24 ENCOUNTER — Other Ambulatory Visit: Payer: Self-pay

## 2017-11-24 ENCOUNTER — Ambulatory Visit: Payer: Federal, State, Local not specified - PPO | Attending: Orthopedic Surgery | Admitting: Physical Therapy

## 2017-11-24 DIAGNOSIS — R2689 Other abnormalities of gait and mobility: Secondary | ICD-10-CM | POA: Insufficient documentation

## 2017-11-24 DIAGNOSIS — R293 Abnormal posture: Secondary | ICD-10-CM | POA: Insufficient documentation

## 2017-11-24 DIAGNOSIS — M6281 Muscle weakness (generalized): Secondary | ICD-10-CM | POA: Diagnosis present

## 2017-11-24 DIAGNOSIS — R2681 Unsteadiness on feet: Secondary | ICD-10-CM | POA: Insufficient documentation

## 2017-11-24 NOTE — Therapy (Signed)
[facility] [address] [place], [place], [address] Phone: [phone]   Fax:  [phone]  Physical Therapy Evaluation  Patient Details  Name: Christopher Callahan MRN: [id number] Date of Birth: [birth year] Referring Provider: Meridee Score, MD   Encounter Date: 11/24/2017  PT End of Session - 11/24/17 0837    Visit Number  1    Number of Visits  18    Date for PT Re-Evaluation  01/23/18    Authorization Type  [employer]  [id number], $4225.72 Met,  visit limit combined PT/OT/ST 50 with 0 used prior to eval.     PT Start Time  0750    PT Stop Time  0840    PT Time Calculation (min)  50 min    Equipment Utilized During Treatment  Gait belt    Activity Tolerance  Patient tolerated treatment well    Behavior During Therapy  [employer] for tasks assessed/performed       Past Medical History:  Diagnosis Date  . Diabetes mellitus without complication [facility])     Past Surgical History:  Procedure Laterality Date  . AMPUTATION Right 08/27/2017   Procedure: RIGHT BELOW KNEE AMPUTATION;  Surgeon: Newt Minion, MD;  Location: [facility];  Service: Orthopedics;  Laterality: Right;  . CHOLECYSTECTOMY    . SKIN GRAFT      There were no vitals filed for this visit.   Subjective Assessment - 11/24/17 0755    Subjective  This 27yo male was referred on 11/07/2017 by Meridee Score, MD for right Transtibial Amputation. He underwent a right Transtibial Amputation on 08/27/2017 due to infected diabetic wound. He was hospitalized 08/24/2017 transfered to inpatient rehab 09/03/2017-09/11/2017. He recieved his prosthesis 11/18/2017.  He arrived for Physical Therapy Evaluation wearing prosthesis ambulating with rolling walker with partial weight on prosthesis.     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  No/denies         [facility] PT Assessment - 11/24/17 0750      Assessment   Medical Diagnosis  Right Transtibial Amputation    Referring Provider  Meridee Score, MD    Onset Date/Surgical Date  11/18/17    Hand Dominance  Right    Prior Therapy  Inpatient rehab & HHPT      Precautions   Precautions  Fall      Restrictions   Weight Bearing Restrictions  No      Balance Screen   Has the patient fallen in the past 6 months  No    Has the patient had a decrease in activity level because of a fear of falling?   Yes    Is the patient reluctant to leave their home because of a fear of falling?   No      Home Film/video editor residence    Freight forwarder;Other (Comment) [place] mother & [place] roommate    Type of [place] apt & [place] to enter    Entrance Stairs-Number of Steps  15 15 Mom's 2nd floor apt & 2 single steps at 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 20#      Posture/Postural Control   Posture/Postural Control  Postural limitations    Postural Limitations  Rounded Shoulders;Forward head;Weight shift left      ROM / Strength   AROM / PROM / Strength  AROM;Strength      AROM   Overall AROM   Within functional limits for tasks performed      Strength   Overall Strength  Deficits     Overall Strength Comments  gross bilateral hip 4/5 abduction & extension and right knee flexion & extension 4/5      Transfers   Transfers  Sit to Stand;Stand to Sit    Sit to Stand  5: Supervision;With upper extremity assist;With armrests;From chair/3-in-1    Stand to Sit  5: Supervision;With upper extremity assist;With armrests;To chair/3-in-1      Ambulation/Gait   Ambulation/Gait  Yes    Ambulation/Gait Assistance  3: Mod assist;4: Min assist;5: Supervision ModA initially with cane, progressed to MinA; supervision RW    Ambulation/Gait Assistance Details  partial weight on prosthesis    Ambulation Distance (Feet)  100 Feet 100' RW & 100' cane    Assistive device  Rolling walker;Straight cane;Prosthesis    Gait Pattern  Step-through pattern;Decreased arm swing - right;Decreased step length - left;Decreased stance time - right;Decreased stride length;Decreased hip/knee flexion - right;Decreased weight shift to right;Right hip hike;Antalgic;Right flexed knee in stance;Lateral hip instability;Lateral trunk lean to right;Abducted- right    Ambulation Surface  Indoor;Level    Gait velocity  1.15 ft/sec cane & 1.72 ft/sec RW    Stairs  Yes    Stairs Assistance  4: Min guard    Stair Management Technique  Two rails;Step to pattern;Forwards    Number of Stairs  4      Standardized Balance Assessment   Standardized Balance Assessment  Berg Balance Test;Timed Up and Go Test;Dynamic Gait Index      Berg Balance Test   Sit to Stand  Needs minimal aid to stand or to stabilize    Standing Unsupported  Able to stand 2 minutes with supervision    Sitting with Back Unsupported but Feet Supported on Floor or Stool  Able to sit safely and securely 2 minutes    Stand to Sit  Controls descent by using hands    Transfers  Able to transfer safely, definite need of hands    Standing Unsupported with Eyes Closed  Able to stand 10 seconds with supervision    Standing Ubsupported with Feet Together  Able  to place feet together independently but unable to hold for 30 seconds    From Standing, Reach Forward with Outstretched Arm  Can reach forward >5 cm safely (2")    From Standing Position, Pick up Object from Floor  Able to pick up shoe, needs supervision    From Standing Position, Turn to Look Behind Over each Shoulder  Turn sideways only but maintains balance    Turn 360 Degrees  Needs assistance while turning  Standing Unsupported, Alternately Place Feet on Step/Stool  Needs assistance to keep from falling or unable to try    Standing Unsupported, One Foot in Front  Needs help to step but can hold 15 seconds    Standing on One Leg  Tries to lift leg/unable to hold 3 seconds but remains standing independently    Total Score  28    Berg comment:  HIgh fall risk & dependency in standing ADLs      Dynamic Gait Index   Level Surface  Moderate Impairment    Change in Gait Speed  Severe Impairment    Gait with Horizontal Head Turns  Moderate Impairment    Gait with Vertical Head Turns  Moderate Impairment    Gait and Pivot Turn  Moderate Impairment    Step Over Obstacle  Severe Impairment    Step Around Obstacles  Moderate Impairment    Steps  Moderate Impairment    Total Score  6    DGI comment:  High fall risk      Timed Up and Go Test   Normal TUG (seconds)  29.83 High fall risk      Prosthetics Assessment - 11/24/17 0750      Prosthetics   Prosthetic Care Dependent with  Skin check;Residual limb care;Care of non-amputated limb;Prosthetic cleaning;Ply sock cleaning;Correct ply sock adjustment;Proper wear schedule/adjustment;Proper weight-bearing schedule/adjustment    Donning prosthesis   Supervision    Doffing prosthesis   Supervision    Current prosthetic wear tolerance (days/week)   6 of 6 days since delivery    Current prosthetic wear tolerance (#hours/day)   1 hr 1-3 x/day    Current prosthetic weight-bearing tolerance (hours/day)   Patient tolerating standing & gait for 5  minutes without c/o pain or limb discomfort    Edema  pitting 5 sec refill    Residual limb condition   2 openings 35m at distal tibia with clear serous drainage,  normal color, temperature and moisture               Objective measurements completed on examination: See above findings.      [facility] 0750      Prosthetics   Prosthetic Care Comments   PT covered wounds with Tegaderm while instructing in how to donne, use with prosthesis and wear 2hrs 2x/day    Education Provided  Skin check;Residual limb care;Prosthetic cleaning;Proper Donning;Correct ply sock adjustment;Proper wear schedule/adjustment;Other (comment) see prosthetic care comments    Person(s) Educated  Patient    Education Method  Explanation;Demonstration;Tactile cues;Verbal cues    Education Method  Verbalized understanding               PT Short Term Goals - 11/24/17 2059      PT SHORT TERM GOAL #1   Title  Patient verbalizes proper cleaning & skin care with prosthesis use. (All STGs Target Date 12/23/2017)    Time  1    Period  Months    Status  New    Target Date  12/23/17      PT SHORT TERM GOAL #2   Title  Patient tolerates prosthesis wear >8 hrs /day without skin issues.     Time  1    Period  Months    Status  New    Target Date  12/23/17      PT SHORT TERM GOAL #3   Title  Patient ambulates 300' with cane & prosthesis with supervision.  Time  1    Period  Months    Status  New    Target Date  12/23/17      PT SHORT TERM GOAL #4   Title  Patient negotiates ramps, curbs & stairs 1 rail with cane & prosthesis with supervision.     Time  1    Period  Months    Status  New    Target Date  12/23/17      PT SHORT TERM GOAL #5   Title  Patient reaches 10" and picks up objects from floor with prosthesis no UE support with supervision.     Time  1    Period  Months    Status  New    Target Date  12/23/17        PT Long Term Goals -  11/24/17 2049      PT LONG TERM GOAL #1   Title  Patient verbalizes & demonstrates understanding of proper prosthetic care to enable safe use of prosthesis. (All LTGs Target Date: 01/23/2018)    Time  2    Period  Months    Status  New    Target Date  01/23/18      PT LONG TERM GOAL #2   Title  Patient tolerates prosthesis wear >90% of awake hours without skin or limb pain issues to enable function during his day.     Time  2    Period  Months    Status  New    Target Date  01/23/18      PT LONG TERM GOAL #3   Title  Patient ambulates 1000' outdoors including grass, ramps, curbs & stairs 1 rail with prosthesis only modified independent.    Time  2    Period  Months    Status  New    Target Date  01/23/18      PT LONG TERM GOAL #4   Title  Berg Balance with prosthesis only >45/56    Time  2    Period  Months    Status  New    Target Date  01/23/18      PT LONG TERM GOAL #5   Title  Timed Up & Go with prosthesis only <15 seconds.     Time  2    Period  Months    Status  New    Target Date  01/23/18      PT LONG TERM GOAL #6   Title  Dynamic Gait Index with prosthesis only >/= 14/24     Time  2    Period  Months    Status  New    Target Date  01/23/18      PT LONG TERM GOAL #7   Title  Gait Velocity with prosthesis only >1.81 ft/sec to indicate lower fall risk.     Time  2    Period  Months    Status  New    Target Date  01/23/18             Plan - 11/24/17 2031    Clinical Impression Statement  This 27yo male with history of type 1 diabetes underwent a right Transtibial Amputation 08/27/2017. He received his first prosthesis 6 days prior to physical therapy evaluation and reports daily wear for 1 hour 1-3 times per day. Limited wear of prosthesis limits function during his day. He is dependent is proper prosthetic care and has 2 superficial wounds (~5m) with  clear serous drainage. Berg Balance 28/56 and Timed Up-Go 29.83sec with cane with minA both indicate  high fall risk & dependency in standing ADLs. Patient arrived to evaluation ambulating with prosthesis & rolling walker with significant gait deviations including limited weight bearing on prosthesis. PT assessed prosthetic gait with cane with minimal assist and increased gait deviations. Dynamic Gait Index with cane & prosthesis 6/24 and gait velocity 1.15 ft/sec with cane/minimal assist & 1.72 ft/sec with rolling walker indicates high fall risk. Patient would benefit skilled physical therapy to improve function & safety with prosthesis.     History and Personal Factors relevant to plan of care:  R TTA, DM1, neuropathy, HTN, tobacco abuse    Clinical Presentation  Evolving    Clinical Presentation due to:  DM1 with wound on residual limb, high fall risk, prosthetic dependency, neuropathy    Clinical Decision Making  Moderate    Rehab Potential  Good    Clinical Impairments Affecting Rehab Potential  living with mother while recovering,     PT Frequency  2x / week    PT Duration  Other (comment) 9 weeks    PT Treatment/Interventions  ADLs/Self Care Home Management;Canalith Repostioning;DME Instruction;Gait training;Stair training;Functional mobility training;Therapeutic activities;Therapeutic exercise;Balance training;Neuromuscular re-education;Patient/family education;Prosthetic Training;Vestibular    PT Next Visit Plan  review prosthetic care, prosthetic gait with cane including ramps, curbs & stairs 1 rail, HEP at sink    Consulted and Agree with Plan of Care  Patient       Patient will benefit from skilled therapeutic intervention in order to improve the following deficits and impairments:  Abnormal gait, Decreased activity tolerance, Decreased balance, Decreased coordination, Decreased endurance, Decreased knowledge of use of DME, Decreased mobility, Decreased range of motion, Decreased skin integrity, Decreased strength, Dizziness, Increased edema, Impaired flexibility, Postural dysfunction,  Prosthetic Dependency  Visit Diagnosis: Muscle weakness (generalized)  Unsteadiness on feet  Other abnormalities of gait and mobility  Abnormal posture     Problem List Patient Active Problem List   Diagnosis Date Noted  . Hyponatremia   . Unilateral complete BKA, right, sequela ([place])   . Poorly controlled type 2 diabetes mellitus with peripheral neuropathy ([place])   . Hyperkalemia   . AKI (acute kidney injury) ([place])   . S/P below knee amputation, right ([place]) 09/03/2017  . Drug induced constipation   . Tobacco abuse   . Benign essential HTN   . Type 2 diabetes mellitus with peripheral neuropathy (HCC)   . Acute blood loss anemia   . Post-operative pain   . Subacute osteomyelitis, right ankle and foot ([place])   . Diabetic foot infection ([place])   . Sepsis ([place])   . Severe sepsis ([place]) 08/24/2017  . Essential hypertension 09/04/2016  . Poorly controlled type 1 diabetes mellitus with complication ([place]) 35/68/6168  . Diabetic gastroparesis associated with type 1 diabetes mellitus ([place]) 09/03/2016    Okley Magnussen  PT, DPT 11/24/2017, 9:05 PM  [facility] [address] [place], [place], [address] Phone: [phone]   Fax:  [phone]  Name: Jovonni Borquez MRN: [id number] Date of Birth: 11/11/1990

## 2017-11-25 ENCOUNTER — Ambulatory Visit: Payer: Federal, State, Local not specified - PPO | Admitting: Physical Therapy

## 2017-11-27 ENCOUNTER — Encounter
Payer: Federal, State, Local not specified - PPO | Attending: Physical Medicine & Rehabilitation | Admitting: Physical Medicine & Rehabilitation

## 2017-11-27 DIAGNOSIS — E1142 Type 2 diabetes mellitus with diabetic polyneuropathy: Secondary | ICD-10-CM | POA: Insufficient documentation

## 2017-11-27 DIAGNOSIS — F17210 Nicotine dependence, cigarettes, uncomplicated: Secondary | ICD-10-CM | POA: Insufficient documentation

## 2017-11-27 DIAGNOSIS — D62 Acute posthemorrhagic anemia: Secondary | ICD-10-CM | POA: Insufficient documentation

## 2017-11-27 DIAGNOSIS — Z89511 Acquired absence of right leg below knee: Secondary | ICD-10-CM | POA: Insufficient documentation

## 2017-11-27 DIAGNOSIS — R269 Unspecified abnormalities of gait and mobility: Secondary | ICD-10-CM | POA: Insufficient documentation

## 2017-11-27 DIAGNOSIS — I10 Essential (primary) hypertension: Secondary | ICD-10-CM | POA: Insufficient documentation

## 2017-11-27 DIAGNOSIS — Z9049 Acquired absence of other specified parts of digestive tract: Secondary | ICD-10-CM | POA: Insufficient documentation

## 2017-11-28 ENCOUNTER — Encounter: Payer: Self-pay | Admitting: Physical Therapy

## 2017-11-28 ENCOUNTER — Ambulatory Visit: Payer: Federal, State, Local not specified - PPO | Admitting: Physical Therapy

## 2017-11-28 DIAGNOSIS — R2681 Unsteadiness on feet: Secondary | ICD-10-CM

## 2017-11-28 DIAGNOSIS — M6281 Muscle weakness (generalized): Secondary | ICD-10-CM | POA: Diagnosis not present

## 2017-11-28 DIAGNOSIS — R293 Abnormal posture: Secondary | ICD-10-CM

## 2017-11-28 DIAGNOSIS — R2689 Other abnormalities of gait and mobility: Secondary | ICD-10-CM

## 2017-11-28 NOTE — Therapy (Signed)
[facility] [address] [place], [place], [address] Phone: [phone]   Fax:  [phone]  Physical Therapy Treatment  Patient Details  Name: Christopher Callahan MRN: [id number] Date of Birth: Sep 08, 1990 Referring Provider: Meridee Score, MD   Encounter Date: 11/28/2017  PT End of Session - 11/28/17 1035    Visit Number  2    Number of Visits  18    Date for PT Re-Evaluation  01/23/18    Authorization Type  [employer]  [id number], $4225.72 Met,  visit limit combined PT/OT/ST 50 with 0 used prior to eval.     PT Start Time  0930    PT Stop Time  1015    PT Time Calculation (min)  45 min    Equipment Utilized During Treatment  Gait belt    Activity Tolerance  Patient tolerated treatment well    Behavior During Therapy  WFL for tasks assessed/performed       Past Medical History:  Diagnosis Date  . Diabetes mellitus without complication [facility])     Past Surgical History:  Procedure Laterality Date  . AMPUTATION Right 08/27/2017   Procedure: RIGHT BELOW KNEE AMPUTATION;  Surgeon: Newt Minion, MD;  Location: [facility];  Service: Orthopedics;  Laterality: Right;  . CHOLECYSTECTOMY    . SKIN GRAFT      There were no vitals filed for this visit.  Subjective Assessment - 11/28/17 0933    Subjective  No reported changes since last vist; no falls, pt came to session with tegaderm bandage on R reisdual limb wound. pt reported he is wearing prosthesis 2hr/ 2x per day.    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  No/denies                       [facility] Adult PT Treatment/Exercise - 11/28/17 1028      Ambulation/Gait   Ambulation/Gait  Yes    Ambulation/Gait Assistance  4: Min guard;4: Min assist.  Increased assistance on outdoor surfaces due to mild unsteadiness.    Ambulation Distance (Feet)  500 Feet    Assistive device  L Axillary Crutch    Gait Pattern  Decreased stride length;Decreased stance time - right    Ambulation Surface  Unlevel;Outdoor;Paved;Gravel;Grass    Stairs  Yes    Stairs Assistance  5: Supervision;4: Min guard    Stair Management Technique  With crutches;Step to pattern;Forwards L Axillary crutch    Number of Stairs  4    Ramp  4: Min assist    Ramp Details (indicate cue type and reason)  Required verbal cues for proper technique with crutch/prosthesis     Neuro Re-ed    Neuro Re-ed Details   Pt instructed in sink HEP to increase weight bearing on prosthesis, for balance/proprioception; refer to pt instuctions.      Prosthetics   Prosthetic Care Comments   Pt stated he applied Tegaderm to wound before tx. Wound appears to be healing with intact Tegaderm in place.    Current prosthetic wear tolerance (days/week)   everyday    Current prosthetic wear tolerance (#hours/day)   2 hrs 2 x/day    Current prosthetic weight-bearing tolerance (hours/day)   Patient tolerated standing & gait well  today with few rest breaks    Residual limb condition   2 openings 31m at distal tibia, appear to be healing with no drainage present today.    Education Provided  Skin check;Residual limb care;Care of non-amputated limb;Prosthetic cleaning;Ply sock cleaning;Proper Donning    Person(s) Educated  Patient    Education Method  Explanation;Demonstration    Education Method  Verbalized understanding;Returned demonstration         PT Education - 11/28/17 1032    Education Details  Prosthetic care; limb care; after sweating keep limb clean and dry using a cloth to pat dry.     Person(s) Educated  Patient    Methods  Explanation;Demonstration    Comprehension  Verbalized understanding;Returned demonstration       PT Short Term Goals - 11/24/17 2059      PT SHORT TERM GOAL #1    Title  Patient verbalizes proper cleaning & skin care with prosthesis use. (All STGs Target Date 12/23/2017)    Time  1    Period  Months    Status  New    Target Date  12/23/17      PT SHORT TERM GOAL #2   Title  Patient tolerates prosthesis wear >8 hrs /day without skin issues.     Time  1    Period  Months    Status  New    Target Date  12/23/17      PT SHORT TERM GOAL #3   Title  Patient ambulates 300' with cane & prosthesis with supervision.     Time  1    Period  Months    Status  New    Target Date  12/23/17      PT SHORT TERM GOAL #4   Title  Patient negotiates ramps, curbs & stairs 1 rail with cane & prosthesis with supervision.     Time  1    Period  Months    Status  New    Target Date  12/23/17      PT SHORT TERM GOAL #5   Title  Patient reaches 10" and picks up objects from floor with prosthesis no UE support with supervision.     Time  1    Period  Months    Status  New    Target Date  12/23/17        PT Long Term Goals - 11/24/17 2049      PT LONG TERM GOAL #1   Title  Patient verbalizes & demonstrates understanding of proper prosthetic care to enable safe use of prosthesis. (All LTGs Target Date: 01/23/2018)    Time  2    Period  Months    Status  New    Target Date  01/23/18      PT LONG TERM GOAL #2   Title  Patient tolerates prosthesis wear >90% of awake hours without skin or limb pain issues to enable function during his day.     Time  2    Period  Months    Status  New    Target Date  01/23/18      PT LONG TERM GOAL #3   Title  Patient ambulates 1000' outdoors including grass, ramps, curbs & stairs 1 rail with prosthesis only modified independent.    Time  2    Period  Months    Status  New    Target Date  01/23/18      PT LONG TERM  GOAL #4   Title  Oceanographer with prosthesis only >45/56    Time  2    Period  Months    Status  New    Target Date  01/23/18      PT LONG TERM GOAL #5   Title  Timed Up & Go with prosthesis only  <15 seconds.     Time  2    Period  Months    Status  New    Target Date  01/23/18      PT LONG TERM GOAL #6   Title  Dynamic Gait Index with prosthesis only >/= 14/24     Time  2    Period  Months    Status  New    Target Date  01/23/18      PT LONG TERM GOAL #7   Title  Gait Velocity with prosthesis only >1.81 ft/sec to indicate lower fall risk.     Time  2    Period  Months    Status  New    Target Date  01/23/18            Plan - 11/28/17 1036    Clinical Impression Statement  Today's skilled session focused on sink HEP, stairs, ramps and outdoor unlevel surfaces. Pt has progressed to using a axillary crutch and has progressed well since last session. Pt is progressing toward goals and should benefit from continued PT to increase functional independence and progress toward unmet goals.    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    Consulted and Agree with Plan of Care  Patient       Patient will benefit from skilled therapeutic intervention in order to improve the following deficits and impairments:  Abnormal gait, Decreased activity tolerance, Decreased balance, Decreased coordination, Decreased endurance, Decreased knowledge of use of DME, Decreased mobility, Decreased range of motion, Decreased skin integrity, Decreased strength, Dizziness, Increased edema, Impaired flexibility, Postural  dysfunction, Prosthetic Dependency  Visit Diagnosis: Muscle weakness (generalized)  Unsteadiness on feet  Other abnormalities of gait and mobility  Abnormal posture     Problem List Patient Active Problem List   Diagnosis Date Noted  . Hyponatremia   . Unilateral complete BKA, right, sequela ([place])   . Poorly controlled type 2 diabetes mellitus with peripheral neuropathy ([place])   . Hyperkalemia   . AKI (acute kidney injury) ([place])   . S/P below knee amputation, right ([facility]) 09/03/2017  . Drug induced constipation   . Tobacco abuse   . Benign essential HTN   . Type 2 diabetes mellitus with peripheral neuropathy ([facility])   . Acute blood loss anemia   . Post-operative pain   . Subacute osteomyelitis, right ankle and foot ([place])   . Diabetic foot infection ([place])   . Sepsis ([place])   . Severe sepsis ([place]) 08/24/2017  . Essential hypertension 09/04/2016  . Poorly controlled type 1 diabetes mellitus with complication ([place]) 29/51/8841  . Diabetic gastroparesis associated with type 1 diabetes mellitus ([place]) 09/03/2016  Do each exercise 1-2  times per day Do each exercise 10 repetitions Hold each exercise for 2 seconds to feel your location  AT [place].  USE TAPE ON FLOOR TO MARK THE MIDLINE POSITION. You also should try to feel with your limb pressure in socket.  You are trying to feel with limb what you used to feel with the bottom of your foot.  1. Side to Side Shift: Moving your hips only (not shoulders): move weight onto your left leg, HOLD/FEEL.  Move back to equal weight on each leg, HOLD/FEEL. Move weight onto your right leg, HOLD/FEEL. Move back to equal weight on each leg, HOLD/FEEL. Repeat. 2. Front to Back Shift: Moving your hips only (not shoulders): move your weight forward onto your toes, HOLD/FEEL. Move your weight back to equal Flat Foot on both legs, HOLD/FEEL. Move your weight back onto your  heels, HOLD/FEEL. Move your weight back to equal on both legs, HOLD/FEEL. Repeat. 3. Moving Cones / Cups: With equal weight on each leg: Hold on with one hand the first time, then progress to no hand supports. Move cups from one side of sink to the other. Place cups ~2" out of your reach, progress to 10" beyond reach. 4. Overhead/Upward Reaching: alternated reaching up to top cabinets or ceiling if no cabinets present. Keep equal weight on each leg. Start with one hand support on counter while other hand reaches and progress to no hand support with reaching. 5.   Looking Over Shoulders: With equal weight on each leg: alternate turning to look          over your shoulders with one hand support on counter as needed. Shift weight to             side looking, pull hip then shoulder then head/eyes around to look behind you. Start       with one hand support & progress to no hand support.         [place], [place] 11/28/2017, 11:01 AM  [facility] [address] [place], [place], [address] Phone: [phone]   Fax:  [phone]  Name: Saket Hellstrom MRN: [id number] Date of Birth: 1991/05/10  This note has been reviewed and edited by supervising CI. Details on gait outdoors and prosthetic education/wound on limb.  Willow Ora, PTA, [facility] [address], [place], [place] [address] [phone] 11/30/17, 1:45 PM

## 2017-11-28 NOTE — Patient Instructions (Signed)
Do each exercise 1-2  times per day Do each exercise 10 repetitions Hold each exercise for 2 seconds to feel your location  AT SINK FIND YOUR MIDLINE POSITION AND PLACE FEET EQUAL DISTANCE FROM THE MIDLINE.  USE TAPE ON FLOOR TO MARK THE MIDLINE POSITION. You also should try to feel with your limb pressure in socket.  You are trying to feel with limb what you used to feel with the bottom of your foot.  1. Side to Side Shift: Moving your hips only (not shoulders): move weight onto your left leg, HOLD/FEEL.  Move back to equal weight on each leg, HOLD/FEEL. Move weight onto your right leg, HOLD/FEEL. Move back to equal weight on each leg, HOLD/FEEL. Repeat. 2. Front to Back Shift: Moving your hips only (not shoulders): move your weight forward onto your toes, HOLD/FEEL. Move your weight back to equal Flat Foot on both legs, HOLD/FEEL. Move your weight back onto your heels, HOLD/FEEL. Move your weight back to equal on both legs, HOLD/FEEL. Repeat. 3. Moving Cones / Cups: With equal weight on each leg: Hold on with one hand the first time, then progress to no hand supports. Move cups from one side of sink to the other. Place cups ~2" out of your reach, progress to 10" beyond reach. 4. Overhead/Upward Reaching: alternated reaching up to top cabinets or ceiling if no cabinets present. Keep equal weight on each leg. Start with one hand support on counter while other hand reaches and progress to no hand support with reaching. 5.   Looking Over Shoulders: With equal weight on each leg: alternate turning to look          over your shoulders with one hand support on counter as needed. Shift weight to             side looking, pull hip then shoulder then head/eyes around to look behind you. Start       with one hand support & progress to no hand support.

## 2017-12-03 ENCOUNTER — Encounter: Payer: Self-pay | Admitting: Physical Therapy

## 2017-12-03 ENCOUNTER — Ambulatory Visit: Payer: Federal, State, Local not specified - PPO | Attending: Orthopedic Surgery | Admitting: Physical Therapy

## 2017-12-03 DIAGNOSIS — R293 Abnormal posture: Secondary | ICD-10-CM | POA: Diagnosis present

## 2017-12-03 DIAGNOSIS — M6281 Muscle weakness (generalized): Secondary | ICD-10-CM | POA: Diagnosis present

## 2017-12-03 DIAGNOSIS — R2689 Other abnormalities of gait and mobility: Secondary | ICD-10-CM

## 2017-12-03 DIAGNOSIS — R2681 Unsteadiness on feet: Secondary | ICD-10-CM | POA: Diagnosis present

## 2017-12-03 NOTE — Therapy (Signed)
[facility] [address] [place], [place], [address] Phone: [phone]   Fax:  [phone]  Physical Therapy Treatment  Patient Details  Name: Christopher Callahan MRN: [id number] Date of Birth: [birth year] Referring Provider: Meridee Score, MD   Encounter Date: 12/03/2017  PT End of Session - 12/03/17 1021    Visit Number  3    Number of Visits  18    Date for PT Re-Evaluation  01/23/18    Authorization Type  [employer]  [id number], $4225.72 Met,  visit limit combined PT/OT/ST 50 with 0 used prior to eval.     PT Start Time  0930    PT Stop Time  1015    PT Time Calculation (min)  45 min    Equipment Utilized During Treatment  Gait belt    Activity Tolerance  Patient tolerated treatment well    Behavior During Therapy  WFL for tasks assessed/performed       Past Medical History:  Diagnosis Date  . Diabetes mellitus without complication [facility])     Past Surgical History:  Procedure Laterality Date  . AMPUTATION Right 08/27/2017   Procedure: RIGHT BELOW KNEE AMPUTATION;  Surgeon: Newt Minion, MD;  Location: [facility];  Service: Orthopedics;  Laterality: Right;  . CHOLECYSTECTOMY    . SKIN GRAFT      There were no vitals filed for this visit.  Subjective Assessment - 12/03/17 0930    Subjective  No reported pain or falls, pt came to session with tegaderm bandage on R reisdual limb wound, wound is scabbing over and healing well. Pt stated he put shea butter on limb and was eduacated on proper skin care and sock wear. pt reported he is wearing prosthesis 2hr/ 2x per day; and was told to increase wear to 3hrs/ 2x per day. Friction rub wound on R Knee.    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  No/denies           Prosthetics Assessment - 12/03/17 0001      Prosthetics   Prosthetic Care Dependent with  Skin check;Prosthetic cleaning;Ply sock cleaning;Proper wear schedule/adjustment;Proper weight-bearing schedule/adjustment    Donning prosthesis   Supervision    Doffing prosthesis   Modified independent (Device/Increase time)          [facility] Adult PT Treatment/Exercise - 12/03/17 1038      Transfers   Transfers  Sit to Stand;Stand to Sit    Sit to Stand  5: Supervision;With upper extremity assist;With armrests;From chair/3-in-1    Stand to Sit  5: Supervision;With upper extremity assist;With armrests;To chair/3-in-1      Ambulation/Gait   Ambulation/Gait  Yes    Ambulation/Gait Assistance  4: Min guard;4: Min assist    Ambulation/Gait Assistance Details  supervision for indoor level gait without challenges; min guard to min assist for balance with gait on outdoor surfaces due to mild instability on uneven/complaint surfaces.                   Ambulation Distance (Feet)  350 Feet x1 in/outdoors, plus around gym with activities    Assistive device  L Axillary Crutch    Gait Pattern  Decreased stride length;Decreased stance time - right;Step-through pattern;Decreased step length - left;Decreased weight  shift to right;Narrow base of support    Ambulation Surface  Unlevel;Outdoor;Gravel;Grass;Level;Indoor;Paved    Ramp  4: Min assist    Ramp Details (indicate cue type and Christopher)  Needed min assist with vc's ambulating down ramp on technique for knee stability.      .    Curb  4: Min assist    Curb Details (indicate cue type and Christopher)  for balance with cues for sequencing      High Level Balance   High Level Balance Activities  Side stepping;Backward walking;Turns;Tandem walking;Marching forwards;Marching backwards    High Level Balance Comments  in parallel bars with some UE support to maintain balance. min guard assist. 2 sets each      Neuro Re-ed    Neuro Re-ed Details   Pt  instructed in parallel bars for safety with cone taps with bil LE's to increase weight bearing on prosthesis. 10 reps x 2 sets with intermittent UE support to maintain balance      Prosthetics   Prosthetic Care Comments   Pt stated he applied Tegaderm to wound before treatment, intact still. Pt educated on proper sock ply adjustments as his patella was low with new wound present. increased from 0 ply to 3 ply with today's session with improved fit and comfort reported. Pt also educated on use of baby oil/mineral oil on patella/upper thight to decrease friction of liner to skin. Pt educated on use of anti-persperant on lower limb to assist with sweat management/skin integrety. Pt is to increase his wear time to 3 hours 2x a day as long as both wounds to not get worse.                          Current prosthetic wear tolerance (days/week)   everyday    Current prosthetic wear tolerance (#hours/day)   2 hrs 2 x/day    Residual limb condition   wounds on distal limb appear to be healing with no drainage present today. New wound on patella- friction blister vs pressure wound. closed with no drainage.                           Education Provided  Skin check;Residual limb care;Care of non-amputated limb;Ply sock cleaning;Proper wear schedule/adjustment;Proper weight-bearing schedule/adjustment    Person(s) Educated  Patient    Education Method  Explanation;Demonstration;Verbal cues    Education Method  Verbalized understanding;Returned demonstration;Verbal cues required;Needs further instruction         PT Education - 12/03/17 1020    Education Details  Prosthetic care; proper sock wear and skin care.    Person(s) Educated  Patient    Methods  Explanation;Demonstration    Comprehension  Verbalized understanding;Returned demonstration       PT Short Term Goals - 11/24/17 2059      PT SHORT TERM GOAL #1   Title  Patient verbalizes proper cleaning & skin care with prosthesis use. (All STGs Target  Date 12/23/2017)    Time  1    Period  Months    Status  New    Target Date  12/23/17      PT SHORT TERM GOAL #2   Title  Patient tolerates prosthesis wear >8 hrs /day without skin issues.     Time  1    Period  Months    Status  New    Target Date  12/23/17  PT SHORT TERM GOAL #3   Title  Patient ambulates 300' with cane & prosthesis with supervision.     Time  1    Period  Months    Status  New    Target Date  12/23/17      PT SHORT TERM GOAL #4   Title  Patient negotiates ramps, curbs & stairs 1 rail with cane & prosthesis with supervision.     Time  1    Period  Months    Status  New    Target Date  12/23/17      PT SHORT TERM GOAL #5   Title  Patient reaches 10" and picks up objects from floor with prosthesis no UE support with supervision.     Time  1    Period  Months    Status  New    Target Date  12/23/17        PT Long Term Goals - 11/24/17 2049      PT LONG TERM GOAL #1   Title  Patient verbalizes & demonstrates understanding of proper prosthetic care to enable safe use of prosthesis. (All LTGs Target Date: 01/23/2018)    Time  2    Period  Months    Status  New    Target Date  01/23/18      PT LONG TERM GOAL #2   Title  Patient tolerates prosthesis wear >90% of awake hours without skin or limb pain issues to enable function during his day.     Time  2    Period  Months    Status  New    Target Date  01/23/18      PT LONG TERM GOAL #3   Title  Patient ambulates 1000' outdoors including grass, ramps, curbs & stairs 1 rail with prosthesis only modified independent.    Time  2    Period  Months    Status  New    Target Date  01/23/18      PT LONG TERM GOAL #4   Title  Berg Balance with prosthesis only >45/56    Time  2    Period  Months    Status  New    Target Date  01/23/18      PT LONG TERM GOAL #5   Title  Timed Up & Go with prosthesis only <15 seconds.     Time  2    Period  Months    Status  New    Target Date  01/23/18      PT  LONG TERM GOAL #6   Title  Dynamic Gait Index with prosthesis only >/= 14/24     Time  2    Period  Months    Status  New    Target Date  01/23/18      PT LONG TERM GOAL #7   Title  Gait Velocity with prosthesis only >1.81 ft/sec to indicate lower fall risk.     Time  2    Period  Months    Status  New    Target Date  01/23/18            Plan - 12/03/17 1022    Clinical Impression Statement  Today's skilled session focused on educating patient on prosthetic & skin care, appropriate layers of socks, and exercises that promoted weight bearing on prosthesis. Pt is progressing toawrd goals and would benefit from continued PT to promote functional independence and  meeting goals.    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       Patient will benefit from skilled therapeutic intervention in order to improve the following deficits and impairments:  Abnormal gait, Decreased activity tolerance, Decreased balance, Decreased coordination, Decreased endurance, Decreased knowledge of use of DME, Decreased mobility, Decreased range of motion, Decreased skin integrity, Decreased strength, Dizziness, Increased edema, Impaired flexibility, Postural dysfunction, Prosthetic Dependency  Visit Diagnosis: Muscle weakness (generalized)  Unsteadiness on feet  Other abnormalities of gait and mobility  Abnormal  posture     Problem List Patient Active Problem List   Diagnosis Date Noted  . Hyponatremia   . Unilateral complete BKA, right, sequela ([place])   . Poorly controlled type 2 diabetes mellitus with peripheral neuropathy ([place])   . Hyperkalemia   . AKI (acute kidney injury) ([place])   . S/P below knee amputation, right ([place]) 09/03/2017  . Drug induced constipation   . Tobacco abuse   . Benign essential HTN   . Type 2 diabetes mellitus with peripheral neuropathy ([facility])   . Acute blood loss anemia   . Post-operative pain   . Subacute osteomyelitis, right ankle and foot ([place])   . Diabetic foot infection ([place])   . Sepsis ([place])   . Severe sepsis ([place]) 08/24/2017  . Essential hypertension 09/04/2016  . Poorly controlled type 1 diabetes mellitus with complication ([place]) 75/44/9201  . Diabetic gastroparesis associated with type 1 diabetes mellitus [facility]) 09/03/2016    Halina Andreas, [place] 12/03/2017, 10:52 AM  [facility] [address] [place], [place], [address] Phone: [phone]   Fax:  [phone]  Name: Mordche Hedglin MRN: [id number] Date of Birth: 02-01-1991   This note has been reviewed and edited by supervising CI. Additional details/information added to treatment flowsheet.   Willow Ora, PTA, [facility] [address], [place], [place] [address] [phone] 12/03/17, 6:28 PM

## 2017-12-10 ENCOUNTER — Encounter: Payer: Self-pay | Admitting: Physical Therapy

## 2017-12-10 ENCOUNTER — Ambulatory Visit: Payer: Federal, State, Local not specified - PPO | Admitting: Physical Therapy

## 2017-12-10 DIAGNOSIS — R2681 Unsteadiness on feet: Secondary | ICD-10-CM

## 2017-12-10 DIAGNOSIS — R2689 Other abnormalities of gait and mobility: Secondary | ICD-10-CM

## 2017-12-10 DIAGNOSIS — M6281 Muscle weakness (generalized): Secondary | ICD-10-CM

## 2017-12-10 NOTE — Therapy (Signed)
[facility] [address] [place], [place], [address] Phone: [phone]   Fax:  [phone]  Physical Therapy Treatment  Patient Details  Name: Christopher Callahan MRN: [id number] Date of Birth: 1991-05-08 Referring Provider: Meridee Score, MD   Encounter Date: 12/10/2017  PT End of Session - 12/10/17 1054    Visit Number  4    Number of Visits  18    Date for PT Re-Evaluation  01/23/18    Authorization Type  [employer]  $[id number], $4225.72 Met,  visit limit combined PT/OT/ST 50 with 0 used prior to eval.     PT Start Time  0930    PT Stop Time  1012    PT Time Calculation (min)  42 min    Equipment Utilized During Treatment  Gait belt    Activity Tolerance  Patient tolerated treatment well    Behavior During Therapy  WFL for tasks assessed/performed       Past Medical History:  Diagnosis Date  . Diabetes mellitus without complication [facility])     Past Surgical History:  Procedure Laterality Date  . AMPUTATION Right 08/27/2017   Procedure: RIGHT BELOW KNEE AMPUTATION;  Surgeon: Newt Minion, MD;  Location: [facility];  Service: Orthopedics;  Laterality: Right;  . CHOLECYSTECTOMY    . SKIN GRAFT      There were no vitals filed for this visit.  Subjective Assessment - 12/10/17 0934    Subjective  No reported pain or falls. Pt reported Not using any tegaderm at this time because wound has healed. Reports he is wearing prosthesis 3 hrs/2x day. reports he has tried walking around house without crutch.    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  No/denies       Prosthetics Assessment - 12/10/17 1100      Prosthetics   Prosthetic Care Independent with  Skin check;Residual limb care;Correct ply sock  adjustment;Proper wear schedule/adjustment    Prosthetic Care Comments   Pt has increased sock ply from 3 to 5. pt to increase his wear time to 4 hours 2x a day starting today    Current prosthetic wear tolerance (days/week)   everyday    Current prosthetic wear tolerance (#hours/day)   3hrs/ 2 x day    Residual limb condition   wounds have healed well; intact           [facility] Adult PT Treatment/Exercise - 12/10/17 1100      Transfers   Transfers  Sit to Stand;Stand to Sit    Sit to Stand  5: Supervision    Stand to Sit  5: Supervision      Ambulation/Gait   Ambulation/Gait  Yes    Ambulation/Gait Assistance  4: Min guard;4: Min assist;6: Modified independent (Device/Increase time)    Ambulation/Gait Assistance Details  mod I with crutch; min to min guard without AD on level indoor and unlevel outdoor surface    Ambulation Distance (Feet)  620 Feet x1, + around gym with activities    Assistive device  None    Gait Pattern  Decreased stride length;Decreased stance time - right;Step-through pattern;Decreased step length - left;Decreased weight shift to right;Narrow base of support    Ambulation Surface  Unlevel;Level;Indoor;Outdoor;Paved;Gravel;Grass    Gait Comments  Pt was progressed to no AD during today session. Pt required min guard on indoor level surfaces, needed vc on posture and step length. On outdoor unlevel surface pt required min-min guard to maintain balance and requied vc on step length.        High Level Balance   High Level Balance Activities  Figure 8 turns;Negotiating over obstacles;Side stepping;Head turns    High Level Balance Comments  Pt instructed in proper weight shift while making turns pt required verbal cues and demo to perform activitiy, no UE support. Side stepping while hovering hands near wall for safety, pt needed min guard assist for balance. Walking down hallways with left<>right head turns. When looking to the left pt had decreased step length and slow  gt speed.  pt also istructed in steping over obtsatcles, demo and reminders for correct sequence and placement .      Knee/Hip Exercises: Standing   Lateral Step Up  10 reps;Right;Hand Hold: 1    Forward Step Up  Right;10 reps;Hand Hold: 1 to increase WB on prosthesis; pt required vc on proper form    Other Standing Knee Exercises  Picking up 30#  box off floor and placing onto matt to simulate on the job activities. 10 reps x 1 set. Pt need reminders of proper foot placement           PT Short Term Goals - 11/24/17 2059      PT SHORT TERM GOAL #1   Title  Patient verbalizes proper cleaning & skin care with prosthesis use. (All STGs Target Date 12/23/2017)    Time  1    Period  Months    Status  New    Target Date  12/23/17      PT SHORT TERM GOAL #2   Title  Patient tolerates prosthesis wear >8 hrs /day without skin issues.     Time  1    Period  Months    Status  New    Target Date  12/23/17      PT SHORT TERM GOAL #3   Title  Patient ambulates 300' with cane & prosthesis with supervision.     Time  1    Period  Months    Status  New    Target Date  12/23/17      PT SHORT TERM GOAL #4   Title  Patient negotiates ramps, curbs & stairs 1 rail with cane & prosthesis with supervision.     Time  1    Period  Months    Status  New    Target Date  12/23/17      PT SHORT TERM GOAL #5   Title  Patient reaches 10" and picks up objects from floor with prosthesis no UE support with supervision.     Time  1    Period  Months    Status  New    Target Date  12/23/17        PT Long Term Goals - 11/24/17 2049      PT LONG TERM GOAL #1   Title  Patient verbalizes & demonstrates understanding of proper prosthetic care to enable safe use of prosthesis. (All LTGs Target Date: 01/23/2018)    Time  2    Period  Months    Status  New    Target Date  01/23/18      PT LONG TERM GOAL #2   Title  Patient tolerates prosthesis wear >90% of awake  hours without skin or limb pain issues  to enable function during his day.     Time  2    Period  Months    Status  New    Target Date  01/23/18      PT LONG TERM GOAL #3   Title  Patient ambulates 1000' outdoors including grass, ramps, curbs & stairs 1 rail with prosthesis only modified independent.    Time  2    Period  Months    Status  New    Target Date  01/23/18      PT LONG TERM GOAL #4   Title  Berg Balance with prosthesis only >45/56    Time  2    Period  Months    Status  New    Target Date  01/23/18      PT LONG TERM GOAL #5   Title  Timed Up & Go with prosthesis only <15 seconds.     Time  2    Period  Months    Status  New    Target Date  01/23/18      PT LONG TERM GOAL #6   Title  Dynamic Gait Index with prosthesis only >/= 14/24     Time  2    Period  Months    Status  New    Target Date  01/23/18      PT LONG TERM GOAL #7   Title  Gait Velocity with prosthesis only >1.81 ft/sec to indicate lower fall risk.     Time  2    Period  Months    Status  New    Target Date  01/23/18            Plan - 12/10/17 1054    Clinical Impression Statement  today's skilled session focused on prosthetic gait training including gait without AD on level and unlevel surfaces. Pt's wound on resisdual limb has closed and healed well.  Pt is progressing well and would beneift from continued PT in order to increase functional independence meet unment goals.     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       Patient will benefit from skilled therapeutic intervention in order to improve the following deficits and impairments:  Abnormal gait, Decreased activity tolerance, Decreased balance, Decreased coordination, Decreased endurance, Decreased knowledge of use of DME, Decreased mobility, Decreased range of motion, Decreased skin integrity, Decreased strength, Dizziness, Increased edema, Impaired flexibility, Postural dysfunction, Prosthetic Dependency  Visit Diagnosis: Muscle weakness (generalized)  Unsteadiness on feet  Other abnormalities of gait and mobility     Problem List Patient Active Problem List   Diagnosis Date Noted  . Hyponatremia   . Unilateral complete BKA, right, sequela ([place])   . Poorly controlled type 2 diabetes mellitus with peripheral neuropathy ([place])   . Hyperkalemia   . AKI (acute kidney injury) ([facility])   . S/P below knee amputation, right ([facility]) 09/03/2017  . Drug induced constipation   .  Tobacco abuse   . Benign essential HTN   . Type 2 diabetes mellitus with peripheral neuropathy ([facility])   . Acute blood loss anemia   . Post-operative pain   . Subacute osteomyelitis, right ankle and foot ([facility])   . Diabetic foot infection ([facility])   . Sepsis ([facility])   . Severe sepsis ([place]) 08/24/2017  . Essential hypertension 09/04/2016  . Poorly controlled type 1 diabetes mellitus with complication ([place]) 75/03/2584  . Diabetic gastroparesis associated with type 1 diabetes mellitus ([place]) 09/03/2016   Halina Andreas, [place] 12/10/2017, 3:01 PM  [facility] [address] [place], [place], [address] Phone: [phone]   Fax:  [phone]  Name: Christopher Callahan MRN: [id number] Date of Birth: [birth year]

## 2017-12-12 ENCOUNTER — Ambulatory Visit: Payer: Federal, State, Local not specified - PPO | Admitting: Physical Therapy

## 2017-12-12 ENCOUNTER — Encounter: Payer: Self-pay | Admitting: Physical Therapy

## 2017-12-12 DIAGNOSIS — R2681 Unsteadiness on feet: Secondary | ICD-10-CM

## 2017-12-12 DIAGNOSIS — M6281 Muscle weakness (generalized): Secondary | ICD-10-CM | POA: Diagnosis not present

## 2017-12-12 DIAGNOSIS — R293 Abnormal posture: Secondary | ICD-10-CM

## 2017-12-12 DIAGNOSIS — R2689 Other abnormalities of gait and mobility: Secondary | ICD-10-CM

## 2017-12-12 NOTE — Therapy (Signed)
[facility] [address] [place], [place], [address] Phone: [phone]   Fax:  [phone]  Physical Therapy Treatment  Patient Details  Name: Christopher Callahan MRN: [id number] Date of Birth: 11/15/1990 Referring Provider: Meridee Score, MD   Encounter Date: 12/12/2017  PT End of Session - 12/12/17 0936    Visit Number  5    Number of Visits  18    Date for PT Re-Evaluation  01/23/18    Authorization Type  [employer]  $[id number], $4225.72 Met,  visit limit combined PT/OT/ST 50 with 0 used prior to eval.     PT Start Time  0801    PT Stop Time  0843    PT Time Calculation (min)  42 min    Equipment Utilized During Treatment  Gait belt    Activity Tolerance  Patient tolerated treatment well    Behavior During Therapy  [facility] for tasks assessed/performed       Past Medical History:  Diagnosis Date  . Diabetes mellitus without complication [facility])     Past Surgical History:  Procedure Laterality Date  . AMPUTATION Right 08/27/2017   Procedure: RIGHT BELOW KNEE AMPUTATION;  Surgeon: Newt Minion, MD;  Location: [facility];  Service: Orthopedics;  Laterality: Right;  . CHOLECYSTECTOMY    . SKIN GRAFT      There were no vitals filed for this visit.  Subjective Assessment - 12/12/17 0802    Subjective  No reported pain or falls. Reports he is wearing prosthesis 4 hrs/2x day. Pt amublated into tx using no AD, but brought his forearm crutch with him.    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  No/denies          Prosthetics Assessment - 12/12/17 0001      Prosthetics   Current prosthetic wear tolerance (days/week)   everyday    Current prosthetic wear tolerance (#hours/day)   4hrs/ 2 x day    Residual limb  condition   skin intact per pt report          [facility] Adult PT Treatment/Exercise - 12/12/17 5809      Ambulation/Gait   Ambulation/Gait  Yes    Ambulation/Gait Assistance  4: Min guard;4: Min assist    Ambulation/Gait Assistance Details  Min assist with outdoors unlevel; min guard level surface no AD    Ambulation Distance (Feet)  -- ~700' x1 on sidewalk then grassy uphill sufaces    Assistive device  None;Prosthesis;L Axillary Crutch    Gait Pattern  Decreased stride length;Decreased stance time - right;Step-through pattern;Decreased step length - left;Decreased weight shift to right;Narrow base of support;Decreased arm swing - right    Ambulation Surface  Outdoor;Unlevel;Grass;Paved    Stairs  Yes    Stairs Assistance  4: Min guard no AD    Stair Management Technique  One rail Right;One rail Left;Two rails;Step to pattern;Forwards    Number of Stairs  4    Ramp  4: Min assist No AD    Ramp Details (indicate cue type and reason)  cues on stepping pattern and posture    Curb  4: Min assist No AD    Curb Details (indicate cue type and reason)  verbal cues needed for proper foot sequence  Gait Comments  Pt used no AD on barriers today. Pt needing min assist and cueing for technique. On outdoor unlevel surface pt needing min assist to maintain balance with walking up and down hill using L axillary crutch.       High Level Balance   High Level Balance Activities  Braiding;Backward walking;Head turns;Tandem walking;Figure 8 turns;Negotitating around obstacles;Negotiating over obstacles    High Level Balance Comments  Pt instucted in walking over and around obstcales in a figure 8 pattern min guard assist. Pt had good carry over from last session only needing reminders for proper technique. Pt still has to stop before stepping over object. Pt also instructed in braiding and tandem walking fwd<>bwd  at counter top for saftey. Pt needing UE support and min assist for maintaining balance, vc on  foot placement and posture. Dynamic gait In hallway with head mvts side<>side and up<>down. pt required min assist due to balance when changing head postiton while ambulating.       Neuro Re-ed    Neuro Re-ed Details   Pt insturcted in // bars wih single limb balance on prosthesis while lightly tapping on to 4" step to work on weight shifting x 10., requiring min guard assist and hovering hands on bars to maintain balance. On rocker board pt was instructed with ant<>post and side<>side shifts x 10 reps x 3 set each with EO. Needing min guard assist and intermittent bil UE support for balance. Verbal cues on posture and ws on to prosthesis.                PT Short Term Goals - 11/24/17 2059      PT SHORT TERM GOAL #1   Title  Patient verbalizes proper cleaning & skin care with prosthesis use. (All STGs Target Date 12/23/2017)    Time  1    Period  Months    Status  New    Target Date  12/23/17      PT SHORT TERM GOAL #2   Title  Patient tolerates prosthesis wear >8 hrs /day without skin issues.     Time  1    Period  Months    Status  New    Target Date  12/23/17      PT SHORT TERM GOAL #3   Title  Patient ambulates 300' with cane & prosthesis with supervision.     Time  1    Period  Months    Status  New    Target Date  12/23/17      PT SHORT TERM GOAL #4   Title  Patient negotiates ramps, curbs & stairs 1 rail with cane & prosthesis with supervision.     Time  1    Period  Months    Status  New    Target Date  12/23/17      PT SHORT TERM GOAL #5   Title  Patient reaches 10" and picks up objects from floor with prosthesis no UE support with supervision.     Time  1    Period  Months    Status  New    Target Date  12/23/17        PT Long Term Goals - 11/24/17 2049      PT LONG TERM GOAL #1   Title  Patient verbalizes & demonstrates understanding of proper prosthetic care to enable safe use of prosthesis. (All LTGs Target Date: 01/23/2018)    Time  2  Period   Months    Status  New    Target Date  01/23/18      PT LONG TERM GOAL #2   Title  Patient tolerates prosthesis wear >90% of awake hours without skin or limb pain issues to enable function during his day.     Time  2    Period  Months    Status  New    Target Date  01/23/18      PT LONG TERM GOAL #3   Title  Patient ambulates 1000' outdoors including grass, ramps, curbs & stairs 1 rail with prosthesis only modified independent.    Time  2    Period  Months    Status  New    Target Date  01/23/18      PT LONG TERM GOAL #4   Title  Berg Balance with prosthesis only >45/56    Time  2    Period  Months    Status  New    Target Date  01/23/18      PT LONG TERM GOAL #5   Title  Timed Up & Go with prosthesis only <15 seconds.     Time  2    Period  Months    Status  New    Target Date  01/23/18      PT LONG TERM GOAL #6   Title  Dynamic Gait Index with prosthesis only >/= 14/24     Time  2    Period  Months    Status  New    Target Date  01/23/18      PT LONG TERM GOAL #7   Title  Gait Velocity with prosthesis only >1.81 ft/sec to indicate lower fall risk.     Time  2    Period  Months    Status  New    Target Date  01/23/18            Plan - 12/12/17 7106    Clinical Impression Statement  Today's session continues to focus on prosthetic training without using AD. Pt had good carry over from previous session with barrires. Pt is continuing to progess toward all goals and would benefit from further PT in order to meet goals.     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       Patient will benefit from skilled therapeutic intervention in order to improve the following deficits and impairments:  Abnormal gait, Decreased activity tolerance, Decreased balance, Decreased coordination, Decreased endurance, Decreased knowledge of use of DME, Decreased mobility, Decreased range of motion, Decreased skin integrity, Decreased strength, Dizziness, Increased edema, Impaired flexibility, Postural dysfunction, Prosthetic Dependency  Visit Diagnosis: Muscle weakness (generalized)  Unsteadiness on feet  Other abnormalities of gait and mobility  Abnormal posture     Problem List Patient Active Problem List   Diagnosis Date Noted  . Hyponatremia   . Unilateral complete BKA, right, sequela ([place])   . Poorly controlled type 2 diabetes mellitus with peripheral neuropathy ([facility])   .  Hyperkalemia   . AKI (acute kidney injury) ([facility])   . S/P below knee amputation, right ([facility]) 09/03/2017  . Drug induced constipation   . Tobacco abuse   . Benign essential HTN   . Type 2 diabetes mellitus with peripheral neuropathy ([facility])   . Acute blood loss anemia   . Post-operative pain   . Subacute osteomyelitis, right ankle and foot ([place])   . Diabetic foot infection ([place])   . Sepsis ([facility])   . Severe sepsis ([place]) 08/24/2017  . Essential hypertension 09/04/2016  . Poorly controlled type 1 diabetes mellitus with complication ([place]) 85/90/9311  . Diabetic gastroparesis associated with type 1 diabetes mellitus [facility]) 09/03/2016   Halina Andreas, [place] 12/12/2017, 10:20 AM  [facility] [address] [place], [place], [address] Phone: [phone]   Fax:  [phone]  Name: Christopher Callahan MRN: [id number] Date of Birth:  [birth year]

## 2017-12-16 ENCOUNTER — Ambulatory Visit: Payer: Federal, State, Local not specified - PPO | Admitting: Physical Therapy

## 2017-12-18 ENCOUNTER — Ambulatory Visit: Payer: Federal, State, Local not specified - PPO | Admitting: Physical Therapy

## 2017-12-18 ENCOUNTER — Encounter: Payer: Self-pay | Admitting: Physical Therapy

## 2017-12-18 DIAGNOSIS — M6281 Muscle weakness (generalized): Secondary | ICD-10-CM

## 2017-12-18 DIAGNOSIS — R2681 Unsteadiness on feet: Secondary | ICD-10-CM

## 2017-12-18 DIAGNOSIS — R293 Abnormal posture: Secondary | ICD-10-CM

## 2017-12-18 DIAGNOSIS — R2689 Other abnormalities of gait and mobility: Secondary | ICD-10-CM

## 2017-12-18 NOTE — Therapy (Signed)
[facility] [address] [place], [place], [address] Phone: [phone]   Fax:  [phone]  Physical Therapy Treatment  Patient Details  Name: Christopher Callahan MRN: [id number] Date of Birth: [birth year] Referring Provider: Meridee Score, MD   Encounter Date: 12/18/2017  PT End of Session - 12/18/17 0806    Visit Number  6    Number of Visits  18    Date for PT Re-Evaluation  01/23/18    Authorization Type  [employer]  [id number], $4225.72 Met,  visit limit combined PT/OT/ST 50 with 0 used prior to eval.     PT Start Time  0805    PT Stop Time  0844    PT Time Calculation (min)  39 min    Equipment Utilized During Treatment  Gait belt    Activity Tolerance  Patient tolerated treatment well    Behavior During Therapy  [facility] for tasks assessed/performed       Past Medical History:  Diagnosis Date  . Diabetes mellitus without complication [facility])     Past Surgical History:  Procedure Laterality Date  . AMPUTATION Right 08/27/2017   Procedure: RIGHT BELOW KNEE AMPUTATION;  Surgeon: Newt Minion, MD;  Location: [facility];  Service: Orthopedics;  Laterality: Right;  . CHOLECYSTECTOMY    . SKIN GRAFT      There were no vitals filed for this visit.  Subjective Assessment - 12/18/17 0806    Subjective  No new complaints. No falls or pain to report.     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  No/denies          [facility] Adult PT Treatment/Exercise - 12/18/17 0808      Transfers   Transfers  Sit to Stand;Stand to Sit    Sit to Stand  6: Modified independent (Device/Increase time)    Stand to Sit  6: Modified independent (Device/Increase time)      Ambulation/Gait   Ambulation/Gait  Yes    Ambulation/Gait  Assistance  5: Supervision;4: Min guard    Ambulation/Gait Assistance Details  min guard initially on outdoor surfaces progressing to supervision with one small balance loss fwd that pt self corrected.     Ambulation Distance (Feet)  730 Feet x1, plus around gym    Assistive device  None;Prosthesis    Gait Pattern  Step-through pattern;Decreased stride length;Decreased stance time - right;Decreased step length - left    Ambulation Surface  Level;Unlevel;Indoor;Outdoor;Paved;Gravel;Grass    Stairs  Yes    Stairs Assistance  5: Supervision    Stair Management Technique  One rail Right;Two rails;Alternating pattern;Forwards    Number of Stairs  4 x 5-6 reps    Ramp  5: Supervision    Ramp Details (indicate cue type and reason)  prosthesis only x 3  reps      High Level Balance   High Level Balance Activities  Negotitating around obstacles;Negotiating over obstacles;Braiding;Backward walking    High Level Balance Comments  prosthesis only: obstacle course around hoola hoops and stepping over 3 bolsters of varied heights x 3 laps with min guard assist; next to counter for safety- braiding, then backward walking x 3-4 laps each with min guard assist, cues on  Neuro Re-ed    Neuro Re-ed Details   for balance/coordination: forward gait along ~50 foot hallway with head movements left<>right, up<>down x 4 laps each with min guard assist, veering noted with decreased gait speed with head movements.       Prosthetics   Prosthetic Care Comments   Pt to increase to all awake hours with drying limb/liner prn    Current prosthetic wear tolerance (days/week)   everyday    Current prosthetic wear tolerance (#hours/day)   4hrs/ 2 x day    Residual limb condition   pt report no issues               PT Short Term Goals - 12/19/17 1255      PT SHORT TERM GOAL #1   Title  Patient verbalizes proper cleaning & skin care with prosthesis use. (All STGs Target Date  12/23/2017)    Baseline  12/18/17: met except for with new/unfamiliar concepts    Status  Achieved      PT SHORT TERM GOAL #2   Title  Patient tolerates prosthesis wear >8 hrs /day without skin issues.     Baseline  12/18/17: met today    Status  Achieved      PT SHORT TERM GOAL #3   Title  Patient ambulates 300' with cane & prosthesis with supervision.     Baseline  12/18/17: met today with no device    Status  Achieved      PT SHORT TERM GOAL #4   Title  Patient negotiates ramps, curbs & stairs 1 rail with cane & prosthesis with supervision.     Baseline  12/18/17: met today with no AD, reciprocal pattern on stairs does require 2 rails, step to pattern with single rail    Status  Achieved      PT SHORT TERM GOAL #5   Title  Patient reaches 10" and picks up objects from floor with prosthesis no UE support with supervision.     Time  1    Period  Months    Status  On-going        PT Long Term Goals - 11/24/17 2049      PT LONG TERM GOAL #1   Title  Patient verbalizes & demonstrates understanding of proper prosthetic care to enable safe use of prosthesis. (All LTGs Target Date: 01/23/2018)    Time  2    Period  Months    Status  New    Target Date  01/23/18      PT LONG TERM GOAL #2   Title  Patient tolerates prosthesis wear >90% of awake hours without skin or limb pain issues to enable function during his day.     Time  2    Period  Months    Status  New    Target Date  01/23/18      PT LONG TERM GOAL #3   Title  Patient ambulates 1000' outdoors including grass, ramps, curbs & stairs 1 rail with prosthesis only modified independent.    Time  2    Period  Months    Status  New    Target Date  01/23/18      PT LONG TERM GOAL #4   Title  Berg Balance with prosthesis only >45/56    Time  2    Period  Months    Status  New    Target Date  01/23/18      PT  LONG TERM GOAL #5   Title  Timed Up & Go with prosthesis only <15 seconds.     Time  2    Period  Months     Status  New    Target Date  01/23/18      PT LONG TERM GOAL #6   Title  Dynamic Gait Index with prosthesis only >/= 14/24     Time  2    Period  Months    Status  New    Target Date  01/23/18      PT LONG TERM GOAL #7   Title  Gait Velocity with prosthesis only >1.81 ft/sec to indicate lower fall risk.     Time  2    Period  Months    Status  New    Target Date  01/23/18            Plan - 12/18/17 0807    Clinical Impression Statement  Pt has made significant progress with therapy with use of prosthesis. He is now ambulating full time with no AD with most STGs met. Will plan to assess remaining STG and LTGs at next session for goal upgrade and potential for pt to discharge early from PT due to significant progress.     Rehab Potential  Good    Clinical Impairments Affecting Rehab Potential  living with mother while recovering,     PT Frequency  2x / week    PT Duration  Other (comment)    PT Treatment/Interventions  ADLs/Self Care Home Management;Canalith Repostioning;DME Instruction;Gait training;Stair training;Functional mobility training;Therapeutic activities;Therapeutic exercise;Balance training;Neuromuscular re-education;Patient/family education;Prosthetic Training;Vestibular    PT Next Visit Plan  check remaining STG and LTGs. then discuss progress with primary PT on goal update or potential to discharge early (in next week or so) depending on goal progress.        Patient will benefit from skilled therapeutic intervention in order to improve the following deficits and impairments:  Abnormal gait, Decreased activity tolerance, Decreased balance, Decreased coordination, Decreased endurance, Decreased knowledge of use of DME, Decreased mobility, Decreased range of motion, Decreased skin integrity, Decreased strength, Dizziness, Increased edema, Impaired flexibility, Postural dysfunction, Prosthetic Dependency  Visit Diagnosis: Muscle weakness (generalized)  Unsteadiness  on feet  Other abnormalities of gait and mobility  Abnormal posture     Problem List Patient Active Problem List   Diagnosis Date Noted  . Hyponatremia   . Unilateral complete BKA, right, sequela ([place])   . Poorly controlled type 2 diabetes mellitus with peripheral neuropathy ([facility])   . Hyperkalemia   . AKI (acute kidney injury) ([place])   . S/P below knee amputation, right ([place]) 09/03/2017  . Drug induced constipation   . Tobacco abuse   . Benign essential HTN   . Type 2 diabetes mellitus with peripheral neuropathy (HCC)   . Acute blood loss anemia   . Post-operative pain   . Subacute osteomyelitis, right ankle and foot ([place])   . Diabetic foot infection ([place])   . Sepsis ([place])   . Severe sepsis ([place]) 08/24/2017  . Essential hypertension 09/04/2016  . Poorly controlled type 1 diabetes mellitus with complication ([place]) 03/27/8526  . Diabetic gastroparesis associated with type 1 diabetes mellitus ([place]) 09/03/2016    Willow Ora, PTA, [facility] [address], [place], [place] [address] [phone] 12/19/17, 12:59 PM   Name: Skippy Marhefka MRN: [id number] Date of Birth: Jul 20, 1990

## 2017-12-22 ENCOUNTER — Ambulatory Visit: Payer: Federal, State, Local not specified - PPO | Admitting: Physical Therapy

## 2017-12-22 ENCOUNTER — Encounter: Payer: Self-pay | Admitting: Physical Therapy

## 2017-12-22 DIAGNOSIS — R2681 Unsteadiness on feet: Secondary | ICD-10-CM

## 2017-12-22 DIAGNOSIS — R2689 Other abnormalities of gait and mobility: Secondary | ICD-10-CM

## 2017-12-22 DIAGNOSIS — M6281 Muscle weakness (generalized): Secondary | ICD-10-CM

## 2017-12-22 DIAGNOSIS — R293 Abnormal posture: Secondary | ICD-10-CM

## 2017-12-22 NOTE — Therapy (Addendum)
[facility] [address] [place], [place], [address] Phone: [phone]   Fax:  [phone]  Physical Therapy Treatment  Patient Details  Name: Christopher Callahan MRN: [id number] Date of Birth: 1990-07-17 Referring Provider: Meridee Score, MD   Encounter Date: 12/22/2017  PT End of Session - 12/22/17 1016    Visit Number  7    Number of Visits  18    Date for PT Re-Evaluation  01/23/18    Authorization Type  [employer]  [id number], $4225.72 Met,  visit limit combined PT/OT/ST 50 with 0 used prior to eval.     PT Start Time  0851    PT Stop Time  0929    PT Time Calculation (min)  38 min    Equipment Utilized During Treatment  Gait belt    Activity Tolerance  Patient tolerated treatment well    Behavior During Therapy  [facility] for tasks assessed/performed       Past Medical History:  Diagnosis Date  . Diabetes mellitus without complication [facility])     Past Surgical History:  Procedure Laterality Date  . AMPUTATION Right 08/27/2017   Procedure: RIGHT BELOW KNEE AMPUTATION;  Surgeon: Newt Minion, MD;  Location: [facility];  Service: Orthopedics;  Laterality: Right;  . CHOLECYSTECTOMY    . SKIN GRAFT      There were no vitals filed for this visit.  Subjective Assessment - 12/22/17 0852    Subjective  No new complaints. No falls or pain to report.     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  No/denies         [facility] PT Assessment - 12/22/17 0853      Berg Balance Test   Sit to Stand  Able to stand without using hands and stabilize independently    Standing Unsupported  Able to stand safely 2 minutes    Sitting with Back Unsupported but Feet Supported on Floor or Stool  Able to sit safely and securely 2  minutes    Stand to Sit  Sits safely with minimal use of hands    Transfers  Able to transfer safely, minor use of hands    Standing Unsupported with Eyes Closed  Able to stand 10 seconds safely    Standing Ubsupported with Feet Together  Able to place feet together independently and stand 1 minute safely    From Standing, Reach Forward with Outstretched Arm  Can reach confidently >25 cm (10")    From Standing Position, Pick up Object from Floor  Able to pick up shoe safely and easily    From Standing Position, Turn to Look Behind Over each Shoulder  Looks behind from both sides and weight shifts well    Turn 360 Degrees  Able to turn 360 degrees safely in 4 seconds or less    Standing Unsupported, Alternately Place Feet on Step/Stool  Able to stand independently and safely and complete 8 steps in 20 seconds    Standing Unsupported, One Foot in Front  Able to take small step independently and hold 30 seconds    Standing on One Leg  Able to lift leg independently and hold equal to or more than 3 seconds    Total Score  52  Dynamic Gait Index   Level Surface  Normal    Change in Gait Speed  Normal    Gait with Horizontal Head Turns  Normal    Gait with Vertical Head Turns  Mild Impairment    Gait and Pivot Turn  Normal    Step Over Obstacle  Normal    Step Around Obstacles  Normal    Steps  Mild Impairment    Total Score  22      Functional Gait  Assessment   Gait assessed   Yes    Gait Level Surface  Walks 20 ft in less than 5.5 sec, no assistive devices, good speed, no evidence for imbalance, normal gait pattern, deviates no more than 6 in outside of the 12 in walkway width.    Change in Gait Speed  Able to smoothly change walking speed without loss of balance or gait deviation. Deviate no more than 6 in outside of the 12 in walkway width.    Gait with Horizontal Head Turns  Performs head turns smoothly with no change in gait. Deviates no more than 6 in outside 12 in walkway width     Gait with Vertical Head Turns  Performs head turns with no change in gait. Deviates no more than 6 in outside 12 in walkway width.    Gait and Pivot Turn  Pivot turns safely in greater than 3 sec and stops with no loss of balance, or pivot turns safely within 3 sec and stops with mild imbalance, requires small steps to catch balance.    Step Over Obstacle  Is able to step over 2 stacked shoe boxes taped together (9 in total height) without changing gait speed. No evidence of imbalance.    Gait with Narrow Base of Support  Ambulates less than 4 steps heel to toe or cannot perform without assistance.    Gait with Eyes Closed  Walks 20 ft, slow speed, abnormal gait pattern, evidence for imbalance, deviates 10-15 in outside 12 in walkway width. Requires more than 9 sec to ambulate 20 ft.    Ambulating Backwards  Walks 20 ft, uses assistive device, slower speed, mild gait deviations, deviates 6-10 in outside 12 in walkway width.    Steps  Alternating feet, must use rail.    Total Score  22          [facility] Adult PT Treatment/Exercise - 12/22/17 1014      Transfers   Transfers  Sit to Stand;Stand to Sit    Sit to Stand  6: Modified independent (Device/Increase time)    Stand to Sit  6: Modified independent (Device/Increase time)      Ambulation/Gait   Ambulation/Gait  Yes    Ambulation/Gait Assistance  5: Supervision    Gait velocity  3.21 ft/sec    Stairs  Yes    Stairs Assistance  5: Supervision    Stair Management Technique  One rail Right;Two rails;Alternating pattern;Forwards    Number of Stairs  4 x 2 reps               PT Short Term Goals - 12/22/17 1019      PT SHORT TERM GOAL #1   Title  Patient verbalizes proper cleaning & skin care with prosthesis use. (All STGs Target Date 12/23/2017)    Baseline  12/18/17: met except for with new/unfamiliar concepts    Time  1    Period  Months    Status  Achieved  PT SHORT TERM GOAL #2   Title  Patient tolerates prosthesis  wear >8 hrs /day without skin issues.     Baseline  12/18/17: met today    Time  1    Period  Months    Status  Achieved      PT SHORT TERM GOAL #3   Title  Patient ambulates 300' with cane & prosthesis with supervision.     Baseline  12/18/17: met today with no device    Time  1    Period  Months    Status  Achieved      PT SHORT TERM GOAL #4   Title  Patient negotiates ramps, curbs & stairs 1 rail with cane & prosthesis with supervision.     Baseline  12/18/17: met today with no AD, reciprocal pattern on stairs does require 2 rails, step to pattern with single rail    Time  1    Period  Months    Status  Achieved      PT SHORT TERM GOAL #5   Title  Patient reaches 10" and picks up objects from floor with prosthesis no UE support with supervision.     Baseline  12/22/17: met today with no UE support    Time  1    Period  Months    Status  Achieved       PT Long Term Goals - 12/22/17 0853      PT LONG TERM GOAL #1   Title  Patient verbalizes & demonstrates understanding of proper prosthetic care to enable safe use of prosthesis. (All LTGs Target Date: 01/23/2018)    Baseline  12/22/17: goal met except with new/unfamiliar concepts    Time  --    Period  --    Status  Partially Met      PT LONG TERM GOAL #2   Title  Patient tolerates prosthesis wear >90% of awake hours without skin or limb pain issues to enable function during his day.     Baseline  12/22/17: goal met     Status  Achieved      PT LONG TERM GOAL #3   Title  Patient ambulates 1000' outdoors including grass, ramps, curbs & stairs 1 rail with prosthesis only modified independent.    Status  On-going      PT LONG TERM GOAL #4   Title  Berg Balance with prosthesis only >45/56    Baseline  12/22/17: goal met Berg score with prosthesis only 52/56    Time  --    Period  --    Status  Achieved      PT LONG TERM GOAL #5   Title  Timed Up & Go with prosthesis only <15 seconds.     Baseline  12/22/17: goal met  with prosthesis only 9.81 secs     Time  --    Period  --    Status  Achieved      PT LONG TERM GOAL #6   Title  Dynamic Gait Index with prosthesis only >/= 14/24     Baseline  12/22/17:goal met with prosthesis only 22/24    Time  --    Period  --    Status  Achieved      PT LONG TERM GOAL #7   Title  Gait Velocity with prosthesis only >1.81 ft/sec to indicate lower fall risk.     Baseline  12/22/17: goal met with prosthesis only 3.21 ft/sec  Time  --    Period  --    Status  Achieved         Plan - 12/22/17 1027    Clinical Impression Statement  Today's skilled session focused on finishing STGs and began assessing LTG. Pt met or partailly met STGs and most LTGs met. The Functional Gait Assessment  was also assessed cue to progress made with Dynamic Gait Index.  Due to significant progress PTA discussed potiental for early discharge or goal upgrade with patient and will further discuss with primary PT Jamey Reas, PT, DPT).    Rehab Potential  Good    Clinical Impairments Affecting Rehab Potential  living with mother while recovering,     PT Frequency  2x / week    PT Duration  Other (comment)    PT Treatment/Interventions  ADLs/Self Care Home Management;Canalith Repostioning;DME Instruction;Gait training;Stair training;Functional mobility training;Therapeutic activities;Therapeutic exercise;Balance training;Neuromuscular re-education;Patient/family education;Prosthetic Training;Vestibular    PT Next Visit Plan  check remaining  LTGs if needed after discussion of progress with primary PT regarding early discharge vs continue with updated goals.      Patient will benefit from skilled therapeutic intervention in order to improve the following deficits and impairments:  Abnormal gait, Decreased activity tolerance, Decreased balance, Decreased coordination, Decreased endurance, Decreased knowledge of use of DME, Decreased mobility, Decreased range of motion, Decreased skin  integrity, Decreased strength, Dizziness, Increased edema, Impaired flexibility, Postural dysfunction, Prosthetic Dependency  Visit Diagnosis: Muscle weakness (generalized)  Unsteadiness on feet  Other abnormalities of gait and mobility  Abnormal posture     Problem List Patient Active Problem List   Diagnosis Date Noted  . Hyponatremia   . Unilateral complete BKA, right, sequela ([place])   . Poorly controlled type 2 diabetes mellitus with peripheral neuropathy ([place])   . Hyperkalemia   . AKI (acute kidney injury) ([place])   . S/P below knee amputation, right ([place]) 09/03/2017  . Drug induced constipation   . Tobacco abuse   . Benign essential HTN   . Type 2 diabetes mellitus with peripheral neuropathy (H[facility])   . Acute blood loss anemia   . Post-operative pain   . Subacute osteomyelitis, right ankle and foot ([place])   . Diabetic foot infection ([place])   . Sepsis ([place])   . Severe sepsis ([place]) 08/24/2017  . Essential hypertension 09/04/2016  . Poorly controlled type 1 diabetes mellitus with complication ([place]) 03/88/8280  . Diabetic gastroparesis associated with type 1 diabetes mellitus [facility]) 09/03/2016   Halina Andreas, [place] 12/22/2017, 10:39 AM  [facility] [address] [place], [place], [address] Phone: [phone]   Fax:  [phone]  Name: Christopher Callahan MRN: [id number] Date of Birth: 10-29-1990   PT Long Term Goals - 12/24/17 1207      PT LONG TERM GOAL #1   Title  Patient verbalizes & demonstrates understanding of proper prosthetic care including problem solving new issues & when to see prosthetist to enable safe use of prosthesis. (All LTGs Target Date: 01/23/2018)    Status  Revised    Target Date  01/23/18      PT LONG TERM GOAL #2   Title  Patient tolerates prosthesis wear >90% of awake hours without skin or limb pain issues to enable function during his day.     Status  On-going    Target Date  01/23/18       PT LONG TERM GOAL #3   Title  Patient ambulates 2500' indoors/outdoors  including grass, ramps, curbs & stairs 1 rail with prosthesis only independently.    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     Status  New    Target Date  01/23/18      PT LONG TERM GOAL #6   Title  Patient verbalizes & demonstrates understanding of ongoing fitness plan including exercises for gym.     Status  New    Target Date  01/23/18      Jamey Reas, PT, DPT PT Specializing in [place] 12/24/17 12:13 PM Phone:  [phone]  Fax:  [phone] [place] [address] [place], [place] [address]

## 2017-12-24 ENCOUNTER — Ambulatory Visit: Payer: Federal, State, Local not specified - PPO | Admitting: Physical Therapy

## 2017-12-24 ENCOUNTER — Encounter: Payer: Self-pay | Admitting: Physical Therapy

## 2017-12-24 DIAGNOSIS — R293 Abnormal posture: Secondary | ICD-10-CM

## 2017-12-24 DIAGNOSIS — M6281 Muscle weakness (generalized): Secondary | ICD-10-CM | POA: Diagnosis not present

## 2017-12-24 DIAGNOSIS — R2681 Unsteadiness on feet: Secondary | ICD-10-CM

## 2017-12-24 DIAGNOSIS — R2689 Other abnormalities of gait and mobility: Secondary | ICD-10-CM

## 2017-12-24 NOTE — Therapy (Signed)
[facility] [address] [place], [place], [address] Phone: [phone]   Fax:  [phone]  Physical Therapy Treatment  Patient Details  Name: Christopher Callahan MRN: [id number] Date of Birth: 12/30/1990 Referring Provider: Meridee Score, MD   Encounter Date: 12/24/2017  PT End of Session - 12/24/17 0933    Visit Number  8    Number of Visits  18    Date for PT Re-Evaluation  01/23/18    Authorization Type  [employer]  [id number], $4225.72 Met,  visit limit combined PT/OT/ST 50 with 0 used prior to eval.     PT Start Time  0845    PT Stop Time  0930    PT Time Calculation (min)  45 min    Equipment Utilized During Treatment  Gait belt    Activity Tolerance  Patient tolerated treatment well    Behavior During Therapy  [facility] for tasks assessed/performed       Past Medical History:  Diagnosis Date  . Diabetes mellitus without complication [facility])     Past Surgical History:  Procedure Laterality Date  . AMPUTATION Right 08/27/2017   Procedure: RIGHT BELOW KNEE AMPUTATION;  Surgeon: Newt Minion, MD;  Location: [facility];  Service: Orthopedics;  Laterality: Right;  . CHOLECYSTECTOMY    . SKIN GRAFT      There were no vitals filed for this visit.  Subjective Assessment - 12/24/17 0844    Subjective  No new complaints. No falls or pain to report.     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  No/denies          Prosthetics Assessment - 12/24/17 0938      Prosthetics   Current prosthetic wear tolerance (#hours/day)   >/ 8 hrs per day    Residual limb condition   pt report no issues          [facility] Adult PT Treatment/Exercise - 12/24/17 0938      High Level Balance   High Level Balance Activities   Side stepping;Braiding;Backward walking;Tandem walking    High Level Balance Comments  in // bars tandem stepping and braiding x 4 sets each with min guard and intermittent UE support required to maintain balance, verbal cues on maintaining posture and braiding technique.  With agility ladder pt instructed on dynamic gait and coordination  Pt required demo + verbal cues on proper technique and posture with each ex.        Therapeutic Activites    Therapeutic Activities  Work [employer]    Work Freight forwarder with prosthesis. Demo + verbal cueing on proper technique. Pushing and pulling weighted cart x 2 sets. Pt needing verbal cues on posture to safey perform.         Neuro Re-ed    Neuro Re-ed Details   in // bars on Airex with feet apart EO then EC x 30 secs each, no UE support and min guard assist for balance. Progressing head turns/ nods x 10 reps x 2 sets each, once with EO then last set with EC. Pt requiring min guard assist and intermittent UE support with EC. With feet together on Airex EO x 30 secs adding head turns and nods x 10 reps x 2  sets each. Pt requring min guard and hovering hands over // bars to maintain balance. LOB x 1 but regained balance with UE support on bars. Working on SLS and increasing wt bearing on prosthesis pt instructed with cone tapping x 10 reps x 1 set. intermittent UE support and min guard to prevent LOB. Progressing to red balance beam lightly tapping balance bubble x 10 reps. pt needing VCs on posture and requried UE support.      Knee/Hip Exercises: Standing   Other Standing Knee Exercises  side stepping in squat position x 8 steps x 3 sets. no ue support and min guard assist to maintain balance.      Prosthetics   Current prosthetic wear tolerance (days/week)   everyday         PT Short Term Goals - 12/22/17 1019      PT SHORT TERM GOAL #1   Title  Patient verbalizes proper cleaning & skin care with prosthesis use. (All STGs Target Date  12/23/2017)    Baseline  12/18/17: met except for with new/unfamiliar concepts    Time  1    Period  Months    Status  Achieved      PT SHORT TERM GOAL #2   Title  Patient tolerates prosthesis wear >8 hrs /day without skin issues.     Baseline  12/18/17: met today    Time  1    Period  Months    Status  Achieved      PT SHORT TERM GOAL #3   Title  Patient ambulates 300' with cane & prosthesis with supervision.     Baseline  12/18/17: met today with no device    Time  1    Period  Months    Status  Achieved      PT SHORT TERM GOAL #4   Title  Patient negotiates ramps, curbs & stairs 1 rail with cane & prosthesis with supervision.     Baseline  12/18/17: met today with no AD, reciprocal pattern on stairs does require 2 rails, step to pattern with single rail    Time  1    Period  Months    Status  Achieved      PT SHORT TERM GOAL #5   Title  Patient reaches 10" and picks up objects from floor with prosthesis no UE support with supervision.     Baseline  12/22/17: met today with no UE support    Time  1    Period  Months    Status  Achieved        PT Long Term Goals - 12/22/17 0853      PT LONG TERM GOAL #1   Title  Patient verbalizes & demonstrates understanding of proper prosthetic care to enable safe use of prosthesis. (All LTGs Target Date: 01/23/2018)    Baseline  12/22/17: goal met except with new/unfamiliar concepts    Time  --    Period  --    Status  Partially Met      PT LONG TERM GOAL #2   Title  Patient tolerates prosthesis wear >90% of awake hours without skin or limb pain issues to enable function during his day.     Baseline  12/22/17: goal met     Status  Achieved      PT LONG TERM GOAL #3   Title  Patient ambulates 1000' outdoors including grass, ramps, curbs & stairs 1 rail with prosthesis only modified independent.      Status  On-going      PT LONG TERM GOAL #4   Title  Berg Balance with prosthesis only >45/56    Baseline  12/22/17: goal met Berg score  with prosthesis only 52/56    Time  --    Period  --    Status  Achieved      PT LONG TERM GOAL #5   Title  Timed Up & Go with prosthesis only <15 seconds.     Baseline  12/22/17: goal met with prosthesis only 9.81 secs     Time  --    Period  --    Status  Achieved      PT LONG TERM GOAL #6   Title  Dynamic Gait Index with prosthesis only >/= 14/24     Baseline  12/22/17:goal met with prosthesis only 22/24    Time  --    Period  --    Status  Achieved      PT LONG TERM GOAL #7   Title  Gait Velocity with prosthesis only >1.81 ft/sec to indicate lower fall risk.     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 Due to weather the last LTG couldn't be assessed but pt should be able to meet next session,. Speak with primary PT about updating goals or early d/c for patient next session.     Rehab Potential  Good    Clinical Impairments Affecting Rehab Potential  living with mother while recovering,     PT Frequency  2x / week    PT Duration  Other (comment)    PT Treatment/Interventions  ADLs/Self Care Home Management;Canalith Repostioning;DME Instruction;Gait training;Stair training;Functional mobility training;Therapeutic activities;Therapeutic exercise;Balance training;Neuromuscular re-education;Patient/family education;Prosthetic Training;Vestibular    PT Next Visit Plan  check remaining  LTGs. then discuss progress with primary PT on goal update or potential to discharge early (in next week or so) depending on goal progress.        Patient will benefit from skilled therapeutic intervention in order to improve the following deficits and impairments:  Abnormal gait, Decreased activity tolerance, Decreased balance, Decreased  coordination, Decreased endurance, Decreased knowledge of use of DME, Decreased mobility, Decreased range of motion, Decreased skin integrity, Decreased strength, Dizziness, Increased edema, Impaired flexibility, Postural dysfunction, Prosthetic Dependency  Visit Diagnosis: Muscle weakness (generalized)  Unsteadiness on feet  Other abnormalities of gait and mobility  Abnormal posture     Problem List Patient Active Problem List   Diagnosis Date Noted  . Hyponatremia   . Unilateral complete BKA, right, sequela (HCC)   . Poorly controlled type 2 diabetes mellitus with peripheral neuropathy (HCC)   . Hyperkalemia   . AKI (acute kidney injury) (HCC)   . S/P below knee amputation, right (HCC) 09/03/2017  . Drug induced constipation   . Tobacco abuse   . Benign essential HTN   . Type 2 diabetes mellitus with peripheral neuropathy (HCC)   . Acute blood loss anemia   . Post-operative pain   . Subacute osteomyelitis, right ankle and foot (HCC)   . Diabetic foot infection (HCC)   . Sepsis (HCC)   . Severe   sepsis ([place]) 08/24/2017  . Essential hypertension 09/04/2016  . Poorly controlled type 1 diabetes mellitus with complication ([place]) 79/15/0569  . Diabetic gastroparesis associated with type 1 diabetes mellitus [facility]) 09/03/2016   Halina Andreas, [place] 12/24/2017, 10:06 AM  [facility] [address] [place], [place], [address] Phone: [phone]   Fax:  [phone]  Name: Christopher Callahan MRN: [id number] Date of Birth: 03/25/1991

## 2017-12-25 ENCOUNTER — Ambulatory Visit (INDEPENDENT_AMBULATORY_CARE_PROVIDER_SITE_OTHER): Payer: Federal, State, Local not specified - PPO | Admitting: Orthopedic Surgery

## 2017-12-25 ENCOUNTER — Encounter (INDEPENDENT_AMBULATORY_CARE_PROVIDER_SITE_OTHER): Payer: Self-pay | Admitting: Orthopedic Surgery

## 2017-12-25 DIAGNOSIS — Z89511 Acquired absence of right leg below knee: Secondary | ICD-10-CM | POA: Diagnosis not present

## 2017-12-25 NOTE — Progress Notes (Signed)
Office Visit Note   [name]           Date of Birth: May 02, 1991           MRN: [id number] Visit Date: 12/25/2017              Requested by: Lewis Moccasinewey, Elizabeth R, MD [address] [place], [place] [id number] PCP: Lewis Moccasinewey, Elizabeth R, MD  Chief Complaint  Patient presents with  . Right Leg - Routine Post Op      HPI: Patient is a 27 year old gentleman who presents 4 months status post right transtibial amputation.  He is working with biotech for prosthetic fitting.  Assessment & Plan: Visit Diagnoses:  1. Acquired absence of right leg below knee [facility](HCC)     Plan: Patient will continue to increase his activities as tolerated.  Discussed that if he develops any new blisters or ulcers he needs to follow-up with biotech for modification to the socket to decrease subsidence with in the socket due to loss of residual volume.  Follow-Up Instructions: Return in about 2 months (around 02/25/2018).   Ortho Exam  Patient is alert, oriented, no adenopathy, well-dressed, normal affect, normal respiratory effort. Examination patient does have some venous stasis swelling in the left lower extremity.  Examination the right transtibial amputation there is no blisters no ulcers no drainage no swelling no signs of infection he has full active extension.  He has a well consolidated residual limb.  Imaging: No results found. No images are attached to the encounter.  Labs: Lab Results  Component Value Date   HGBA1C 11.1 (H) 08/25/2017   ESRSEDRATE 107 (H) 08/24/2017   CRP 27.6 (H) 08/27/2017   CRP 29.1 (H) 08/24/2017   REPTSTATUS 08/25/2017 FINAL 08/24/2017   CULT  08/24/2017    NO GROWTH Performed at [facility], [address] [place], [place] [address]      Lab Results  Component Value Date   ALBUMIN 4.2 10/07/2017   ALBUMIN 2.5 (L) 09/04/2017   ALBUMIN 1.9 (L) 08/25/2017    There is no height or weight on file to calculate BMI.  Orders:  No orders  of the defined types were placed in this encounter.  No orders of the defined types were placed in this encounter.    Procedures: No procedures performed  Clinical Data: No additional findings.  ROS:  All other systems negative, except as noted in the HPI. Review of Systems  Objective: Vital Signs: There were no vitals taken for this visit.  Specialty Comments:  No specialty comments available.  PMFS History: Patient Active Problem List   Diagnosis Date Noted  . Hyponatremia   . Unilateral complete BKA, right, sequela (HCC)   . Poorly controlled type 2 diabetes mellitus with peripheral neuropathy (HCC)   . Hyperkalemia   . AKI (acute kidney injury) (HCC)   . S/P below knee amputation, right (HCC) 09/03/2017  . Drug induced constipation   . Tobacco abuse   . Benign essential HTN   . Type 2 diabetes mellitus with peripheral neuropathy (HCC)   . Acute blood loss anemia   . Post-operative pain   . Subacute osteomyelitis, right ankle and foot (HCC)   . Diabetic foot infection (HCC)   . Sepsis (HCC)   . Severe sepsis (HCC) 08/24/2017  . Essential hypertension 09/04/2016  . Poorly controlled type 1 diabetes mellitus with complication (HCC) 09/03/2016  . Diabetic gastroparesis associated with type 1 diabetes mellitus (HCC) 09/03/2016  Past Medical History:  Diagnosis Date  . Diabetes mellitus without complication (HCC)     Family History  Problem Relation Age of Onset  . COPD Father     Past Surgical History:  Procedure Laterality Date  . AMPUTATION Right 08/27/2017   Procedure: RIGHT BELOW KNEE AMPUTATION;  Surgeon: Nadara Mustard, MD;  Location: [facility];  Service: Orthopedics;  Laterality: Right;  . CHOLECYSTECTOMY    . SKIN GRAFT     Social History   Occupational History  . Not on file  Tobacco Use  . Smoking status: Current Every Day Smoker    Packs/day: 0.30    Types: Cigarettes  . Smokeless tobacco: Never Used  Substance and Sexual Activity  . Alcohol  use: No  . Drug use: No  . Sexual activity: Not on file

## 2017-12-29 ENCOUNTER — Encounter: Payer: Self-pay | Admitting: Physical Therapy

## 2017-12-29 ENCOUNTER — Ambulatory Visit: Payer: Federal, State, Local not specified - PPO | Admitting: Physical Therapy

## 2017-12-29 DIAGNOSIS — M6281 Muscle weakness (generalized): Secondary | ICD-10-CM | POA: Diagnosis not present

## 2017-12-29 DIAGNOSIS — R2681 Unsteadiness on feet: Secondary | ICD-10-CM

## 2017-12-29 DIAGNOSIS — R2689 Other abnormalities of gait and mobility: Secondary | ICD-10-CM

## 2017-12-30 NOTE — Therapy (Signed)
[facility] [address] [place], [place], [address] Phone: [phone]   Fax:  [phone]  Physical Therapy Treatment  Patient Details  Name: Christopher Callahan MRN: [id number] Date of Birth: 05-12-1991 Referring Provider: Meridee Score, MD   Encounter Date: 12/29/2017  PT End of Session - 12/29/17 1743    Visit Number  9    Number of Visits  18    Date for PT Re-Evaluation  01/23/18    Authorization Type  [employer]  [id number], $4225.72 Met,  visit limit combined PT/OT/ST 50 with 0 used prior to eval.     PT Start Time  0847    PT Stop Time  0930    PT Time Calculation (min)  43 min    Equipment Utilized During Treatment  Gait belt    Activity Tolerance  Patient tolerated treatment well    Behavior During Therapy  [facility] for tasks assessed/performed       Past Medical History:  Diagnosis Date  . Diabetes mellitus without complication [facility])     Past Surgical History:  Procedure Laterality Date  . AMPUTATION Right 08/27/2017   Procedure: RIGHT BELOW KNEE AMPUTATION;  Surgeon: Newt Minion, MD;  Location: [facility];  Service: Orthopedics;  Laterality: Right;  . CHOLECYSTECTOMY    . SKIN GRAFT      There were no vitals filed for this visit.  Subjective Assessment - 12/29/17 1742    Subjective  No new complaints. No falls or pain to report.     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  No/denies       [facility] Adult PT Treatment/Exercise - 12/29/17 1747      Transfers   Transfers  Sit to Stand;Stand to Sit    Stand to Sit  6: Modified independent (Device/Increase time)      Ambulation/Gait   Ambulation/Gait  Yes    Ambulation/Gait Assistance  6: Modified independent (Device/Increase time)    Ambulation/Gait Assistance Details  one episode of foot/toe scuffing noted, no balance loss    Ambulation Distance (Feet)  1000 Feet    Assistive device  None;Prosthesis    Gait Pattern  Step-through pattern;Decreased stride length;Decreased stance time - right;Decreased step length - left      Therapeutic Activites    Work Simulation  had pt carry tray with "drinks" (cones) on it, including around obstacles with supervision. lifting 15# crate from floor, carrying it ~20 feet to table, lifting off table, carring for 20 feet and placing on floor, then sliding under mat. all with supervision.       Neuro Re-ed    Neuro Re-ed Details   for balance/coordination: forward gait along ~50 foot hallway with head movements left<>fwd<>right, then up<>fwd<>down x 4 laps each with min guard assist. slight decr in gait speed noted with minor veering at times.      Prosthetics   Prosthetic Care Comments   Pt is to call and set up appt with prosthetist to have pads added and any needed adjustments made.    Current prosthetic wear tolerance (days/week)   daily    Current prosthetic wear tolerance (#hours/day)   all awake hours, drying as needed    Residual limb condition   had a new blister  develpe on shin area. Dr. Sharol Given has seen it and recommeded pt see prosthesist for pads and new shrinker. Appears to be healing     Education Provided  Residual limb care;Proper weight-bearing schedule/adjustment    Person(s) Educated  Patient    Education Method  Explanation;Demonstration;Verbal cues    Education Method  Verbalized understanding;Verbal cues required;Needs further instruction         PT Short Term Goals - 12/22/17 1019      PT SHORT TERM GOAL #1   Title  Patient verbalizes proper cleaning & skin care with prosthesis use. (All STGs Target Date 12/23/2017)    Baseline  12/18/17: met except for with new/unfamiliar concepts    Time  1    Period  Months    Status  Achieved      PT SHORT TERM GOAL #2    Title  Patient tolerates prosthesis wear >8 hrs /day without skin issues.     Baseline  12/18/17: met today    Time  1    Period  Months    Status  Achieved      PT SHORT TERM GOAL #3   Title  Patient ambulates 300' with cane & prosthesis with supervision.     Baseline  12/18/17: met today with no device    Time  1    Period  Months    Status  Achieved      PT SHORT TERM GOAL #4   Title  Patient negotiates ramps, curbs & stairs 1 rail with cane & prosthesis with supervision.     Baseline  12/18/17: met today with no AD, reciprocal pattern on stairs does require 2 rails, step to pattern with single rail    Time  1    Period  Months    Status  Achieved      PT SHORT TERM GOAL #5   Title  Patient reaches 10" and picks up objects from floor with prosthesis no UE support with supervision.     Baseline  12/22/17: met today with no UE support    Time  1    Period  Months    Status  Achieved        PT Long Term Goals - 12/24/17 1207      PT LONG TERM GOAL #1   Title  Patient verbalizes & demonstrates understanding of proper prosthetic care including problem solving new issues & when to see prosthetist to enable safe use of prosthesis. (All LTGs Target Date: 01/23/2018)    Status  Revised    Target Date  01/23/18      PT LONG TERM GOAL #2   Title  Patient tolerates prosthesis wear >90% of awake hours without skin or limb pain issues to enable function during his day.     Status  On-going    Target Date  01/23/18      PT LONG TERM GOAL #3   Title  Patient ambulates 2500' indoors/outdoors including grass, ramps, curbs & stairs 1 rail with prosthesis only independently.    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  Status  New    Target Date  01/23/18      PT LONG TERM GOAL #6   Title  Patient verbalizes & demonstrates understanding of ongoing fitness plan including exercises for gym.     Status  New    Target Date  01/23/18          Plan - 12/29/17 1743    Clinical Impression Statement  Today's skilled session continued to focus on high level balance activities with prosthesis and work simulation activities. Pt is to bring in a complete list of his job duties to his next appt on Wednesday to allow PT to better prepare him for returning to work. Pt is progressing toward goals and should benefit from continued PT to progress toward unmet goals.     Rehab Potential  Good    Clinical Impairments Affecting Rehab Potential  living with mother while recovering,     PT Frequency  2x / week    PT Duration  Other (comment)    PT Treatment/Interventions  ADLs/Self Care Home Management;Canalith Repostioning;DME Instruction;Gait training;Stair training;Functional mobility training;Therapeutic activities;Therapeutic exercise;Balance training;Neuromuscular re-education;Patient/family education;Prosthetic Training;Vestibular    PT Next Visit Plan  continue to work toward updated STGs/LTGs. Go over pt's list of job duties and continue with work simulation activities in therapy       Patient will benefit from skilled therapeutic intervention in order to improve the following deficits and impairments:  Abnormal gait, Decreased activity tolerance, Decreased balance, Decreased coordination, Decreased endurance, Decreased knowledge of use of DME, Decreased mobility, Decreased range of motion, Decreased skin integrity, Decreased strength, Dizziness, Increased edema, Impaired flexibility, Postural dysfunction, Prosthetic Dependency  Visit Diagnosis: Muscle weakness (generalized)  Unsteadiness on feet  Other abnormalities of gait and mobility     Problem List Patient Active Problem List    Diagnosis Date Noted  . Hyponatremia   . Unilateral complete BKA, right, sequela ([place])   . Poorly controlled type 2 diabetes mellitus with peripheral neuropathy ([place])   . Hyperkalemia   . AKI (acute kidney injury) ([place])   . S/P below knee amputation, right ([place]) 09/03/2017  . Drug induced constipation   . Tobacco abuse   . Benign essential HTN   . Type 2 diabetes mellitus with peripheral neuropathy (HCC)   . Acute blood loss anemia   . Post-operative pain   . Subacute osteomyelitis, right ankle and foot ([place])   . Diabetic foot infection ([place])   . Sepsis ([place])   . Severe sepsis ([place]) 08/24/2017  . Essential hypertension 09/04/2016  . Poorly controlled type 1 diabetes mellitus with complication ([place]) 05/26/4974  . Diabetic gastroparesis associated with type 1 diabetes mellitus ([place]) 09/03/2016    Willow Ora, PTA, [facility] [address], [place], [place] [address] [phone] 12/30/17, 5:49 PM   Name: Christopher Callahan MRN: [id number] Date of Birth: 11/09/1990

## 2017-12-31 ENCOUNTER — Ambulatory Visit: Payer: Federal, State, Local not specified - PPO | Admitting: Physical Therapy

## 2017-12-31 ENCOUNTER — Encounter: Payer: Self-pay | Admitting: Physical Therapy

## 2017-12-31 DIAGNOSIS — M6281 Muscle weakness (generalized): Secondary | ICD-10-CM

## 2017-12-31 DIAGNOSIS — R2681 Unsteadiness on feet: Secondary | ICD-10-CM

## 2017-12-31 DIAGNOSIS — R2689 Other abnormalities of gait and mobility: Secondary | ICD-10-CM

## 2017-12-31 NOTE — Therapy (Signed)
[facility] [address] [place], [place], [address] Phone: [phone]   Fax:  [phone]  Physical Therapy Treatment  Patient Details  Name: Christopher Callahan MRN: [id number] Date of Birth: 01/06/1991 Referring Provider: Meridee Score, MD   Encounter Date: 12/31/2017  PT End of Session - 12/31/17 0928    Visit Number  10    Number of Visits  18    Date for PT Re-Evaluation  01/23/18    Authorization Type  [employer]  [id number], $4225.72 Met,  visit limit combined PT/OT/ST 50 with 0 used prior to eval.     PT Start Time  0847    PT Stop Time  0930    PT Time Calculation (min)  43 min    Equipment Utilized During Treatment  Gait belt    Activity Tolerance  Patient tolerated treatment well    Behavior During Therapy  [facility] for tasks assessed/performed       Past Medical History:  Diagnosis Date  . Diabetes mellitus without complication [facility])     Past Surgical History:  Procedure Laterality Date  . AMPUTATION Right 08/27/2017   Procedure: RIGHT BELOW KNEE AMPUTATION;  Surgeon: Newt Minion, MD;  Location: [facility];  Service: Orthopedics;  Laterality: Right;  . CHOLECYSTECTOMY    . SKIN GRAFT      There were no vitals filed for this visit.  Subjective Assessment - 12/31/17 1305    Subjective  No new complaints. No falls or pain to report.  Has prothestist appointment next week.                       [facility] Adult PT Treatment/Exercise - 12/31/17 0001      Ambulation/Gait   Ambulation/Gait  Yes    Ambulation/Gait Assistance  6: Modified independent (Device/Increase time);4: Min guard    Ambulation/Gait Assistance Details  Mod I overall; minguard on unlevel pine mulch due to slightly slipping                                  Ambulation Distance (Feet)  2000 Feet    Assistive device  None;Prosthesis    Gait Pattern  Step-through pattern;Decreased stride length;Decreased stance time - right;Decreased  step length - left    Ambulation Surface  Outdoor;Paved;Gravel;Grass;Level;Unlevel;Indoor    Ramp  6: Modified independent (Device) carrying simulated garbage bag    Curb  6: Modified independent (Device/increase time)      Prosthetics   Prosthetic Care Comments   discussed work related activities and how to perform with prosthesis    Current prosthetic wear tolerance (days/week)   daily    Current prosthetic wear tolerance (#hours/day)   all awake hours, drying as needed    Residual limb condition   blister/scab is gone from last time.    Education Provided  Residual limb care;Proper weight-bearing schedule/adjustment    Education Method  Explanation;Demonstration;Verbal cues          Balance Exercises - 12/31/17 1303      Balance Exercises: Standing   Other Standing Exercises  simulated sweeping, mopping task with multidirectional movements; picking up/down box with #25 and walking short distance, multiple reps                          PT Education - 12/31/17 0926    Education Details  Simulated Work related activities: sweeping, mopping, carrying out trash,  fill ice bins, stock beer coolers, take out empty  keg.  Importance and benefits of cardiovascular exercise.    Person(s) Educated  Patient    Methods  Explanation;Demonstration;Verbal cues    Comprehension  Verbalized understanding;Returned demonstration       PT Short Term Goals - 12/22/17 1019      PT SHORT TERM GOAL #1   Title  Patient verbalizes proper cleaning & skin care with prosthesis use. (All STGs Target Date 12/23/2017)    Baseline  12/18/17: met except for with new/unfamiliar concepts    Time  1    Period  Months    Status  Achieved      PT SHORT TERM GOAL #2   Title  Patient tolerates prosthesis wear >8 hrs /day without skin issues.     Baseline  12/18/17: met today    Time  1    Period  Months    Status  Achieved      PT SHORT TERM GOAL #3   Title  Patient ambulates 300' with cane & prosthesis  with supervision.     Baseline  12/18/17: met today with no device    Time  1    Period  Months    Status  Achieved      PT SHORT TERM GOAL #4   Title  Patient negotiates ramps, curbs & stairs 1 rail with cane & prosthesis with supervision.     Baseline  12/18/17: met today with no AD, reciprocal pattern on stairs does require 2 rails, step to pattern with single rail    Time  1    Period  Months    Status  Achieved      PT SHORT TERM GOAL #5   Title  Patient reaches 10" and picks up objects from floor with prosthesis no UE support with supervision.     Baseline  12/22/17: met today with no UE support    Time  1    Period  Months    Status  Achieved        PT Long Term Goals - 12/24/17 1207      PT LONG TERM GOAL #1   Title  Patient verbalizes & demonstrates understanding of proper prosthetic care including problem solving new issues & when to see prosthetist to enable safe use of prosthesis. (All LTGs Target Date: 01/23/2018)    Status  Revised    Target Date  01/23/18      PT LONG TERM GOAL #2   Title  Patient tolerates prosthesis wear >90% of awake hours without skin or limb pain issues to enable function during his day.     Status  On-going    Target Date  01/23/18      PT LONG TERM GOAL #3   Title  Patient ambulates 2500' indoors/outdoors including grass, ramps, curbs & stairs 1 rail with prosthesis only independently.    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     Status  New    Target Date  01/23/18  PT LONG TERM GOAL #6   Title  Patient verbalizes & demonstrates understanding of ongoing fitness plan including exercises for gym.     Status  New    Target Date  01/23/18             Plan - 12/31/17 1139    Clinical Impression Statement  Simulated work activities and dynamic gait training outdoors; pt performed without LOB and required min to no cueing after education on technique of work related tasks.  Educated on importance of cardiovascular training and pt worked on scifit stepper 8 min  Level up to 5; pt reported being challenged but had no issues.                                                  Rehab Potential  Good    Clinical Impairments Affecting Rehab Potential  living with mother while recovering,     PT Frequency  2x / week    PT Duration  Other (comment)    PT Treatment/Interventions  ADLs/Self Care Home Management;Canalith Repostioning;DME Instruction;Gait training;Stair training;Functional mobility training;Therapeutic activities;Therapeutic exercise;Balance training;Neuromuscular re-education;Patient/family education;Prosthetic Training;Vestibular    PT Next Visit Plan  continue to work toward updated STGs/LTGs. FGA type activities;        Patient will benefit from skilled therapeutic intervention in order to improve the following deficits and impairments:  Abnormal gait, Decreased activity tolerance, Decreased balance, Decreased coordination, Decreased endurance, Decreased knowledge of use of DME, Decreased mobility, Decreased range of motion, Decreased skin integrity, Decreased strength, Dizziness, Increased edema, Impaired flexibility, Postural dysfunction, Prosthetic Dependency  Visit Diagnosis: Muscle weakness (generalized)  Unsteadiness on feet  Other abnormalities of gait and mobility     Problem List Patient Active Problem List   Diagnosis Date Noted  . Hyponatremia   . Unilateral complete BKA, right, sequela ([place])   . Poorly controlled type 2 diabetes mellitus with peripheral neuropathy ([place])   . Hyperkalemia   . AKI (acute kidney injury) ([place])   . S/P below knee amputation, right ([place]) 09/03/2017  . Drug induced  constipation   . Tobacco abuse   . Benign essential HTN   . Type 2 diabetes mellitus with peripheral neuropathy (H[facility])   . Acute blood loss anemia   . Post-operative pain   . Subacute osteomyelitis, right ankle and foot ([place])   . Diabetic foot infection ([place])   . Sepsis ([place])   . Severe sepsis ([place]) 08/24/2017  . Essential hypertension 09/04/2016  . Poorly controlled type 1 diabetes mellitus with complication ([place]) 36/14/4315  . Diabetic gastroparesis associated with type 1 diabetes mellitus ([place]) 09/03/2016   Bjorn Loser, PTA  12/31/17, 1:07 PM [facility] [address] [place], [place], [address] Phone: [phone]   Fax:  [phone]  Name: Christopher Callahan MRN: [id number] Date of Birth: 11/09/1990

## 2018-01-05 ENCOUNTER — Ambulatory Visit: Payer: Federal, State, Local not specified - PPO | Attending: Orthopedic Surgery | Admitting: Physical Therapy

## 2018-01-05 ENCOUNTER — Encounter: Payer: Self-pay | Admitting: Physical Therapy

## 2018-01-05 DIAGNOSIS — R2689 Other abnormalities of gait and mobility: Secondary | ICD-10-CM | POA: Diagnosis present

## 2018-01-05 DIAGNOSIS — M6281 Muscle weakness (generalized): Secondary | ICD-10-CM | POA: Insufficient documentation

## 2018-01-05 DIAGNOSIS — R2681 Unsteadiness on feet: Secondary | ICD-10-CM | POA: Diagnosis present

## 2018-01-05 DIAGNOSIS — R293 Abnormal posture: Secondary | ICD-10-CM | POA: Diagnosis present

## 2018-01-05 NOTE — Patient Instructions (Addendum)
Access Code: [email or web address]  URL: [email or web address]  Date: 01/05/2018  Prepared by: Hortencia ConradiKarissa Ceasia Elwell   Exercises Backwards Walking - 6reps - 2 sets - 1-2x daily - 5x weekly Standing Tandem Balance with Counter Support - 4 reps - 30 sec hold - 1-2x daily - 5x weekly Tandem Walking - 6 reps - 2 sets - 1-2x daily - 5x weekly Crossover Lunge - 6 reps - 2 sets (front and back) - 1-2x daily - 5x weekly

## 2018-01-05 NOTE — Therapy (Signed)
[facility] [address] [place], [place], [address] Phone: [phone]   Fax:  [phone]  Physical Therapy Treatment  Patient Details  Name: Christopher Callahan MRN: [id number] Date of Birth: 08-07-1990 Referring Provider: Meridee Score, MD   Encounter Date: 01/05/2018  PT End of Session - 01/05/18 0928    Visit Number  11    Number of Visits  18    Date for PT Re-Evaluation  01/23/18    Authorization Type  [employer]  [id number], $4225.72 Met,  visit limit combined PT/OT/ST 50 with 0 used prior to eval.     PT Start Time  0845    PT Stop Time  0926    PT Time Calculation (min)  41 min    Equipment Utilized During Treatment  Gait belt    Activity Tolerance  Patient tolerated treatment well    Behavior During Therapy  [facility] for tasks assessed/performed       Past Medical History:  Diagnosis Date  . Diabetes mellitus without complication [facility])     Past Surgical History:  Procedure Laterality Date  . AMPUTATION Right 08/27/2017   Procedure: RIGHT BELOW KNEE AMPUTATION;  Surgeon: Newt Minion, MD;  Location: [facility];  Service: Orthopedics;  Laterality: Right;  . CHOLECYSTECTOMY    . SKIN GRAFT      There were no vitals filed for this visit.  Subjective Assessment - 01/05/18 0846    Subjective  Pt thinks prothestist appointment is actually this week.    Currently in Pain?  No/denies                       [facility] Adult PT Treatment/Exercise - 01/05/18 0001      Transfers   Transfers  Floor to Transfer    Floor to Transfer  5: Supervision;With upper extremity assist;Without upper extremity assist    Floor to Transfer Details (indicate cue type and reason)  cues for mechanics; initial transfer with external support then without;      Knee/Hip Exercises: Aerobic   Stepper  sci fit seated stepper L= 5 then back to 4.0 for 10 min, all extremities      Prosthetics   Prosthetic Care Comments   discussed  questions to ask prothetist for continued care for prosthesis when moving back to [place]                          Current prosthetic wear tolerance (days/week)   daily    Current prosthetic wear tolerance (#hours/day)   all awake hours, drying as needed    Current prosthetic weight-bearing tolerance (hours/day)   tolerated functional standing without AD    Residual limb condition   good; no issues    Education Provided  Residual limb care    Person(s) Educated  Patient    Education Method  Explanation    Education Method  Verbalized understanding          Balance Exercises - 01/05/18 0848      Balance Exercises: Standing   Tandem Stance  4 reps;Intermittent upper extremity support;Eyes open;30 secs    Tandem Gait  Intermittent upper extremity support    Retro Gait  5 reps    Sidestepping  Other (comment) crossovers in front and back        PT Education - 01/05/18 0907    Education Details  Updated HEP with higher level balance activities  Person(s) Educated  Patient    Methods  Explanation;Demonstration;Handout    Comprehension  Verbalized understanding;Returned demonstration       PT Short Term Goals - 12/22/17 1019      PT SHORT TERM GOAL #1   Title  Patient verbalizes proper cleaning & skin care with prosthesis use. (All STGs Target Date 12/23/2017)    Baseline  12/18/17: met except for with new/unfamiliar concepts    Time  1    Period  Months    Status  Achieved      PT SHORT TERM GOAL #2   Title  Patient tolerates prosthesis wear >8 hrs /day without skin issues.     Baseline  12/18/17: met today    Time  1    Period  Months    Status  Achieved      PT SHORT TERM GOAL #3   Title  Patient ambulates 300' with cane & prosthesis with supervision.     Baseline  12/18/17: met today with no device    Time  1    Period  Months    Status  Achieved      PT SHORT TERM GOAL #4   Title  Patient negotiates ramps, curbs & stairs 1 rail with cane & prosthesis with supervision.      Baseline  12/18/17: met today with no AD, reciprocal pattern on stairs does require 2 rails, step to pattern with single rail    Time  1    Period  Months    Status  Achieved      PT SHORT TERM GOAL #5   Title  Patient reaches 10" and picks up objects from floor with prosthesis no UE support with supervision.     Baseline  12/22/17: met today with no UE support    Time  1    Period  Months    Status  Achieved        PT Long Term Goals - 12/24/17 1207      PT LONG TERM GOAL #1   Title  Patient verbalizes & demonstrates understanding of proper prosthetic care including problem solving new issues & when to see prosthetist to enable safe use of prosthesis. (All LTGs Target Date: 01/23/2018)    Status  Revised    Target Date  01/23/18      PT LONG TERM GOAL #2   Title  Patient tolerates prosthesis wear >90% of awake hours without skin or limb pain issues to enable function during his day.     Status  On-going    Target Date  01/23/18      PT LONG TERM GOAL #3   Title  Patient ambulates 2500' indoors/outdoors including grass, ramps, curbs & stairs 1 rail with prosthesis only independently.    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     Status  New    Target Date  01/23/18      PT LONG TERM GOAL #6   Title  Patient verbalizes & demonstrates understanding of ongoing fitness plan including exercises for gym.     Status  New    Target Date  01/23/18            Plan - 01/05/18 0909    Clinical Impression Statement  Updated HEP to include higher level balance activities; pt required intermittent UE support for activities with narrow BOS.    Rehab Potential  Good    Clinical Impairments  Affecting Rehab Potential  living with mother while recovering,     PT Frequency  2x / week    PT Duration  Other (comment)    PT Treatment/Interventions  ADLs/Self Care Home Management;Canalith Repostioning;DME Instruction;Gait training;Stair training;Functional mobility training;Therapeutic activities;Therapeutic exercise;Balance training;Neuromuscular re-education;Patient/family education;Prosthetic Training;Vestibular    PT Next Visit Plan  continue to work toward updated STGs/LTGs. FGA type activities;        Patient will benefit from skilled therapeutic intervention in order to improve the following deficits and impairments:  Abnormal gait, Decreased activity tolerance, Decreased balance, Decreased coordination, Decreased endurance, Decreased knowledge of use of DME, Decreased mobility, Decreased range of motion, Decreased skin integrity, Decreased strength, Dizziness, Increased edema, Impaired flexibility, Postural dysfunction, Prosthetic Dependency  Visit Diagnosis: Muscle weakness (generalized)  Unsteadiness on feet  Other abnormalities of gait and mobility     Problem List Patient Active Problem List   Diagnosis Date Noted  . Hyponatremia   . Unilateral complete BKA, right, sequela ([place])   . Poorly controlled type 2 diabetes mellitus with peripheral neuropathy ([place])   . Hyperkalemia   . AKI (acute kidney injury) ([place])   . S/P below knee amputation, right ([place]) 09/03/2017  . Drug induced constipation   . Tobacco abuse   . Benign essential HTN   . Type 2 diabetes mellitus with peripheral neuropathy ([facility])   . Acute blood loss anemia   . Post-operative pain   . Subacute osteomyelitis, right ankle and foot ([place])   . Diabetic foot infection ([place])   . Sepsis ([place])   . Severe sepsis ([place]) 08/24/2017  . Essential hypertension 09/04/2016  . Poorly controlled type 1 diabetes mellitus with complication ([place]) 84/08/7541  . Diabetic gastroparesis associated with type 1 diabetes  mellitus ([place]) 09/03/2016   Bjorn Loser, PTA  01/05/18, 1:30 PM [facility] [address] [place], [place], [address] Phone: [phone]   Fax:  [phone]  Name: Christopher Callahan MRN: [id number] Date of Birth: [birth year]

## 2018-01-07 ENCOUNTER — Ambulatory Visit: Payer: Federal, State, Local not specified - PPO | Admitting: Physical Therapy

## 2018-01-07 ENCOUNTER — Encounter: Payer: Self-pay | Admitting: Physical Therapy

## 2018-01-07 DIAGNOSIS — M6281 Muscle weakness (generalized): Secondary | ICD-10-CM | POA: Diagnosis not present

## 2018-01-07 DIAGNOSIS — R2689 Other abnormalities of gait and mobility: Secondary | ICD-10-CM

## 2018-01-07 DIAGNOSIS — R2681 Unsteadiness on feet: Secondary | ICD-10-CM

## 2018-01-07 NOTE — Therapy (Signed)
[facility] [address] [place], [place], [address] Phone: [phone]   Fax:  [phone]  Physical Therapy Treatment  Patient Details  Name: Christopher Callahan MRN: [id number] Date of Birth: 04/23/1991 Referring Provider: Meridee Score, MD   Encounter Date: 01/07/2018  PT End of Session - 01/07/18 0930    Visit Number  12    Number of Visits  18    Date for PT Re-Evaluation  01/23/18    Authorization Type  [employer]  [id number], $4225.72 Met,  visit limit combined PT/OT/ST 50 with 0 used prior to eval.     PT Start Time  0847    PT Stop Time  0927    PT Time Calculation (min)  40 min    Equipment Utilized During Treatment  Gait belt    Activity Tolerance  Patient tolerated treatment well    Behavior During Therapy  [facility] for tasks assessed/performed       Past Medical History:  Diagnosis Date  . Diabetes mellitus without complication [facility])     Past Surgical History:  Procedure Laterality Date  . AMPUTATION Right 08/27/2017   Procedure: RIGHT BELOW KNEE AMPUTATION;  Surgeon: Newt Minion, MD;  Location: [facility];  Service: Orthopedics;  Laterality: Right;  . CHOLECYSTECTOMY    . SKIN GRAFT      There were no vitals filed for this visit.  Subjective Assessment - 01/07/18 0850    Subjective  [employer] appointment is tomorrow.    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 and active in [facility]/Exercise - 01/07/18 0001      Ambulation/Gait   Ambulation/Gait  Yes    Ambulation/Gait Assistance  6: Modified independent (Device/Increase time)    Ambulation/Gait Assistance Details  Dynamic gait on multilevel surfaces with changes in direction and visual scanning tasks    Ambulation Distance  (Feet)  2500 Feet    Assistive device  None;Prosthesis    Gait Pattern  Step-through pattern;Decreased stride length;Decreased stance time - right;Decreased step length - left    Ambulation Surface  Outdoor;Paved;Gravel;Grass;Unlevel;Level      Knee/Hip Exercises: Aerobic   Stepper  sci fit seated stepper L= 5 then to 5.6, for 10 min, all extremities          Balance Exercises - 01/07/18 0913      Balance Exercises: Standing   Tandem Gait  Intermittent upper extremity support    Partial Tandem Stance  5 reps    Retro Gait  5 reps    Sidestepping  Other (comment);4 reps + crossovers in front and behind    Marching Limitations  holding 2 sec; intermittent UE support x4          PT Short Term Goals - 12/22/17 1019      PT SHORT TERM GOAL #1   Title  Patient verbalizes proper cleaning & skin care with prosthesis use. (All STGs Target Date 12/23/2017)    Baseline  12/18/17: met except for with new/unfamiliar concepts    Time  1    Period  Months    Status  Achieved      PT SHORT TERM GOAL #2   Title  Patient tolerates prosthesis wear >8 hrs /day without skin issues.     Baseline  12/18/17: met today    Time  1    Period  Months    Status  Achieved      PT SHORT TERM GOAL #3   Title  Patient ambulates 300' with cane & prosthesis with supervision.     Baseline  12/18/17: met today with no device    Time  1    Period  Months    Status  Achieved      PT SHORT TERM GOAL #4   Title  Patient negotiates ramps, curbs & stairs 1 rail with cane & prosthesis with supervision.     Baseline  12/18/17: met today with no AD, reciprocal pattern on stairs does require 2 rails, step to pattern with single rail    Time  1    Period  Months    Status  Achieved      PT SHORT TERM GOAL #5   Title  Patient reaches 10" and picks up objects from floor with prosthesis no UE support with supervision.     Baseline  12/22/17: met today with no UE support    Time  1    Period  Months    Status   Achieved        PT Long Term Goals - 12/24/17 1207      PT LONG TERM GOAL #1   Title  Patient verbalizes & demonstrates understanding of proper prosthetic care including problem solving new issues & when to see prosthetist to enable safe use of prosthesis. (All LTGs Target Date: 01/23/2018)    Status  Revised    Target Date  01/23/18      PT LONG TERM GOAL #2   Title  Patient tolerates prosthesis wear >90% of awake hours without skin or limb pain issues to enable function during his day.     Status  On-going    Target Date  01/23/18      PT LONG TERM GOAL #3   Title  Patient ambulates 2500' indoors/outdoors including grass, ramps, curbs & stairs 1 rail with prosthesis only independently.    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     Status  New    Target Date  01/23/18      PT LONG TERM GOAL #6   Title  Patient verbalizes & demonstrates understanding of ongoing fitness plan including exercises for gym.     Status  New    Target Date  01/23/18            Plan - 01/07/18 1046    Clinical Impression Statement  Pt demonstrated progress with dynamic stepping strategies requiring less UE support and greater prosthetic foot control/placement.  Pt made progress with on Sci fit stepper tolerating increase resistence for longer time. Pt demonstrates Mod I with dynamic gait on community type surfaces.  Rehab Potential  Good    Clinical Impairments Affecting Rehab Potential  living with mother while recovering,     PT Frequency  2x / week    PT Duration  Other (comment)    PT Treatment/Interventions  ADLs/Self Care Home Management;Canalith Repostioning;DME  Instruction;Gait training;Stair training;Functional mobility training;Therapeutic activities;Therapeutic exercise;Balance training;Neuromuscular re-education;Patient/family education;Prosthetic Training;Vestibular    PT Next Visit Plan  work towards or Check LTGs?       Patient will benefit from skilled therapeutic intervention in order to improve the following deficits and impairments:  Abnormal gait, Decreased activity tolerance, Decreased balance, Decreased coordination, Decreased endurance, Decreased knowledge of use of DME, Decreased mobility, Decreased range of motion, Decreased skin integrity, Decreased strength, Dizziness, Increased edema, Impaired flexibility, Postural dysfunction, Prosthetic Dependency  Visit Diagnosis: Muscle weakness (generalized)  Unsteadiness on feet  Other abnormalities of gait and mobility     Problem List Patient Active Problem List   Diagnosis Date Noted  . Hyponatremia   . Unilateral complete BKA, right, sequela ([place])   . Poorly controlled type 2 diabetes mellitus with peripheral neuropathy ([place])   . Hyperkalemia   . AKI (acute kidney injury) ([place])   . S/P below knee amputation, right ([place]) 09/03/2017  . Drug induced constipation   . Tobacco abuse   . Benign essential HTN   . Type 2 diabetes mellitus with peripheral neuropathy ([facility])   . Acute blood loss anemia   . Post-operative pain   . Subacute osteomyelitis, right ankle and foot ([place])   . Diabetic foot infection ([place])   . Sepsis ([place])   . Severe sepsis ([place]) 08/24/2017  . Essential hypertension 09/04/2016  . Poorly controlled type 1 diabetes mellitus with complication ([place]) 90/02/2003  . Diabetic gastroparesis associated with type 1 diabetes mellitus ([place]) 09/03/2016    Bjorn Loser, PTA  01/07/18, 10:53 AM [facility] [address] [place], [place], [address] Phone: [phone]   Fax:  [phone]  Name: Christopher Callahan MRN: [id number] Date of Birth: [birth year]

## 2018-01-13 ENCOUNTER — Ambulatory Visit: Payer: Federal, State, Local not specified - PPO | Admitting: Physical Therapy

## 2018-01-13 ENCOUNTER — Encounter: Payer: Self-pay | Admitting: Physical Therapy

## 2018-01-13 DIAGNOSIS — R293 Abnormal posture: Secondary | ICD-10-CM

## 2018-01-13 DIAGNOSIS — R2689 Other abnormalities of gait and mobility: Secondary | ICD-10-CM

## 2018-01-13 DIAGNOSIS — R2681 Unsteadiness on feet: Secondary | ICD-10-CM

## 2018-01-13 DIAGNOSIS — M6281 Muscle weakness (generalized): Secondary | ICD-10-CM | POA: Diagnosis not present

## 2018-01-14 NOTE — Therapy (Signed)
[facility] [address] [place], [place], [address] Phone: [phone]   Fax:  [phone]  Physical Therapy Treatment  Patient Details  Name: Christopher Callahan MRN: [id number] Date of Birth: [birth year] Referring Provider: Meridee Score, MD   Encounter Date: 01/13/2018  PT End of Session - 01/13/18 1102    Visit Number  13    Number of Visits  18    Date for PT Re-Evaluation  01/23/18    Authorization Type  [employer]  [id number], $4225.72 Met,  visit limit combined PT/OT/ST 50 with 0 used prior to eval.     PT Start Time  1020    PT Stop Time  1100    PT Time Calculation (min)  40 min    Equipment Utilized During Treatment  Gait belt    Activity Tolerance  Patient tolerated treatment well    Behavior During Therapy  [facility] for tasks assessed/performed       Past Medical History:  Diagnosis Date  . Diabetes mellitus without complication [facility]     Past Surgical History:  Procedure Laterality Date  . AMPUTATION Right 08/27/2017   Procedure: RIGHT BELOW KNEE AMPUTATION;  Surgeon: Newt Minion, MD;  Location: [facility];  Service: Orthopedics;  Laterality: Right;  . CHOLECYSTECTOMY    . SKIN GRAFT      There were no vitals filed for this visit.  Subjective Assessment - 01/13/18 1020    Subjective  He fell on wet floor in bathroom.  He scratched prosthesis but no injuries.     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  No/denies                       [facility] Adult PT Treatment/Exercise - 01/13/18 1015      Ambulation/Gait   Ambulation/Gait  Yes    Ambulation/Gait Assistance  5: Supervision    Ambulation/Gait Assistance Details  verbal cues on proper step width (not abducting), equal step  length & upright posture.     Ambulation Distance (Feet)  1000 Feet    Assistive device  None;Prosthesis    Gait Pattern  Step-through pattern;Decreased stride length;Decreased stance time - right;Decreased step length - left    Ambulation Surface  Indoor;Level    Stairs  Yes    Stairs Assistance  5: Supervision    Stairs Assistance Details (indicate cue type and reason)  demo & verbal cues on alternating pattern with TTA prosthesis;  technique with no rails available step-to quarter turn descending & progressed to carry light object in 2 hands    Stair Management Technique  Two rails;One rail Left;One rail Right;Alternating pattern;Forwards;No rails;Step to pattern    Number of Stairs  4   >10 reps total   Ramp  5: Supervision   prosthesis only   Ramp Details (indicate cue type and reason)  cues on proper step width (not abducting) & wt shifting directly over prosthesis    Curb  5: Supervision   prosthesis only   Curb Details (indicate cue type and reason)  leading with either LE & maintaining motion/fluency    Gait Comments  Treadmill: PT instructed in safety including getting on/off & start/stop/changing speed.  Pt ambulated 2.0 mph for 5 minutes  with cues on upright posture decreasing wt bearing thru UEs, proper step length & width.       High Level Balance   High Level Balance Activities  Figure 8 turns;Negotitating around obstacles;Negotiating over obstacles;Head turns;Turns;Direction changes    High Level Balance Comments  prosthesis only with tactile / verbal cues on pelvic control/orientation & balance      Therapeutic Activites    Work Simulation  climbing A-frame ladder with PT demo/verbal cues on proper technique with TTA prosthesis;  lifting & carrying 25# crate 50' 5 reps.       Knee/Hip Exercises: Aerobic   Stepper  --      Prosthetics   Prosthetic Care Comments   follow-up plan with prosthetist after PT discharges    Person(s) Educated  Patient    Education Method   Explanation;Verbal cues    Education Method  Verbalized understanding             PT Education - 01/13/18 1050    Education Details  diabetic foot care & proper shoe to control overpronation of LLE    Person(s) Educated  Patient    Methods  Explanation;Verbal cues    Comprehension  Verbalized understanding       PT Short Term Goals - 12/22/17 1019      PT SHORT TERM GOAL #1   Title  Patient verbalizes proper cleaning & skin care with prosthesis use. (All STGs Target Date 12/23/2017)    Baseline  12/18/17: met except for with new/unfamiliar concepts    Time  1    Period  Months    Status  Achieved      PT SHORT TERM GOAL #2   Title  Patient tolerates prosthesis wear >8 hrs /day without skin issues.     Baseline  12/18/17: met today    Time  1    Period  Months    Status  Achieved      PT SHORT TERM GOAL #3   Title  Patient ambulates 300' with cane & prosthesis with supervision.     Baseline  12/18/17: met today with no device    Time  1    Period  Months    Status  Achieved      PT SHORT TERM GOAL #4   Title  Patient negotiates ramps, curbs & stairs 1 rail with cane & prosthesis with supervision.     Baseline  12/18/17: met today with no AD, reciprocal pattern on stairs does require 2 rails, step to pattern with single rail    Time  1    Period  Months    Status  Achieved      PT SHORT TERM GOAL #5   Title  Patient reaches 10" and picks up objects from floor with prosthesis no UE support with supervision.     Baseline  12/22/17: met today with no UE support    Time  1    Period  Months    Status  Achieved        PT Long Term Goals - 12/24/17 1207      PT LONG TERM GOAL #1   Title  Patient verbalizes & demonstrates understanding of proper prosthetic care including problem solving new issues & when to see prosthetist to enable safe use of prosthesis. (All LTGs Target Date: 01/23/2018)    Status  Revised    Target Date  01/23/18      PT LONG TERM GOAL #2  Title  Patient tolerates prosthesis wear >90% of awake hours without skin or limb pain issues to enable function during his day.     Status  On-going    Target Date  01/23/18      PT LONG TERM GOAL #3   Title  Patient ambulates 2500' indoors/outdoors including grass, ramps, curbs & stairs 1 rail with prosthesis only independently.    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     Status  New    Target Date  01/23/18      PT LONG TERM GOAL #6   Title  Patient verbalizes & demonstrates understanding of ongoing fitness plan including exercises for gym.     Status  New    Target Date  01/23/18            Plan - 01/13/18 1817    Clinical Impression Statement  Today's skilled session focused on prosthetic gait with proper step width including ramps, curbs, stairs & negotiating obstacles. Treadmill used to improve fluency & pace of gait.     Rehab Potential  Good    Clinical Impairments Affecting Rehab Potential  living with mother while recovering,     PT Frequency  2x / week    PT Duration  Other (comment)    PT Treatment/Interventions  ADLs/Self Care Home Management;Canalith Repostioning;DME Instruction;Gait training;Stair training;Functional mobility training;Therapeutic activities;Therapeutic exercise;Balance training;Neuromuscular re-education;Patient/family education;Prosthetic Training;Vestibular    PT Next Visit Plan  work towards LTGs with discharge anticipated next week at end of [place]       Patient will benefit from skilled therapeutic intervention in order to improve the following deficits and impairments:  Abnormal gait, Decreased activity tolerance, Decreased balance, Decreased coordination,  Decreased endurance, Decreased knowledge of use of DME, Decreased mobility, Decreased range of motion, Decreased skin integrity, Decreased strength, Dizziness, Increased edema, Impaired flexibility, Postural dysfunction, Prosthetic Dependency  Visit Diagnosis: Muscle weakness (generalized)  Unsteadiness on feet  Other abnormalities of gait and mobility  Abnormal posture     Problem List Patient Active Problem List   Diagnosis Date Noted  . Hyponatremia   . Unilateral complete BKA, right, sequela ([place])   . Poorly controlled type 2 diabetes mellitus with peripheral neuropathy ([place])   . Hyperkalemia   . AKI (acute kidney injury) ([place])   . S/P below knee amputation, right ([place]) 09/03/2017  . Drug induced constipation   . Tobacco abuse   . Benign essential HTN   . Type 2 diabetes mellitus with peripheral neuropathy ([facility])   . Acute blood loss anemia   . Post-operative pain   . Subacute osteomyelitis, right ankle and foot ([place])   . Diabetic foot infection ([place])   . Sepsis ([place])   . Severe sepsis ([place]) 08/24/2017  . Essential hypertension 09/04/2016  . Poorly controlled type 1 diabetes mellitus with complication ([place]) 56/43/3295  . Diabetic gastroparesis associated with type 1 diabetes mellitus ([place]) 09/03/2016    Christopher Callahan  PT, DPT 01/14/2018, 8:22 AM  [facility] [address] [place], [place], [address] Phone: [phone]   Fax:  [phone]  Name: Christopher Callahan MRN: [id number] Date of Birth: 07-Jan-1991

## 2018-01-15 ENCOUNTER — Encounter: Payer: Self-pay | Admitting: Physical Therapy

## 2018-01-15 ENCOUNTER — Ambulatory Visit: Payer: Federal, State, Local not specified - PPO | Admitting: Physical Therapy

## 2018-01-15 DIAGNOSIS — M6281 Muscle weakness (generalized): Secondary | ICD-10-CM

## 2018-01-15 DIAGNOSIS — R2681 Unsteadiness on feet: Secondary | ICD-10-CM

## 2018-01-15 DIAGNOSIS — R2689 Other abnormalities of gait and mobility: Secondary | ICD-10-CM

## 2018-01-15 DIAGNOSIS — R293 Abnormal posture: Secondary | ICD-10-CM

## 2018-01-16 NOTE — Therapy (Signed)
[facility] [address] [place], [place], [address] Phone: [phone]   Fax:  [phone]  Physical Therapy Treatment  Patient Details  Name: Christopher Callahan MRN: [id number] Date of Birth: December 15, 1990 Referring Provider: Meridee Score, MD   Encounter Date: 01/15/2018  PT End of Session - 01/15/18 1000    Visit Number  14    Number of Visits  18    Date for PT Re-Evaluation  01/23/18    Authorization Type  [employer]  [id number], $4225.72 Met,  visit limit combined PT/OT/ST 50 with 0 used prior to eval.     PT Start Time  0757    PT Stop Time  0835    PT Time Calculation (min)  38 min    Equipment Utilized During Treatment  Gait belt    Activity Tolerance  Patient tolerated treatment well    Behavior During Therapy  [facility] for tasks assessed/performed       Past Medical History:  Diagnosis Date  . Diabetes mellitus without complication [facility])     Past Surgical History:  Procedure Laterality Date  . AMPUTATION Right 08/27/2017   Procedure: RIGHT BELOW KNEE AMPUTATION;  Surgeon: Newt Minion, MD;  Location: [facility];  Service: Orthopedics;  Laterality: Right;  . CHOLECYSTECTOMY    . SKIN GRAFT      There were no vitals filed for this visit.  Subjective Assessment - 01/15/18 0800    Subjective  He feels more comfortable with work tasks.     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  No/denies         [facility] PT Assessment - 01/15/18 0757      Ambulation/Gait   Ambulation/Gait  Yes    Ambulation/Gait Assistance  7: Independent    Ambulation Distance (Feet)  2500 Feet    Assistive device  None;Prosthesis    Gait Pattern  Within Functional Limits    Ambulation Surface   Level;Unlevel;Indoor;Outdoor;Paved;Gravel;Grass;Other (comment)   grass slope   Gait velocity  3.97 ft/sec comfortable, 4.88 ft/sec fast    Stairs  Yes    Stairs Assistance  6: Modified independent (Device/Increase time)    Stair Management Technique  No rails;One rail Right;One rail Left;Alternating pattern;Forwards;Other (comment)   carrying light object 2 hands   Number of Stairs  4   5 reps   Ramp  7: Independent;6: Modified independent (Device)   prosthesis only, modified technique steeper inclines   Curb  7: Independent   prosthesis only   Curb Details (indicate cue type and reason)  able to lead with either LE      Functional Gait  Assessment   Gait assessed   Yes    Gait Level Surface  Walks 20 ft in less than 5.5 sec, no assistive devices, good speed, no evidence for imbalance, normal gait pattern, deviates no more than 6 in outside of the 12 in walkway width.    Change in Gait Speed  Able to smoothly change walking speed without loss of balance or gait deviation. Deviate no more than 6 in outside of the 12 in walkway width.    Gait with Horizontal Head Turns  Performs head turns smoothly with no change in gait. Deviates no more than 6 in outside  12 in walkway width    Gait with Vertical Head Turns  Performs head turns with no change in gait. Deviates no more than 6 in outside 12 in walkway width.    Gait and Pivot Turn  Pivot turns safely within 3 sec and stops quickly with no loss of balance.    Step Over Obstacle  Is able to step over 2 stacked shoe boxes taped together (9 in total height) without changing gait speed. No evidence of imbalance.    Gait with Narrow Base of Support  Ambulates 4-7 steps.    Gait with Eyes Closed  Walks 20 ft, uses assistive device, slower speed, mild gait deviations, deviates 6-10 in outside 12 in walkway width. Ambulates 20 ft in less than 9 sec but greater than 7 sec.    Ambulating Backwards  Walks 20 ft, no assistive devices, good speed, no  evidence for imbalance, normal gait    Steps  Alternating feet, no rail.    Total Score  27      Prosthetics Assessment - 01/15/18 0800      Prosthetics   Prosthetic Care Independent with  Skin check;Residual limb care;Care of non-amputated limb;Prosthetic cleaning;Ply sock cleaning;Correct ply sock adjustment;Proper wear schedule/adjustment;Proper weight-bearing schedule/adjustment    Donning prosthesis   Independent    Doffing prosthesis   Independent    Current prosthetic wear tolerance (days/week)   daily    Current prosthetic wear tolerance (#hours/day)   all awake hours, drying as needed    Current prosthetic weight-bearing tolerance (hours/day)   tolerates standing 30 minutes during PT session but reports standing at home for >2 hours performing ADLs     Edema  none    Residual limb condition   no open issues, normal color, temperature & moisture    K code/activity level with prosthetic use   K3 full community level with variable cadence                  [facility] Adult PT Treatment/Exercise - 01/15/18 0800      Transfers   Floor to Transfer  6: Modified independent (Device/Increase time);With upper extremity assist   pushing on mat table   Floor to Transfer Details (indicate cue type and reason)  PT demo technique & pt return demo safely      Therapeutic Activites    Work Simulation  Carrying tray of glasses around obstacles, lifting 25# crate & carrying 50' 10 reps modified independent.               PT Short Term Goals - 12/22/17 1019      PT SHORT TERM GOAL #1   Title  Patient verbalizes proper cleaning & skin care with prosthesis use. (All STGs Target Date 12/23/2017)    Baseline  12/18/17: met except for with new/unfamiliar concepts    Time  1    Period  Months    Status  Achieved      PT SHORT TERM GOAL #2   Title  Patient tolerates prosthesis wear >8 hrs /day without skin issues.     Baseline  12/18/17: met today    Time  1    Period  Months     Status  Achieved      PT SHORT TERM GOAL #3   Title  Patient ambulates 300' with cane & prosthesis with supervision.     Baseline  12/18/17: met today with no device    Time  1  Period  Months    Status  Achieved      PT SHORT TERM GOAL #4   Title  Patient negotiates ramps, curbs & stairs 1 rail with cane & prosthesis with supervision.     Baseline  12/18/17: met today with no AD, reciprocal pattern on stairs does require 2 rails, step to pattern with single rail    Time  1    Period  Months    Status  Achieved      PT SHORT TERM GOAL #5   Title  Patient reaches 10" and picks up objects from floor with prosthesis no UE support with supervision.     Baseline  12/22/17: met today with no UE support    Time  1    Period  Months    Status  Achieved        PT Long Term Goals - 01/15/18 1001      PT LONG TERM GOAL #1   Title  Patient verbalizes & demonstrates understanding of proper prosthetic care including problem solving new issues & when to see prosthetist to enable safe use of prosthesis. (All LTGs Target Date: 01/23/2018)    Baseline  MET 01/15/2018    Status  Achieved      PT LONG TERM GOAL #2   Title  Patient tolerates prosthesis wear >90% of awake hours without skin or limb pain issues to enable function during his day.     Baseline  MET 01/15/2018    Status  Achieved      PT LONG TERM GOAL #3   Title  Patient ambulates 2500' indoors/outdoors including grass, ramps, curbs & stairs 1 rail with prosthesis only independently.    Baseline  MET 01/15/2018    Status  Achieved      PT LONG TERM GOAL #4   Title  Functional Gait Assessment >/= 24/30    Baseline  MET 01/15/2018  FGA 27/30    Status  Achieved      PT LONG TERM GOAL #5   Title  Patient performs work simulation tasks (bartending) including lifting & carrying tray with glasses with prosthesis only independently.     Baseline  MET 01/15/2018    Status  Achieved      PT LONG TERM GOAL #6   Title  Patient verbalizes  & demonstrates understanding of ongoing fitness plan including exercises for gym.     Baseline  MET 01/15/2018    Status  Achieved            Plan - 01/15/18 1002    Clinical Impression Statement  Patient met all LTGs and appears to be functioning at community level with Transtibial Amputation. He demonstrates ability to perform work tasks lifting, carrying, floor transfers, climbing ladder & carrying trays of drinks. His job is bartending.      Rehab Potential  Good    Clinical Impairments Affecting Rehab Potential  living with mother while recovering,     PT Frequency  2x / week    PT Duration  Other (comment)    PT Treatment/Interventions  ADLs/Self Care Home Management;Canalith Repostioning;DME Instruction;Gait training;Stair training;Functional mobility training;Therapeutic activities;Therapeutic exercise;Balance training;Neuromuscular re-education;Patient/family education;Prosthetic Training;Vestibular    PT Next Visit Plan  discharge    Consulted and Agree with Plan of Care  Patient       Patient will benefit from skilled therapeutic intervention in order to improve the following deficits and impairments:  Abnormal gait, Decreased activity tolerance, Decreased balance, Decreased  coordination, Decreased endurance, Decreased knowledge of use of DME, Decreased mobility, Decreased range of motion, Decreased skin integrity, Decreased strength, Dizziness, Increased edema, Impaired flexibility, Postural dysfunction, Prosthetic Dependency  Visit Diagnosis: Muscle weakness (generalized)  Unsteadiness on feet  Other abnormalities of gait and mobility  Abnormal posture     Problem List Patient Active Problem List   Diagnosis Date Noted  . Hyponatremia   . Unilateral complete BKA, right, sequela ([facility])   . Poorly controlled type 2 diabetes mellitus with peripheral neuropathy ([facility])   . Hyperkalemia   . AKI (acute kidney injury) ([facility])   . S/P below knee amputation, right ([place])  09/03/2017  . Drug induced constipation   . Tobacco abuse   . Benign essential HTN   . Type 2 diabetes mellitus with peripheral neuropathy ([facility])   . Acute blood loss anemia   . Post-operative pain   . Subacute osteomyelitis, right ankle and foot ([facility])   . Diabetic foot infection ([facility])   . Sepsis ([facility])   . Severe sepsis ([facility]) 08/24/2017  . Essential hypertension 09/04/2016  . Poorly controlled type 1 diabetes mellitus with complication ([facility]) 77/82/4235  . Diabetic gastroparesis associated with type 1 diabetes mellitus ([facility]) 09/03/2016   PHYSICAL THERAPY DISCHARGE SUMMARY  Visits from Start of Care: 14  Current functional level related to goals / functional outcomes: PT Long Term Goals - 01/15/18 1001      PT LONG TERM GOAL #1   Title  Patient verbalizes & demonstrates understanding of proper prosthetic care including problem solving new issues & when to see prosthetist to enable safe use of prosthesis. (All LTGs Target Date: 01/23/2018)    Baseline  MET 01/15/2018    Status  Achieved      PT LONG TERM GOAL #2   Title  Patient tolerates prosthesis wear >90% of awake hours without skin or limb pain issues to enable function during his day.     Baseline  MET 01/15/2018    Status  Achieved      PT LONG TERM GOAL #3   Title  Patient ambulates 2500' indoors/outdoors including grass, ramps, curbs & stairs 1 rail with prosthesis only independently.    Baseline  MET 01/15/2018    Status  Achieved      PT LONG TERM GOAL #4   Title  Functional Gait Assessment >/= 24/30    Baseline  MET 01/15/2018  FGA 27/30    Status  Achieved      PT LONG TERM GOAL #5   Title  Patient performs work simulation tasks (bartending) including lifting & carrying tray with glasses with prosthesis only independently.     Baseline  MET 01/15/2018    Status  Achieved      PT LONG TERM GOAL #6   Title  Patient verbalizes & demonstrates understanding of ongoing fitness plan including exercises for gym.      Baseline  MET 01/15/2018    Status  Achieved        Remaining deficits: See above   Education / Equipment: Prosthetic care, diabetic foot care & fitness program  Plan: Patient agrees to discharge.  Patient goals were met. Patient is being discharged due to meeting the stated rehab goals.  ?????         Josey Dettmann  PT, DPT 01/16/2018, 10:22 AM  [facility] [address] [place], [place], [address] Phone: [phone]   Fax:  [phone]  Name: Christopher Callahan MRN: [id number]  Date of Birth: 10-24-1990

## 2018-01-16 NOTE — Therapy (Deleted)
[facility] [address] [place], [place], [address] Phone: [phone]   Fax:  [phone]  Patient Details  Name: Trevor Macentonio Mcphail MRN: [id number] Date of Birth: August 05, 1990 Referring Provider:  Lewis Moccasinewey, Elizabeth R, MD  Encounter Date: 01/15/2018   Vladimir FasterWALDRON,Kalii Chesmore 01/16/2018, 10:18 AM  [facility] [address] [place], [place], [address] Phone: [phone]   Fax:  [phone]

## 2018-01-19 ENCOUNTER — Encounter: Payer: Federal, State, Local not specified - PPO | Admitting: Physical Therapy

## 2018-01-21 ENCOUNTER — Encounter: Payer: Federal, State, Local not specified - PPO | Admitting: Physical Therapy

## 2018-03-27 ENCOUNTER — Encounter (HOSPITAL_COMMUNITY): Payer: Self-pay | Admitting: Emergency Medicine

## 2018-03-27 ENCOUNTER — Emergency Department (HOSPITAL_COMMUNITY): Payer: Self-pay

## 2018-03-27 ENCOUNTER — Other Ambulatory Visit: Payer: Self-pay

## 2018-03-27 ENCOUNTER — Emergency Department (HOSPITAL_COMMUNITY)
Admission: EM | Admit: 2018-03-27 | Discharge: 2018-03-27 | Disposition: A | Discharge status: Home / Self Care | Payer: Self-pay | Attending: Emergency Medicine | Admitting: Emergency Medicine

## 2018-03-27 DIAGNOSIS — R112 Nausea with vomiting, unspecified: Secondary | ICD-10-CM

## 2018-03-27 DIAGNOSIS — Z794 Long term (current) use of insulin: Secondary | ICD-10-CM | POA: Insufficient documentation

## 2018-03-27 DIAGNOSIS — K3184 Gastroparesis: Secondary | ICD-10-CM

## 2018-03-27 DIAGNOSIS — E109 Type 1 diabetes mellitus without complications: Secondary | ICD-10-CM | POA: Insufficient documentation

## 2018-03-27 DIAGNOSIS — Z79899 Other long term (current) drug therapy: Secondary | ICD-10-CM | POA: Insufficient documentation

## 2018-03-27 DIAGNOSIS — R079 Chest pain, unspecified: Secondary | ICD-10-CM

## 2018-03-27 DIAGNOSIS — F17210 Nicotine dependence, cigarettes, uncomplicated: Secondary | ICD-10-CM | POA: Insufficient documentation

## 2018-03-27 HISTORY — DX: Gastroparesis: K31.84

## 2018-03-27 LAB — I-STAT VENOUS BLOOD GAS, ED
Bicarbonate: 25.4 mmol/L (ref 20.0–28.0)
O2 Saturation: 53 %
Patient temperature: 99
TCO2: 27 mmol/L (ref 22–32)
pCO2, Ven: 44.4 mmHg (ref 44.0–60.0)
pH, Ven: 7.367 (ref 7.250–7.430)
pO2, Ven: 30 mmHg — CL (ref 32.0–45.0)

## 2018-03-27 LAB — URINALYSIS, ROUTINE W REFLEX MICROSCOPIC
Bilirubin Urine: NEGATIVE
Glucose, UA: >=500 mg/dL — AB
Ketones, ur: NEGATIVE mg/dL
Leukocytes, UA: NEGATIVE
Nitrite: NEGATIVE
Protein, ur: 100 mg/dL — AB
Specific Gravity, Urine: 1.033 — ABNORMAL HIGH (ref 1.005–1.030)
pH: 5 (ref 5.0–8.0)

## 2018-03-27 LAB — CBC
HCT: 45.7 % (ref 39.0–52.0)
Hemoglobin: 14.4 g/dL (ref 13.0–17.0)
MCH: 25.2 pg — ABNORMAL LOW (ref 26.0–34.0)
MCHC: 31.5 g/dL (ref 30.0–36.0)
MCV: 80 fL (ref 80.0–100.0)
Platelets: 327 10*3/uL (ref 150–400)
RBC: 5.71 MIL/uL (ref 4.22–5.81)
RDW: 13.6 % (ref 11.5–15.5)
WBC: 8.5 10*3/uL (ref 4.0–10.5)
nRBC: 0 % (ref 0.0–0.2)

## 2018-03-27 LAB — COMPREHENSIVE METABOLIC PANEL
ALT: 21 U/L (ref 0–44)
AST: 18 U/L (ref 15–41)
Albumin: 4 g/dL (ref 3.5–5.0)
Alkaline Phosphatase: 82 U/L (ref 38–126)
Anion gap: 15 (ref 5–15)
BUN: 17 mg/dL (ref 6–20)
CO2: 25 mmol/L (ref 22–32)
Calcium: 10.2 mg/dL (ref 8.9–10.3)
Chloride: 96 mmol/L — ABNORMAL LOW (ref 98–111)
Creatinine, Ser: 1.06 mg/dL (ref 0.61–1.24)
GFR calc Af Amer: >60 mL/min (ref >60)
GFR calc non Af Amer: >60 mL/min (ref >60)
Glucose, Bld: 371 mg/dL — ABNORMAL HIGH (ref 70–99)
Potassium: 4.7 mmol/L (ref 3.5–5.1)
Sodium: 136 mmol/L (ref 135–145)
Total Bilirubin: 0.6 mg/dL (ref 0.3–1.2)
Total Protein: 7.7 g/dL (ref 6.5–8.1)

## 2018-03-27 LAB — LIPASE, BLOOD: Lipase: 29 U/L (ref 11–51)

## 2018-03-27 MED ORDER — SODIUM CHLORIDE 0.9 % IV BOLUS
1000.0000 mL | Freq: Once | INTRAVENOUS | Status: AC
  Administered 2018-03-27: 1000 mL via INTRAVENOUS

## 2018-03-27 MED ORDER — HALOPERIDOL LACTATE 5 MG/ML IJ SOLN
2.0000 mg | Freq: Once | INTRAMUSCULAR | Status: AC
  Administered 2018-03-27: 2 mg via INTRAVENOUS
  Filled 2018-03-27: qty 1

## 2018-03-27 MED ORDER — MORPHINE SULFATE (PF) 4 MG/ML IV SOLN
4.0000 mg | INTRAVENOUS | Status: AC
  Administered 2018-03-27: 4 mg via INTRAVENOUS
  Filled 2018-03-27: qty 1

## 2018-03-27 MED ORDER — PROMETHAZINE HCL 25 MG/ML IJ SOLN
25.0000 mg | Freq: Once | INTRAMUSCULAR | Status: AC
  Administered 2018-03-27: 25 mg via INTRAVENOUS
  Filled 2018-03-27: qty 1

## 2018-03-27 MED ORDER — LISINOPRIL 10 MG PO TABS
5.0000 mg | ORAL_TABLET | Freq: Once | ORAL | Status: AC
  Administered 2018-03-27: 5 mg via ORAL
  Filled 2018-03-27: qty 1

## 2018-03-27 NOTE — ED Triage Notes (Signed)
Pt presents to ED for assessment of 2 days of emesis and now starting chest pain this morning.  Hx of gastroparesis.  Patient's mother states he is more diaphoretic than normal.

## 2018-03-27 NOTE — ED Provider Notes (Signed)
[facility] EMERGENCY DEPARTMENT Provider Note   CSN: [id number] Arrival date & time: 03/27/18  1859     History   Chief Complaint Chief Complaint  Patient presents with  . Emesis  . Chest Pain    HPI Christopher Callahan is a 27 y.o. male.  HPI  She is a 27 year old male with a past medical history of diabetes and gastroparesis who presents to the emergency department for evaluation of 2 days of nausea that is now progressed to abdominal pain and chest pain.  Patient reports that yesterday upon awakening he felt more nauseous than usual.  Has had multiple episodes of vomiting since that time.  Patient reports he has had difficulty keeping down food or water since the onset of his symptoms.  Reports that throughout the day today he is noticed worsening abdominal pain and now is endorsing chest pain.  States that the abdominal pain is worse with retching or palpation over the area.  Describes it as dull as achy in nature.  Patient's chest and go at random intervals.  Does not associate with his nausea or vomiting.  States it is not exertional.  He has had intermittent diaphoresis, but this is been present with and without chest pain.  He denies any fevers but has had intermittent chills.  Denies any urinary symptoms.  States that he has had constipation earlier this week but had a normal bowel movement this morning.  Remaining review of systems is negative at this time.  Past Medical History:  Diagnosis Date  . Diabetes mellitus without complication (HCC)   . Gastroparesis     Patient Active Problem List   Diagnosis Date Noted  . Hyponatremia   . Unilateral complete BKA, right, sequela (HCC)   . Poorly controlled type 2 diabetes mellitus with peripheral neuropathy (HCC)   . Hyperkalemia   . AKI (acute kidney injury) (HCC)   . S/P below knee amputation, right (HCC) 09/03/2017  . Drug induced constipation   . Tobacco abuse   . Benign essential HTN   . Type 2  diabetes mellitus with peripheral neuropathy (HCC)   . Acute blood loss anemia   . Post-operative pain   . Subacute osteomyelitis, right ankle and foot (HCC)   . Diabetic foot infection (HCC)   . Sepsis (HCC)   . Severe sepsis (HCC) 08/24/2017  . Essential hypertension 09/04/2016  . Poorly controlled type 1 diabetes mellitus with complication (HCC) 09/03/2016  . Diabetic gastroparesis associated with type 1 diabetes mellitus (HCC) 09/03/2016    Past Surgical History:  Procedure Laterality Date  . AMPUTATION Right 08/27/2017   Procedure: RIGHT BELOW KNEE AMPUTATION;  Surgeon: Nadara Mustard, MD;  Location: [facility] OR;  Service: Orthopedics;  Laterality: Right;  . CHOLECYSTECTOMY    . SKIN GRAFT          Home Medications    Prior to Admission medications   Medication Sig Start Date End Date Taking? Authorizing Provider  insulin glargine (LANTUS) 100 unit/mL SOPN Inject 24 Units into the skin every morning.   Yes [provider]  insulin lispro (HUMALOG) 100 UNIT/ML injection Inject 10 Units into the skin 3 (three) times daily before meals.   Yes [provider]  lisinopril (PRINIVIL,ZESTRIL) 5 MG tablet Take 5 mg by mouth daily.   Yes [provider]  amLODipine (NORVASC) 5 MG tablet Take 1 tablet (5 mg total) by mouth daily. Patient not taking: Reported on 03/27/2018 09/11/17   Mariam Dollar  J, PA-C  bisacodyl (DULCOLAX) 5 MG EC tablet Take 1 tablet (5 mg total) by mouth daily. Patient not taking: Reported on 03/27/2018 09/11/17   Angiulli, Mcarthur Rossetti, PA-C  insulin aspart (NOVOLOG FLEXPEN) 100 UNIT/ML FlexPen Inject 10 Units into the skin 3 (three) times daily with meals. Patient not taking: Reported on 03/27/2018 09/11/17   Angiulli, Mcarthur Rossetti, PA-C  Insulin Detemir (LEVEMIR FLEXTOUCH) 100 UNIT/ML Pen Inject 24 Units into the skin 2 (two) times daily. Patient not taking: Reported on 03/27/2018 09/11/17   Angiulli, Mcarthur Rossetti, PA-C  methocarbamol (ROBAXIN) 500 MG  tablet Take 1 tablet (500 mg total) by mouth every 6 (six) hours as needed for muscle spasms. Patient not taking: Reported on 03/27/2018 09/11/17   Angiulli, Mcarthur Rossetti, PA-C  metoCLOPramide (REGLAN) 10 MG tablet Take 1 tablet (10 mg total) by mouth every 6 (six) hours as needed for up to 7 days for nausea or vomiting. Patient not taking: Reported on 03/27/2018 10/07/17 10/14/17  Kellie Shropshire, PA-C  oxyCODONE (OXY IR/ROXICODONE) 5 MG immediate release tablet Take 1-2 tablets (5-10 mg total) by mouth every 4 (four) hours as needed for moderate pain (pain score 4-6). Patient not taking: Reported on 03/27/2018 09/11/17   Angiulli, Mcarthur Rossetti, PA-C  polyethylene glycol [facility] / GLYCOLAX) packet Take 17 g by mouth daily as needed for mild constipation. Patient not taking: Reported on 03/27/2018 08/29/17   Glade Lloyd, MD  promethazine (PHENERGAN) 25 MG tablet Take 1 tablet (25 mg total) by mouth every 6 (six) hours as needed for nausea or vomiting. Patient not taking: Reported on 03/27/2018 10/07/17   Kellie Shropshire, PA-C    Family History Family History  Problem Relation Age of Onset  . COPD Father     Social History Social History   Tobacco Use  . Smoking status: Current Every Day Smoker    Packs/day: 0.30    Types: Cigarettes  . Smokeless tobacco: Never Used  Substance Use Topics  . Alcohol use: No  . Drug use: No     Allergies   Patient has no known allergies.   Review of Systems Review of Systems  Constitutional: Negative for chills and fever.  HENT: Negative for ear pain and sore throat.   Eyes: Negative for pain and visual disturbance.  Respiratory: Negative for cough and shortness of breath.   Cardiovascular: Positive for chest pain. Negative for palpitations.  Gastrointestinal: Positive for abdominal pain, nausea and vomiting. Negative for constipation and diarrhea.  Genitourinary: Negative for dysuria and hematuria.  Musculoskeletal: Negative for arthralgias and  back pain.  Skin: Negative for color change and rash.  Neurological: Negative for seizures and syncope.  Psychiatric/Behavioral: Negative for agitation, behavioral problems and confusion.  All other systems reviewed and are negative.    Physical Exam Updated Vital Signs BP (!) 149/91   Pulse 93   Temp 99 F (37.2 C) (Oral)   Resp 16   SpO2 98%   Physical Exam  Constitutional: He appears well-developed and well-nourished.  Does appear to be somewhat uncomfortable on exam.  HENT:  Head: Normocephalic and atraumatic.  Eyes: Conjunctivae are normal.  Neck: Neck supple.  Cardiovascular: Normal rate and regular rhythm.  Patient was initially tachycardic but this resolved with fluid administration.   Pulmonary/Chest: Effort normal and breath sounds normal. No respiratory distress.  Abdominal: Soft. He exhibits no distension. There is tenderness (Diffuse). There is no guarding.  Musculoskeletal: He exhibits no edema.  Prosthetic limb on RLE.  Neurological: He is alert.  Skin: Skin is warm and dry.  Psychiatric: He has a normal mood and affect.  Nursing note and vitals reviewed.    ED Treatments / Results  Labs (all labs ordered are listed, but only abnormal results are displayed) Labs Reviewed  COMPREHENSIVE METABOLIC PANEL - Abnormal; Notable for the following components:      Result Value   Chloride 96 (*)    Glucose, Bld 371 (*)    All other components within normal limits  CBC - Abnormal; Notable for the following components:   MCH 25.2 (*)    All other components within normal limits  URINALYSIS, ROUTINE W REFLEX MICROSCOPIC - Abnormal; Notable for the following components:   Specific Gravity, Urine 1.033 (*)    Glucose, UA >=500 (*)    Hgb urine dipstick SMALL (*)    Protein, ur 100 (*)    Bacteria, UA RARE (*)    All other components within normal limits  I-STAT VENOUS BLOOD GAS, ED - Abnormal; Notable for the following components:   pO2, Ven 30.0 (*)    All  other components within normal limits  LIPASE, BLOOD  I-STAT TROPONIN, ED    EKG None  Radiology Dg Chest 2 View  Result Date: 03/27/2018 CLINICAL DATA:  Chest pain and emesis EXAM: CHEST - 2 VIEW COMPARISON:  09/03/2016 FINDINGS: The heart size and mediastinal contours are within normal limits. Both lungs are clear. The visualized skeletal structures are unremarkable. IMPRESSION: No active cardiopulmonary disease. Electronically Signed   By: Deatra Robinson M.D.   On: 03/27/2018 20:46    Procedures Procedures (including critical care time)  Medications Ordered in ED Medications  promethazine (PHENERGAN) injection 25 mg (25 mg Intravenous Given 03/27/18 2017)  sodium chloride 0.9 % bolus 1,000 mL (0 mLs Intravenous Stopped 03/27/18 2118)  morphine 4 MG/ML injection 4 mg (4 mg Intravenous Given 03/27/18 2201)  lisinopril (PRINIVIL,ZESTRIL) tablet 5 mg (5 mg Oral Given 03/27/18 2256)  sodium chloride 0.9 % bolus 1,000 mL (0 mLs Intravenous Stopped 03/27/18 2326)  haloperidol lactate (HALDOL) injection 2 mg (2 mg Intravenous Given 03/27/18 2255)     Initial Impression / Assessment and Plan / ED Course  I have reviewed the triage vital signs and the nursing notes.  Pertinent labs & imaging results that were available during my care of the patient were reviewed by me and considered in my medical decision making (see chart for details).     Patient is a 27 year old male with past medical history as detailed above who presents to the emergency department for evaluation of nausea, vomiting, and chest pain.  Patient's nausea and vomiting began yesterday.  Patient reports that his chest pain began earlier this afternoon.  It is nonexertional and not associated with his other symptoms.  Upon arrival, patient did appear somewhat diaphoretic and tachycardic.  As a result patient received intravenous fluids, and antiemetics. After these interventions patient had significant improvement to his  symptoms.  Given patient's history of diabetes and his presenting complaints multiple laboratory and imaging studies were obtained.  The abnormal results of the studies are as detailed above.  At this time, the exact etiology of patient's chest pain is unclear.  Patient does not appear to be suffering from ACS given his negative troponin and reassuring EKG.  PE unlikely given patient's lack of hypoxia and no pleuritic component to his chest pain.  Patient with no hematemesis or pneumomediastinum on chest x-ray to indicate esophageal perforation.  No pneumothorax or pneumonia on chest x-ray.  In regards to patient's nausea and vomiting, this is likely an acute exacerbation of patient's underlying gastroparesis.  As a result of patient's worsening gastroparesis and difficult to control blood sugar patient will follow up with his primary care physician on Monday for reevaluation.  Patient given strict return precautions should his symptoms acutely worsen or if any new, concerning symptoms arise.  Patient was feeling much better at the time of discharge and able to ambulate without difficulty.  The care of this patient was discussed with my attending physician Dr. Juleen China, who voices agreement with work-up and ED disposition.  Final Clinical Impressions(s) / ED Diagnoses   Final diagnoses:  Non-intractable vomiting with nausea, unspecified vomiting type  Gastroparesis  Chest pain, unspecified type    ED Discharge Orders    None       Deitra Craine, Winfield Rast, MD 03/28/18 1048    Raeford Razor, MD 04/02/18 1521

## 2018-03-27 NOTE — ED Provider Notes (Signed)
Patient placed in Quick Look pathway, seen and evaluated   Chief Complaint: emesis  HPI: Christopher Callahan is a 27 y.o. male who presents to the ED with nausea and vomiting that started 2 days ago and has gotten worse. Patient reports this morning began having chest pain. Hx of gastroparesis. Patient's family member reports that the patient has been more diaphoretic than usual  ROS: GI: n/v, abdominal pain  CV: chest pain  Physical Exam:  BP (!) 159/102 (BP Location: Right Arm)   Pulse (!) 110   Temp 99 F (37.2 C) (Oral)   Resp 18   SpO2 100%    Gen: No distress  Neuro: Awake and Alert  Skin: diaphoretic   Heart tachycardia  Lungs: no wheezing or rales heard  Abdomen: generalized tenderness, decreased BS     Initiation of care has begun. The patient has been counseled on the process, plan, and necessity for staying for the completion/evaluation, and the remainder of the medical screening examination    Janne Napoleon, NP 03/27/18 Margretta Ditty    Raeford Razor, MD 03/30/18 0001

## 2018-03-27 NOTE — ED Notes (Signed)
Patient verbalizes understanding of discharge instructions. Opportunity for questioning and answers were provided. Armband removed by staff, pt discharged from ED w/ family member.

## 2018-03-27 NOTE — ED Notes (Signed)
Dr Juleen China informed of venous blood gas results

## 2018-05-22 ENCOUNTER — Ambulatory Visit (INDEPENDENT_AMBULATORY_CARE_PROVIDER_SITE_OTHER): Payer: Self-pay | Admitting: Physician Assistant

## 2018-05-22 ENCOUNTER — Encounter (INDEPENDENT_AMBULATORY_CARE_PROVIDER_SITE_OTHER): Payer: Self-pay | Admitting: Physician Assistant

## 2018-05-22 VITALS — Ht 72.0 in | Wt 224.0 lb

## 2018-05-22 DIAGNOSIS — Z89511 Acquired absence of right leg below knee: Secondary | ICD-10-CM

## 2018-05-22 DIAGNOSIS — L98499 Non-pressure chronic ulcer of skin of other sites with unspecified severity: Secondary | ICD-10-CM

## 2018-05-22 DIAGNOSIS — E10622 Type 1 diabetes mellitus with other skin ulcer: Secondary | ICD-10-CM

## 2018-05-22 MED ORDER — SILVER SULFADIAZINE 1 % EX CREA: 1.0000 "application " | TOPICAL_CREAM | Freq: Every day | CUTANEOUS | 0 refills | Status: AC

## 2018-05-22 MED ORDER — DOXYCYCLINE HYCLATE 100 MG PO CAPS: 100.0000 mg | ORAL_CAPSULE | Freq: Two times a day (BID) | ORAL | 1 refills | Status: DC

## 2018-05-22 NOTE — Progress Notes (Signed)
Office Visit Note   [name]           Date of Birth: 11-15-1990           MRN: [id number] Visit Date: 05/22/2018              Requested by: Lewis Moccasinewey, Elizabeth R, MD [address] [place], [place] [address] PCP: Lewis Moccasinewey, Elizabeth R, MD  Chief Complaint  Patient presents with  . 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  He reports he has been doing well but developed an ulcer and he did stop wearing his prosthesis over the past few days.  He reports he noticed an area of friction blister about 2 weeks ago but then noticed more breakdown over the past week and started noticing some ulcer.  He reports that he works in [employer] and has to stand a great deal.  His prosthesis was made by [facility]  Assessment & Plan: Visit Diagnoses:  1. Acquired absence of right leg below knee (HCC)   2. Type 1 diabetes mellitus with other skin ulcer (HCC)     Plan: We will begin doxycycline 100 mg p.o. twice daily for the next 14 days and also have him start putting some Silvadene cream to the ulcerated area.  He should remain out of his prosthesis for at least the next 2 weeks.  He will follow-up here in 2 weeks.  I did give him a note to be out of work until he is reassessed on 06/02/2018.   Follow-Up Instructions: Return in about 11 days (around 06/02/2018).   Ortho Exam  Patient is alert, oriented, no adenopathy, well-dressed, normal affect, normal respiratory effort. The right transtibial amputation has a skin ulcer over the lateral residual limb.  It is 2.5 x 1.5 x 0.2 cm and has fat layer exposed.  There is no signs of cellulitis in the residual limb.  He has good knee extension and good flexion  Imaging: No results found. No images are attached to the  encounter.  Labs: Lab Results  Component Value Date   HGBA1C 11.1 (H) 08/25/2017   ESRSEDRATE 107 (H) 08/24/2017   CRP 27.6 (H) 08/27/2017   CRP 29.1 (H) 08/24/2017   REPTSTATUS 08/25/2017 FINAL 08/24/2017   CULT  08/24/2017    NO GROWTH Performed at [facility], [address] [place], [place] [address]      Lab Results  Component Value Date   ALBUMIN 4.0 03/27/2018   ALBUMIN 4.2 10/07/2017   ALBUMIN 2.5 (L) 09/04/2017    Body mass index is 30.38 kg/m.  Orders:  No orders of the defined types were placed in this encounter.  Meds ordered this encounter  Medications  . doxycycline (VIBRAMYCIN) 100 MG capsule    Sig: Take 1 capsule (100 mg total) by mouth 2 (two) times daily.    Dispense:  28 capsule    Refill:  1  . silver sulfADIAZINE (SILVADENE) 1 % cream    Sig: Apply 1 application topically daily.    Dispense:  50 g    Refill:  0     Procedures: No procedures performed  Clinical Data: No additional findings.  ROS:  All other systems negative, except  as noted in the HPI. Review of Systems  Objective: Vital Signs: Ht 6' (1.829 m)   Wt 224 lb (101.6 kg)   BMI 30.38 kg/m   Specialty Comments:  No specialty comments available.  PMFS History: Patient Active Problem List   Diagnosis Date Noted  . Hyponatremia   . Unilateral complete BKA, right, sequela (HCC)   . Poorly controlled type 2 diabetes mellitus with peripheral neuropathy (HCC)   . Hyperkalemia   . AKI (acute kidney injury) (HCC)   . S/P below knee amputation, right (HCC) 09/03/2017  . Drug induced constipation   . Tobacco abuse   . Benign essential HTN   . Type 2 diabetes mellitus with peripheral neuropathy (HCC)   . Acute blood loss anemia   . Post-operative pain   . Subacute osteomyelitis, right ankle and foot (HCC)   . Diabetic foot infection (HCC)   . Sepsis (HCC)   . Severe sepsis (HCC) 08/24/2017  . Essential hypertension 09/04/2016  . Poorly controlled type 1  diabetes mellitus with complication (HCC) 09/03/2016  . Diabetic gastroparesis associated with type 1 diabetes mellitus (HCC) 09/03/2016   Past Medical History:  Diagnosis Date  . Diabetes mellitus without complication (HCC)   . Gastroparesis     Family History  Problem Relation Age of Onset  . COPD Father     Past Surgical History:  Procedure Laterality Date  . AMPUTATION Right 08/27/2017   Procedure: RIGHT BELOW KNEE AMPUTATION;  Surgeon: Nadara Mustarduda, Marcus V, MD;  [facility];  Service: Orthopedics;  Laterality: Right;  . CHOLECYSTECTOMY    . SKIN GRAFT     Social History   Occupational History  . Not on file  Tobacco Use  . Smoking status: Current Every Day Smoker    Packs/day: 0.30    Types: Cigarettes  . Smokeless tobacco: Never Used  Substance and Sexual Activity  . Alcohol use: No  . Drug use: No  . Sexual activity: Not on file

## 2018-06-02 ENCOUNTER — Ambulatory Visit (INDEPENDENT_AMBULATORY_CARE_PROVIDER_SITE_OTHER): Payer: Self-pay | Admitting: Physician Assistant

## 2018-06-03 ENCOUNTER — Emergency Department (HOSPITAL_COMMUNITY)
Admission: EM | Admit: 2018-06-03 | Discharge: 2018-06-03 | Disposition: A | Discharge status: Home / Self Care | Payer: Self-pay | Attending: Emergency Medicine | Admitting: Emergency Medicine

## 2018-06-03 ENCOUNTER — Other Ambulatory Visit: Payer: Self-pay

## 2018-06-03 DIAGNOSIS — R112 Nausea with vomiting, unspecified: Secondary | ICD-10-CM | POA: Insufficient documentation

## 2018-06-03 DIAGNOSIS — R1084 Generalized abdominal pain: Secondary | ICD-10-CM | POA: Insufficient documentation

## 2018-06-03 DIAGNOSIS — E1165 Type 2 diabetes mellitus with hyperglycemia: Secondary | ICD-10-CM | POA: Insufficient documentation

## 2018-06-03 DIAGNOSIS — E871 Hypo-osmolality and hyponatremia: Secondary | ICD-10-CM | POA: Insufficient documentation

## 2018-06-03 DIAGNOSIS — Z79899 Other long term (current) drug therapy: Secondary | ICD-10-CM | POA: Insufficient documentation

## 2018-06-03 DIAGNOSIS — Z794 Long term (current) use of insulin: Secondary | ICD-10-CM | POA: Insufficient documentation

## 2018-06-03 DIAGNOSIS — F17210 Nicotine dependence, cigarettes, uncomplicated: Secondary | ICD-10-CM | POA: Insufficient documentation

## 2018-06-03 DIAGNOSIS — R739 Hyperglycemia, unspecified: Secondary | ICD-10-CM

## 2018-06-03 LAB — I-STAT CHEM 8, ED
BUN: 22 mg/dL — ABNORMAL HIGH (ref 6–20)
Calcium, Ion: 1.13 mmol/L — ABNORMAL LOW (ref 1.15–1.40)
Chloride: 92 mmol/L — ABNORMAL LOW (ref 98–111)
Creatinine, Ser: 0.9 mg/dL (ref 0.61–1.24)
Glucose, Bld: 302 mg/dL — ABNORMAL HIGH (ref 70–99)
HEMATOCRIT: 49 % (ref 39.0–52.0)
HEMOGLOBIN: 16.7 g/dL (ref 13.0–17.0)
Potassium: 4.9 mmol/L (ref 3.5–5.1)
SODIUM: 125 mmol/L — AB (ref 135–145)
TCO2: 24 mmol/L (ref 22–32)

## 2018-06-03 MED ORDER — HYDROMORPHONE HCL 1 MG/ML IJ SOLN
1.0000 mg | Freq: Once | INTRAMUSCULAR | Status: AC
  Administered 2018-06-03: 1 mg via INTRAVENOUS
  Filled 2018-06-03: qty 1

## 2018-06-03 MED ORDER — LACTATED RINGERS IV BOLUS
1000.0000 mL | Freq: Once | INTRAVENOUS | Status: AC
  Administered 2018-06-03: 1000 mL via INTRAVENOUS

## 2018-06-03 MED ORDER — PROMETHAZINE HCL 25 MG RE SUPP: 25.0000 mg | Freq: Four times a day (QID) | RECTAL | 0 refills | Status: AC | PRN

## 2018-06-03 MED ORDER — PROMETHAZINE HCL 25 MG/ML IJ SOLN
25.0000 mg | Freq: Once | INTRAMUSCULAR | Status: AC
  Administered 2018-06-03: 25 mg via INTRAVENOUS
  Filled 2018-06-03: qty 1

## 2018-06-03 NOTE — ED Triage Notes (Signed)
Pt here from home with c/o abd oain and nausea , has been off and on for a while

## 2018-06-03 NOTE — ED Notes (Signed)
Patient verbalizes understanding of discharge instructions. Opportunity for questioning and answers were provided. Armband removed by staff, pt discharged from ED.  

## 2018-06-03 NOTE — Discharge Instructions (Signed)
You can try phenergan suppository for nausea and vomiting.   Stay hydrated.   Your sodium is low from dehydration and vomiting. Please recheck sodium with your doctor in a week   See your doctor and GI specialist   Return to ER if you have worse abdominal pain, vomiting, fever

## 2018-06-03 NOTE — ED Provider Notes (Signed)
  Physical Exam  BP 135/86   Pulse 99   Temp 98.4 F (36.9 C) (Oral)   Resp 14   Ht 6\' 1"  (1.854 m)   Wt 124.3 kg   SpO2 97%   BMI 36.15 kg/m   Physical Exam  ED Course/Procedures     Procedures  MDM  Care assumed at 4 pm from Dr. Erin Hearing. Patient has hx of diabetic gastroparesis here with vomiting.   This is a chronic problem and patient had been seen previously for similar symptoms.  Labs showed sodium of 125 which is likely from dehydration.  His blood sugar is about 300.  His abdomen is soft and nontender and signed out pending reassessment.  5:12 PM Patient given phenergan, IVF and felt better. Tolerated PO in the ED. Will prescribe phenergan as needed. He has seen multiple GI doctor including a specialist at [facility]. I encouraged him to follow up with GI.      Charlynne Pander, MD 06/03/18 [phone]

## 2018-06-03 NOTE — ED Notes (Signed)
Pt given water and ginger ale for fluid challenge.  

## 2018-06-13 NOTE — ED Provider Notes (Signed)
[facility] EMERGENCY DEPARTMENT Provider Note   CSN: [id number] Arrival date & time: 06/03/18  1211     History   Chief Complaint Chief Complaint  Patient presents with  . Abdominal Pain  . Nausea    HPI Christopher Callahan is a 28 y.o. male.   Abdominal Pain  Pain location:  Generalized Pain quality: aching, bloating and dull   Pain radiates to:  Does not radiate Pain severity:  Mild Onset quality:  Gradual Timing:  Constant Progression:  Worsening Chronicity:  Recurrent Context: retching   Relieved by:  None tried Worsened by:  Nothing Ineffective treatments:  None tried Associated symptoms: nausea and vomiting   Associated symptoms: no chest pain, no chills, no constipation, no cough, no diarrhea, no dysuria, no fever and no shortness of breath     Past Medical History:  Diagnosis Date  . Diabetes mellitus without complication (HCC)   . Gastroparesis     Patient Active Problem List   Diagnosis Date Noted  . Hyponatremia   . Unilateral complete BKA, right, sequela (HCC)   . Poorly controlled type 2 diabetes mellitus with peripheral neuropathy (HCC)   . Hyperkalemia   . AKI (acute kidney injury) (HCC)   . S/P below knee amputation, right (HCC) 09/03/2017  . Drug induced constipation   . Tobacco abuse   . Benign essential HTN   . Type 2 diabetes mellitus with peripheral neuropathy (HCC)   . Acute blood loss anemia   . Post-operative pain   . Subacute osteomyelitis, right ankle and foot (HCC)   . Diabetic foot infection (HCC)   . Sepsis (HCC)   . Severe sepsis (HCC) 08/24/2017  . Essential hypertension 09/04/2016  . Poorly controlled type 1 diabetes mellitus with complication (HCC) 09/03/2016  . Diabetic gastroparesis associated with type 1 diabetes mellitus (HCC) 09/03/2016    Past Surgical History:  Procedure Laterality Date  . AMPUTATION Right 08/27/2017   Procedure: RIGHT BELOW KNEE AMPUTATION;  Surgeon: Nadara Mustarduda, Marcus V, MD;   Location: [facility] OR;  Service: Orthopedics;  Laterality: Right;  . CHOLECYSTECTOMY    . SKIN GRAFT          Home Medications    Prior to Admission medications   Medication Sig Start Date End Date Taking? Authorizing Provider  insulin aspart (NOVOLOG FLEXPEN) 100 UNIT/ML FlexPen Inject 10 Units into the skin 3 (three) times daily with meals. 09/11/17   Angiulli, Mcarthur Rossettianiel J, PA-C  Insulin Detemir (LEVEMIR FLEXTOUCH) 100 UNIT/ML Pen Inject 24 Units into the skin 2 (two) times daily. 09/11/17   Angiulli, Mcarthur Rossettianiel J, PA-C  lisinopril (PRINIVIL,ZESTRIL) 5 MG tablet Take 5 mg by mouth daily.    [provider]  promethazine (PHENERGAN) 25 MG suppository Place 1 suppository (25 mg total) rectally every 6 (six) hours as needed for nausea or vomiting. 06/03/18   Charlynne PanderYao, David Hsienta, MD  silver sulfADIAZINE (SILVADENE) 1 % cream Apply 1 application topically daily. 05/22/18   Rayburn, Fanny BienShawn Montgomery, PA-C    Family History Family History  Problem Relation Age of Onset  . COPD Father     Social History Social History   Tobacco Use  . Smoking status: Current Every Day Smoker    Packs/day: 0.30    Types: Cigarettes  . Smokeless tobacco: Never Used  Substance Use Topics  . Alcohol use: No  . Drug use: No     Allergies   Patient has no known allergies.   Review of Systems Review of  Systems  Constitutional: Negative for activity change, chills and fever.  HENT: Negative for congestion and rhinorrhea.   Eyes: Negative for visual disturbance.  Respiratory: Negative for cough and shortness of breath.   Cardiovascular: Negative for chest pain.  Gastrointestinal: Positive for abdominal pain, nausea and vomiting. Negative for constipation and diarrhea.  Endocrine: Negative for polyuria.  Genitourinary: Negative for dysuria and flank pain.  Musculoskeletal: Negative for back pain and neck pain.  Skin: Negative for wound.  Neurological: Negative for headaches.  All other systems reviewed  and are negative.    Physical Exam Updated Vital Signs BP 133/87 (BP Location: Right Arm)   Pulse (!) 102   Temp 98.4 F (36.9 C) (Oral)   Resp 16   Ht 6\' 1"  (1.854 m)   Wt 124.3 kg   SpO2 100%   BMI 36.15 kg/m   Physical Exam Vitals signs and nursing note reviewed.  Constitutional:      Appearance: He is well-developed.  HENT:     Head: Normocephalic and atraumatic.  Neck:     Musculoskeletal: Normal range of motion.  Cardiovascular:     Rate and Rhythm: Tachycardia present.  Pulmonary:     Effort: Pulmonary effort is normal. No respiratory distress.  Abdominal:     General: Bowel sounds are normal. There is no distension.     Tenderness: There is no abdominal tenderness. There is no right CVA tenderness, left CVA tenderness, guarding or rebound.  Musculoskeletal: Normal range of motion.  Skin:    General: Skin is warm and dry.  Neurological:     General: No focal deficit present.     Mental Status: He is alert.      ED Treatments / Results  Labs (all labs ordered are listed, but only abnormal results are displayed) Labs Reviewed  I-STAT CHEM 8, ED - Abnormal; Notable for the following components:      Result Value   Sodium 125 (*)    Chloride 92 (*)    BUN 22 (*)    Glucose, Bld 302 (*)    Calcium, Ion 1.13 (*)    All other components within normal limits    EKG None  Radiology No results found.  Procedures Procedures (including critical care time)  Medications Ordered in ED Medications  lactated ringers bolus 1,000 mL (0 mLs Intravenous Stopped 06/03/18 1630)  promethazine (PHENERGAN) injection 25 mg (25 mg Intravenous Given 06/03/18 1434)  HYDROmorphone (DILAUDID) injection 1 mg (1 mg Intravenous Given 06/03/18 1433)  lactated ringers bolus 1,000 mL (0 mLs Intravenous Stopped 06/03/18 1814)     Initial Impression / Assessment and Plan / ED Course  I have reviewed the triage vital signs and the nursing notes.  Pertinent labs & imaging results  that were available during my care of the patient were reviewed by me and considered in my medical decision making (see chart for details).     Here with likely diabetic gastroparesis.  Will treat with supportive care at this time.  Low suspicion for any intra-abdominal causes as this feels exactly the same as his previous ones.  Transferred pending reevaluation for improvement of symptoms I suspect patient will be able to be discharged.  Final Clinical Impressions(s) / ED Diagnoses   Final diagnoses:  Non-intractable vomiting with nausea, unspecified vomiting type  Hyperglycemia  Hyponatremia    ED Discharge Orders         Ordered    promethazine (PHENERGAN) 25 MG suppository  Every 6 hours  PRN     06/03/18 1713           Morty Ortwein, Barbara CowerJason, MD 06/13/18 [phone]

## 2019-09-25 IMAGING — CR DG FOOT COMPLETE 3+V*R*
3 series · 3 of 3 positions shown · non-contrast
Comparison: None.

CLINICAL DATA: Diabetic foot ulcer 1 month.

EXAM:
RIGHT FOOT COMPLETE - 3+ VIEW

[foot ap]
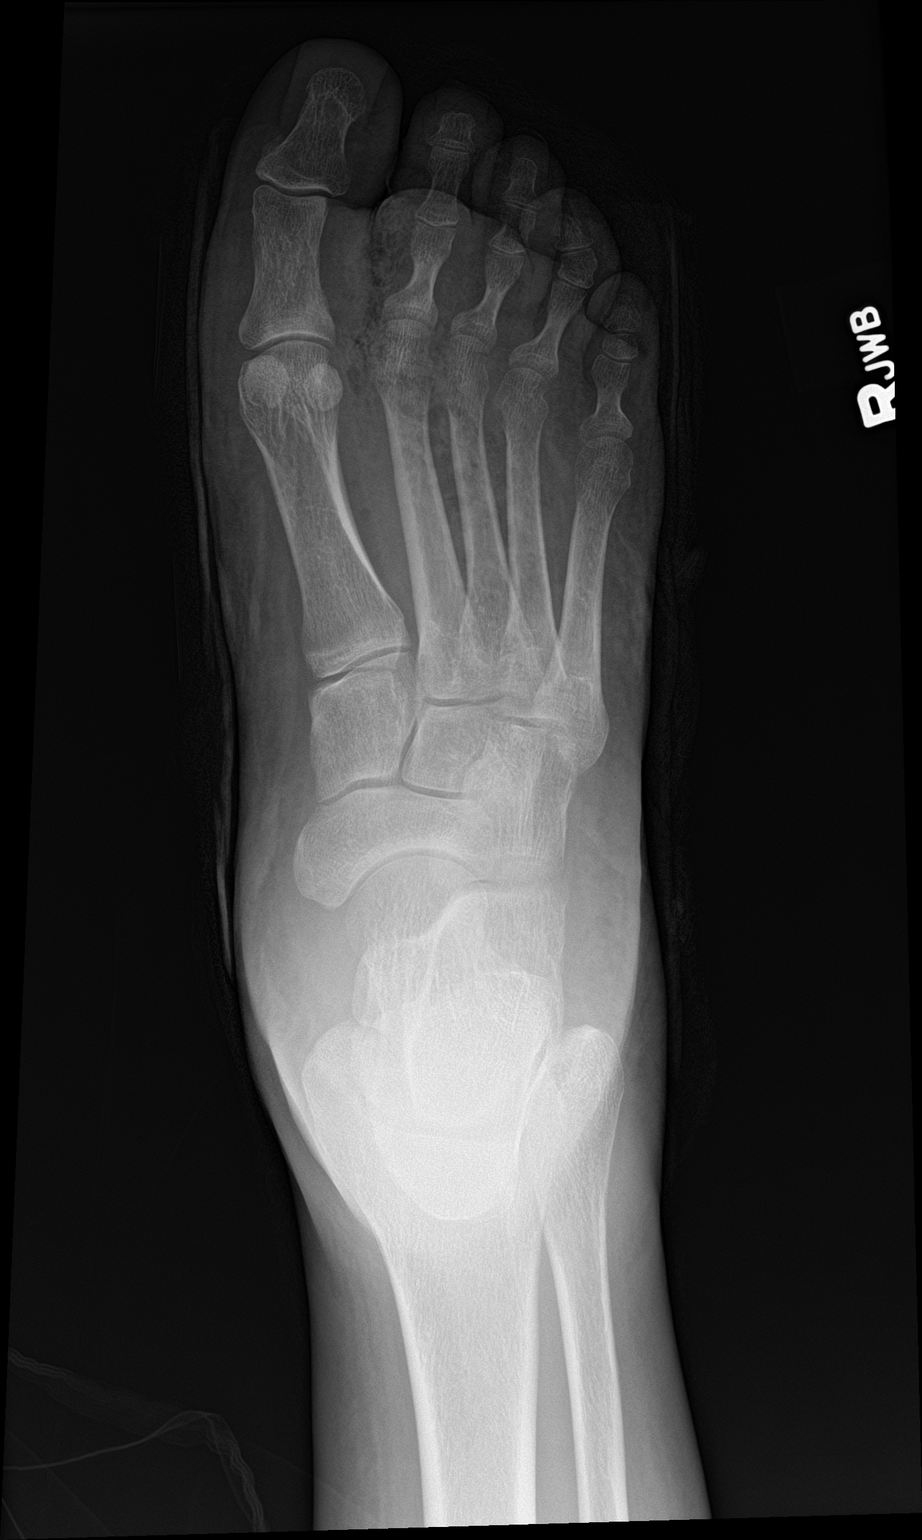

[foot obl]
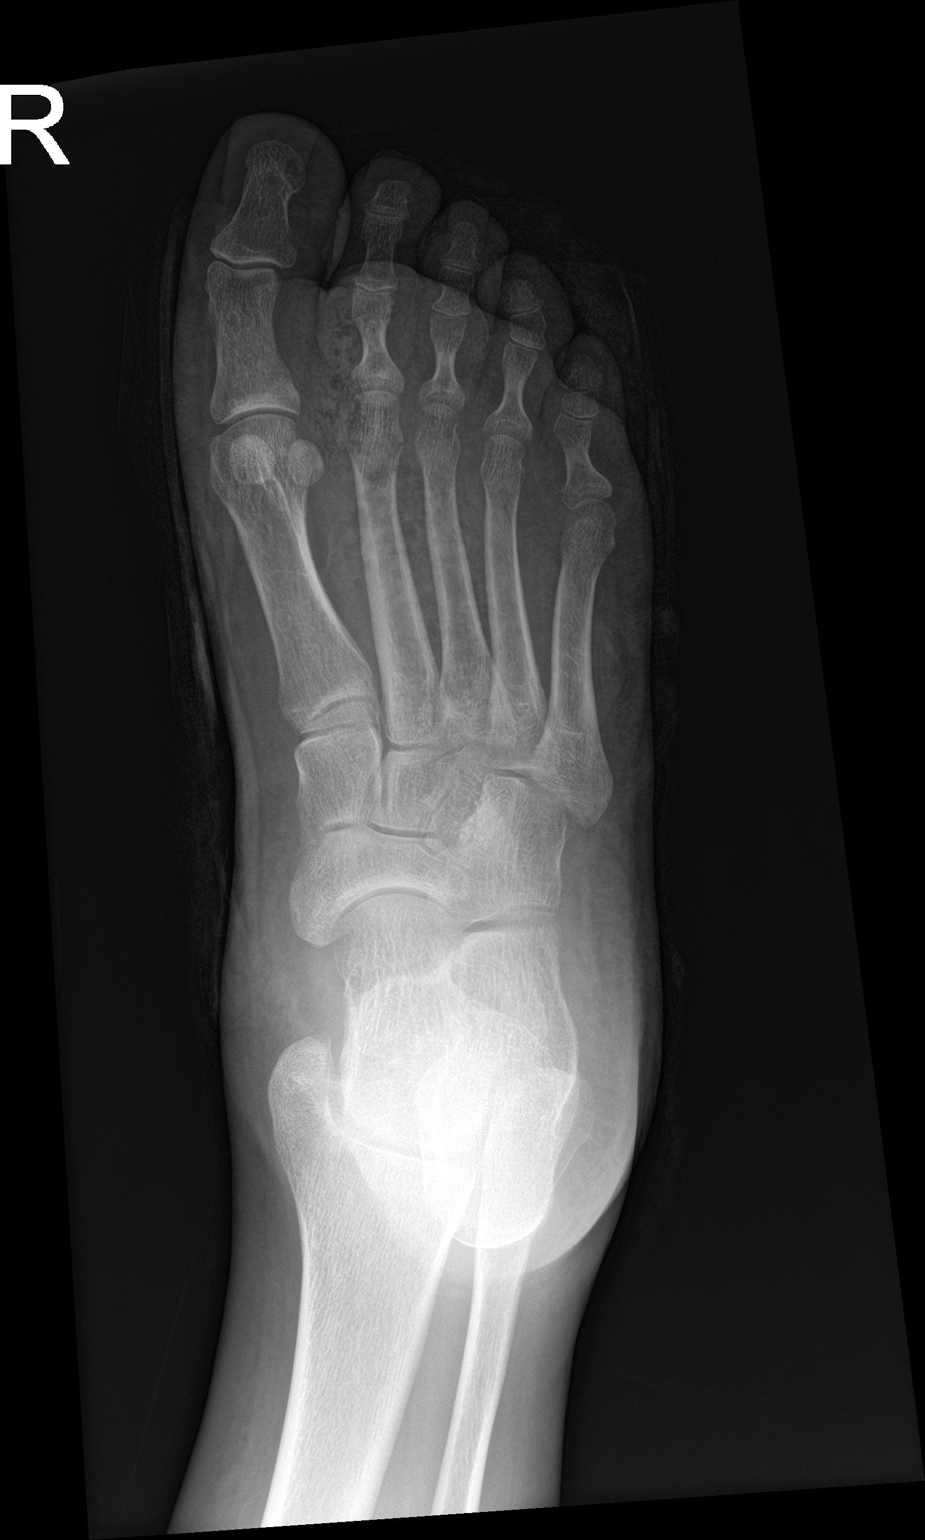

[foot lat]
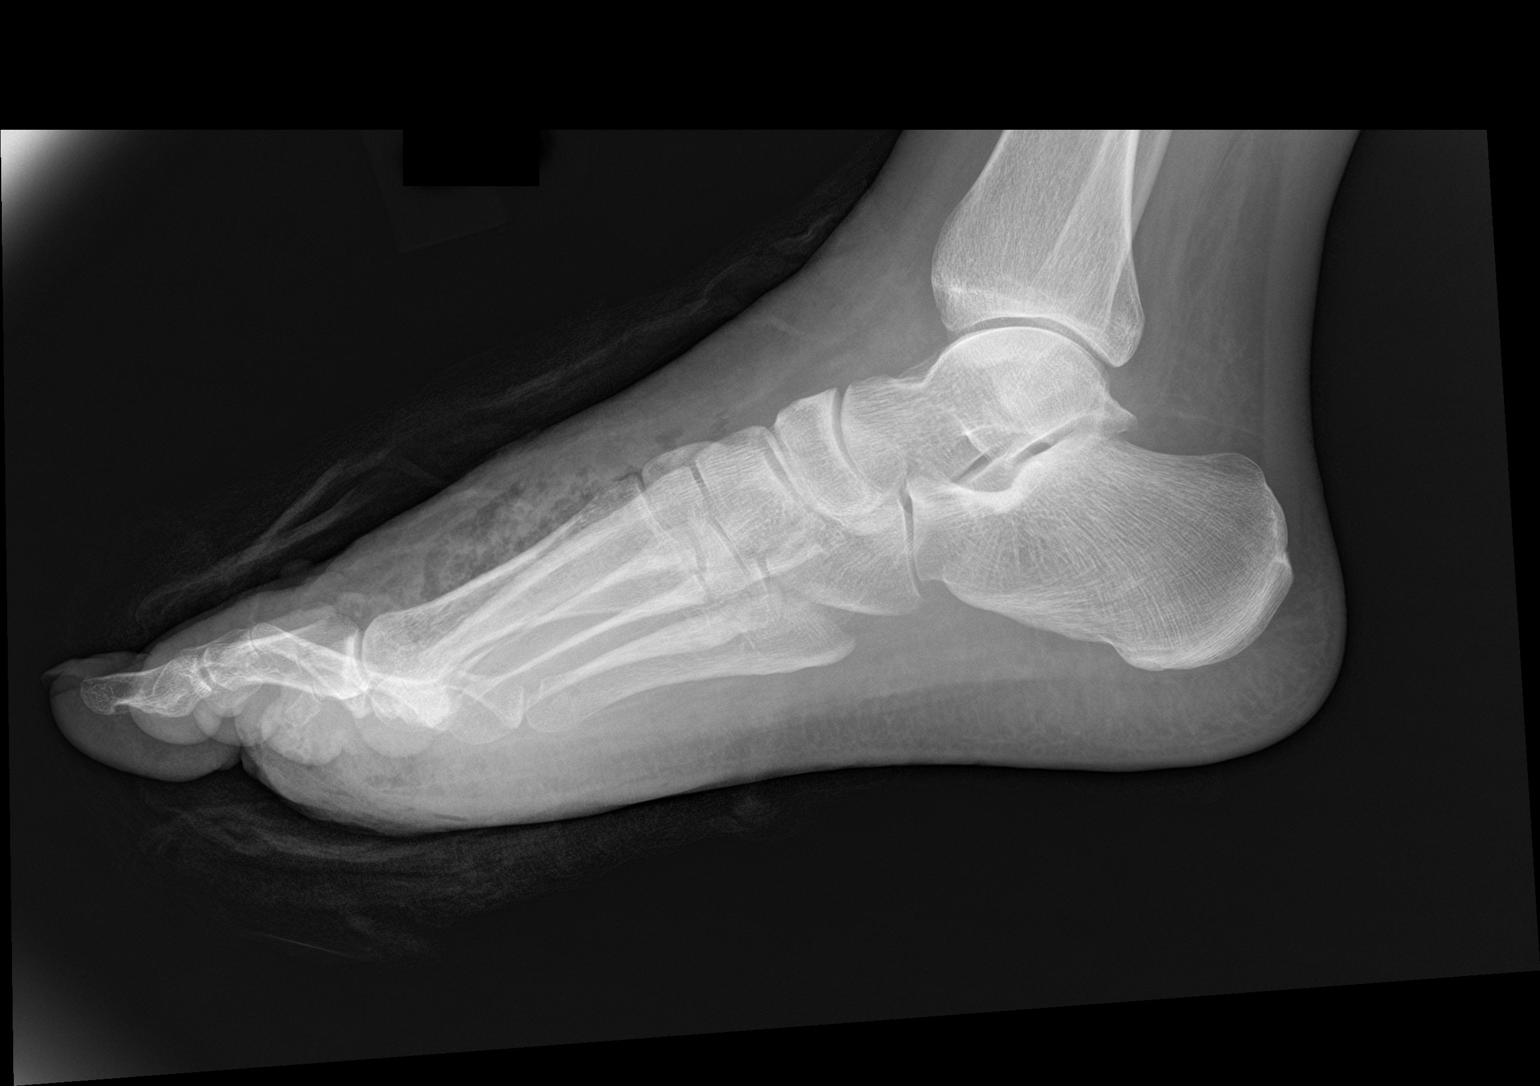

[3 of 3 positions shown; findings below may reference images not displayed]

FINDINGS: Examination demonstrates mottled air collection within th plantar
and dorsal soft tissues adjacent the metatarsals and level of the
proximal phalanges most prominent adjacent the second and third
toes. Minimal increased lucency over the head of the second and
third metatarsals, although no definite bone destruction. No
evidence of fracture or dislocation.
IMPRESSION: Moderate mottled air collection over the plantar and dorsal aspect
of the mid to forefoot region worse adjacent the second and third
toes likely soft tissue infection. Possible increased lucency over
the head of the second and third metatarsals as osteomyelitis is
possible.

## 2020-04-27 IMAGING — DX DG CHEST 2V
2 series · 2 of 2 positions shown · non-contrast
Comparison: 09/03/2016

CLINICAL DATA: Chest pain and emesis

EXAM:
CHEST - 2 VIEW

[chest ap]
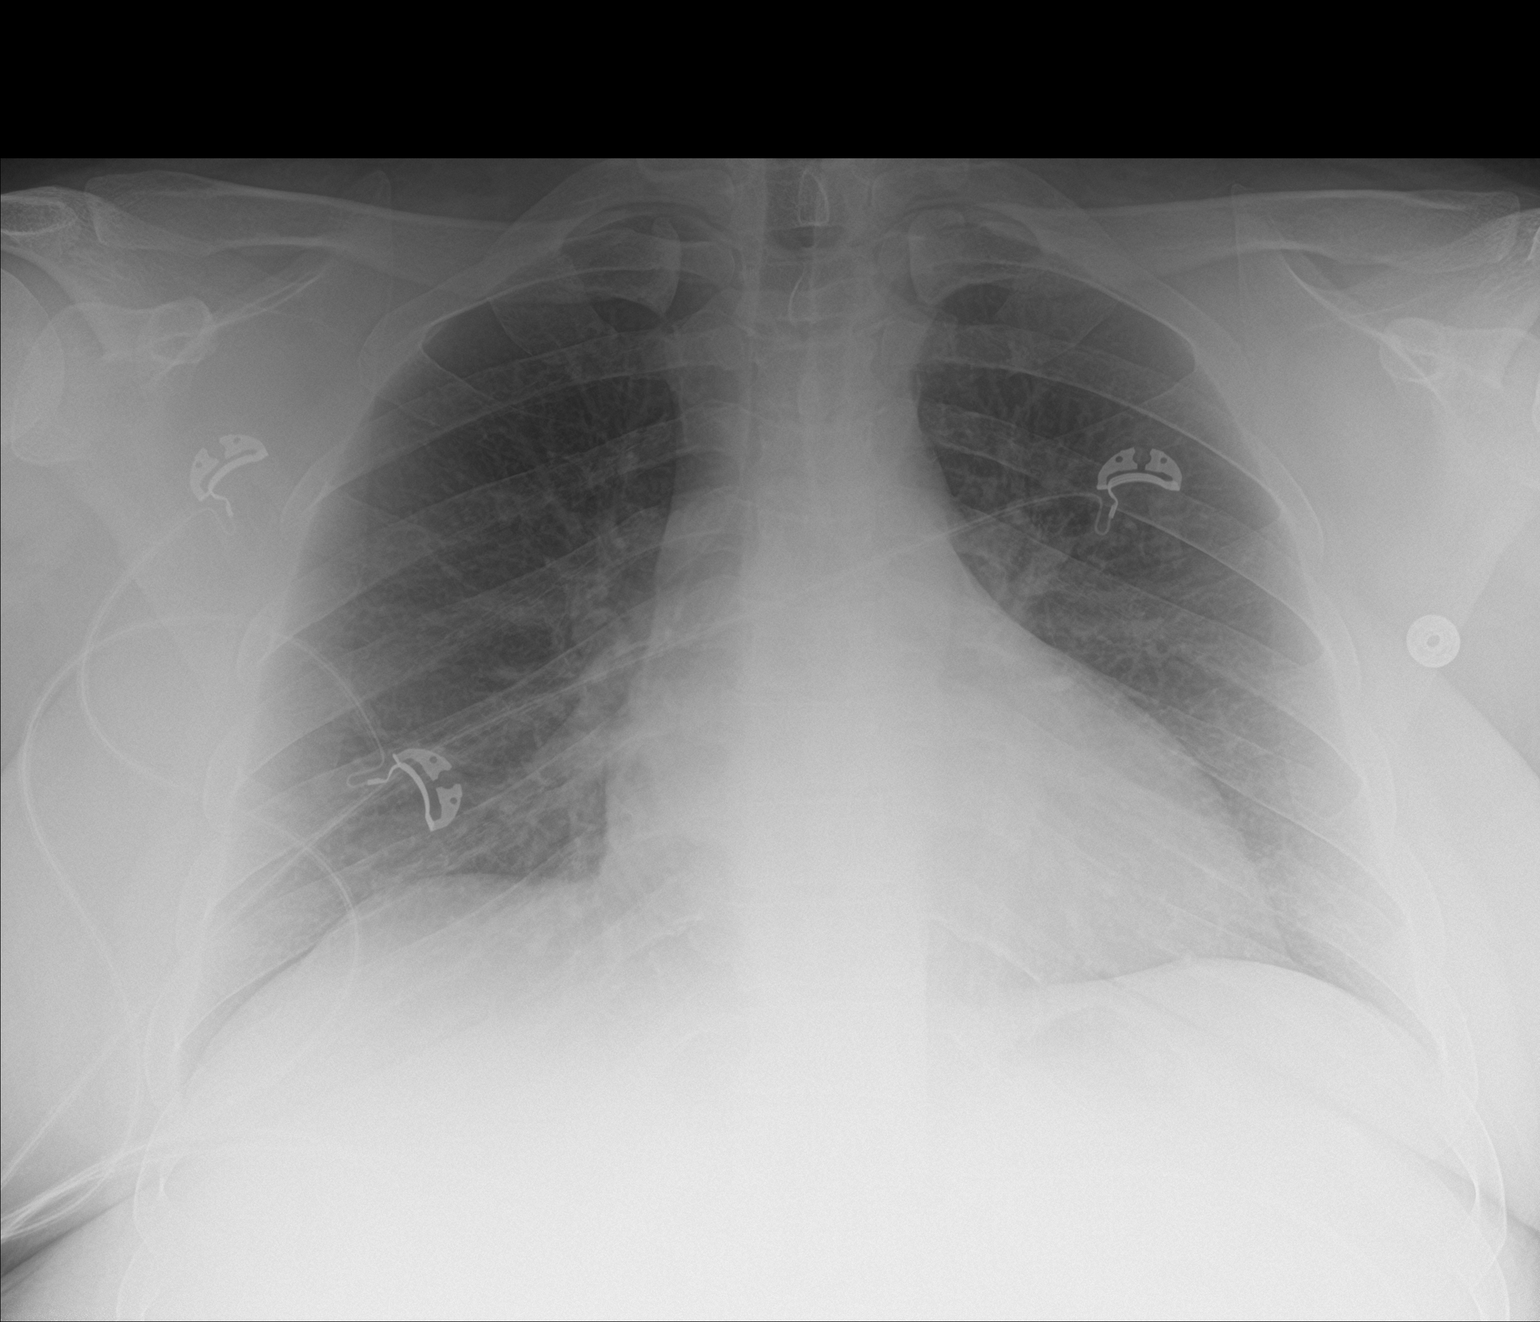

[chest lat]
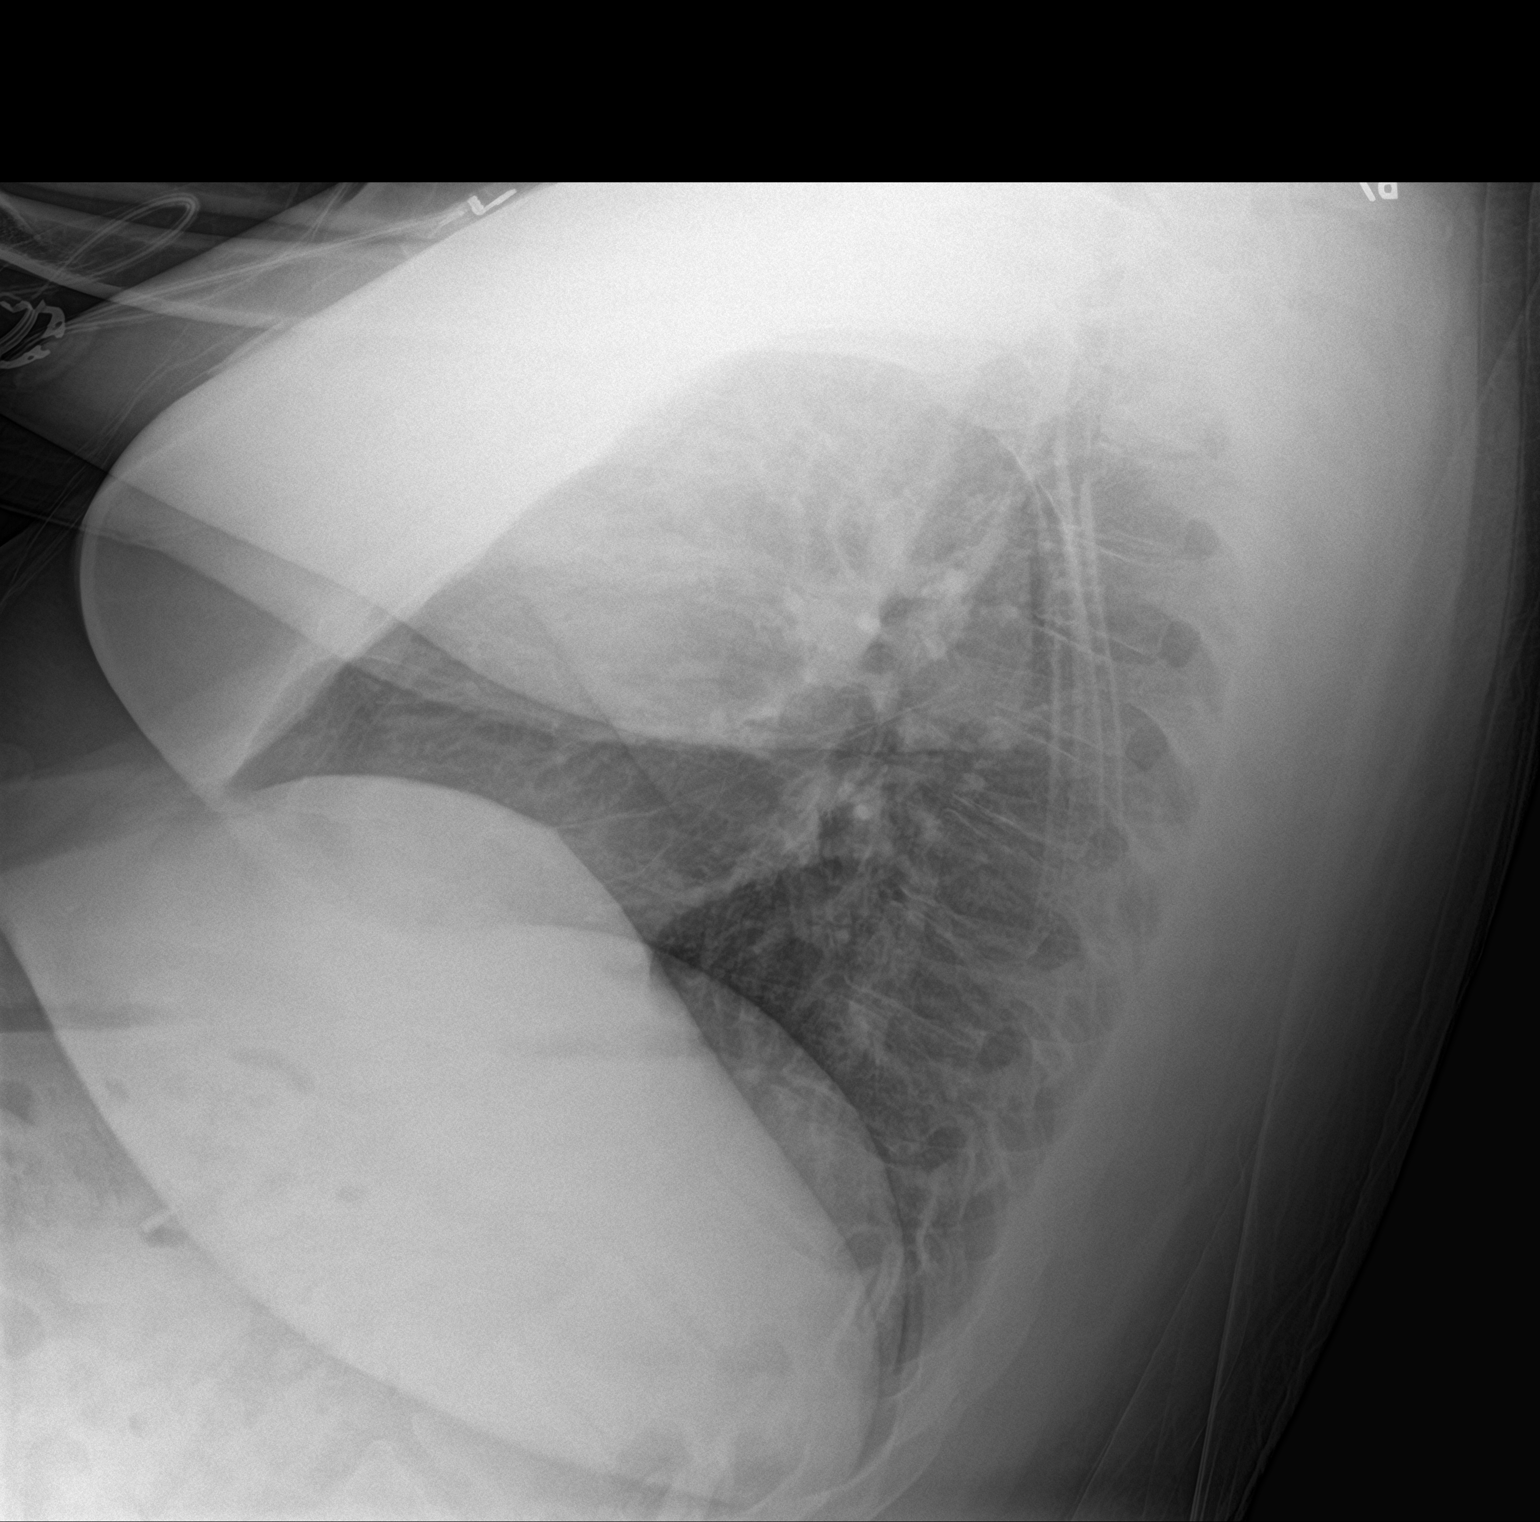

[2 of 2 positions shown; findings below may reference images not displayed]

FINDINGS: The heart size and mediastinal contours are within normal limits.
Both lungs are clear. The visualized skeletal structures are
unremarkable.
IMPRESSION: No active cardiopulmonary disease.

## 2023-01-02 DEATH — deceased
